# Patient Record
Sex: Male | Born: 1965
Health system: Southern US, Community
[De-identification: ages and names within clinical notes are randomized; demographics above are authoritative.]

## PROBLEM LIST (undated history)

## (undated) DIAGNOSIS — I161 Hypertensive emergency: Secondary | ICD-10-CM

## (undated) DIAGNOSIS — D352 Benign neoplasm of pituitary gland: Secondary | ICD-10-CM

## (undated) DIAGNOSIS — E669 Obesity, unspecified: Secondary | ICD-10-CM

## (undated) DIAGNOSIS — E118 Type 2 diabetes mellitus with unspecified complications: Secondary | ICD-10-CM

## (undated) DIAGNOSIS — I428 Other cardiomyopathies: Secondary | ICD-10-CM

## (undated) DIAGNOSIS — I639 Cerebral infarction, unspecified: Secondary | ICD-10-CM

## (undated) DIAGNOSIS — I509 Heart failure, unspecified: Secondary | ICD-10-CM

## (undated) DIAGNOSIS — E220 Acromegaly and pituitary gigantism: Secondary | ICD-10-CM

## (undated) DIAGNOSIS — I5020 Unspecified systolic (congestive) heart failure: Secondary | ICD-10-CM

## (undated) DIAGNOSIS — I10 Essential (primary) hypertension: Secondary | ICD-10-CM

## (undated) HISTORY — DX: Unspecified systolic (congestive) heart failure: I50.20

## (undated) HISTORY — DX: Obesity, unspecified: E66.9

## (undated) HISTORY — DX: Essential (primary) hypertension: I10

## (undated) HISTORY — DX: Cerebral infarction, unspecified: I63.9

## (undated) HISTORY — DX: Type 2 diabetes mellitus with unspecified complications: E11.8

---
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
## 2011-10-18 DIAGNOSIS — E119 Type 2 diabetes mellitus without complications: Secondary | ICD-10-CM | POA: Insufficient documentation

## 2011-10-18 DIAGNOSIS — E669 Obesity, unspecified: Secondary | ICD-10-CM | POA: Insufficient documentation

## 2012-06-10 DIAGNOSIS — I10 Essential (primary) hypertension: Secondary | ICD-10-CM | POA: Insufficient documentation

## 2017-08-21 DIAGNOSIS — R5383 Other fatigue: Secondary | ICD-10-CM | POA: Diagnosis not present

## 2017-08-21 DIAGNOSIS — R972 Elevated prostate specific antigen [PSA]: Secondary | ICD-10-CM | POA: Diagnosis not present

## 2017-08-21 DIAGNOSIS — E785 Hyperlipidemia, unspecified: Secondary | ICD-10-CM | POA: Diagnosis not present

## 2017-08-21 DIAGNOSIS — E119 Type 2 diabetes mellitus without complications: Secondary | ICD-10-CM | POA: Diagnosis not present

## 2017-08-21 DIAGNOSIS — I10 Essential (primary) hypertension: Secondary | ICD-10-CM | POA: Diagnosis not present

## 2017-12-27 DIAGNOSIS — L570 Actinic keratosis: Secondary | ICD-10-CM | POA: Diagnosis not present

## 2018-09-20 DIAGNOSIS — E119 Type 2 diabetes mellitus without complications: Secondary | ICD-10-CM | POA: Diagnosis not present

## 2018-12-15 ENCOUNTER — Encounter (HOSPITAL_COMMUNITY): Payer: Self-pay | Admitting: Emergency Medicine

## 2018-12-15 ENCOUNTER — Encounter (HOSPITAL_COMMUNITY)
Admission: EM | Disposition: A | Discharge status: Home / Self Care | Payer: Self-pay | Source: Home / Self Care | Attending: Internal Medicine

## 2018-12-15 ENCOUNTER — Other Ambulatory Visit: Payer: Self-pay

## 2018-12-15 ENCOUNTER — Emergency Department (HOSPITAL_COMMUNITY): Payer: BC Managed Care – PPO

## 2018-12-15 ENCOUNTER — Inpatient Hospital Stay (HOSPITAL_COMMUNITY): Payer: BC Managed Care – PPO

## 2018-12-15 ENCOUNTER — Inpatient Hospital Stay (HOSPITAL_COMMUNITY)
Admission: EM | Admit: 2018-12-15 | Discharge: 2018-12-17 | DRG: 286 | Disposition: A | Discharge status: Home / Self Care | Payer: BC Managed Care – PPO | Attending: Cardiovascular Disease | Admitting: Cardiovascular Disease

## 2018-12-15 DIAGNOSIS — I428 Other cardiomyopathies: Secondary | ICD-10-CM | POA: Diagnosis not present

## 2018-12-15 DIAGNOSIS — I110 Hypertensive heart disease with heart failure: Secondary | ICD-10-CM | POA: Diagnosis present

## 2018-12-15 DIAGNOSIS — R9431 Abnormal electrocardiogram [ECG] [EKG]: Secondary | ICD-10-CM

## 2018-12-15 DIAGNOSIS — I493 Ventricular premature depolarization: Secondary | ICD-10-CM | POA: Diagnosis not present

## 2018-12-15 DIAGNOSIS — I351 Nonrheumatic aortic (valve) insufficiency: Secondary | ICD-10-CM | POA: Diagnosis present

## 2018-12-15 DIAGNOSIS — I447 Left bundle-branch block, unspecified: Secondary | ICD-10-CM | POA: Diagnosis present

## 2018-12-15 DIAGNOSIS — R0902 Hypoxemia: Secondary | ICD-10-CM | POA: Diagnosis not present

## 2018-12-15 DIAGNOSIS — R05 Cough: Secondary | ICD-10-CM | POA: Diagnosis not present

## 2018-12-15 DIAGNOSIS — Z8249 Family history of ischemic heart disease and other diseases of the circulatory system: Secondary | ICD-10-CM | POA: Diagnosis not present

## 2018-12-15 DIAGNOSIS — R069 Unspecified abnormalities of breathing: Secondary | ICD-10-CM | POA: Diagnosis not present

## 2018-12-15 DIAGNOSIS — I5021 Acute systolic (congestive) heart failure: Secondary | ICD-10-CM | POA: Diagnosis present

## 2018-12-15 DIAGNOSIS — I161 Hypertensive emergency: Secondary | ICD-10-CM

## 2018-12-15 DIAGNOSIS — I213 ST elevation (STEMI) myocardial infarction of unspecified site: Secondary | ICD-10-CM | POA: Diagnosis not present

## 2018-12-15 DIAGNOSIS — J810 Acute pulmonary edema: Secondary | ICD-10-CM | POA: Diagnosis not present

## 2018-12-15 DIAGNOSIS — Z20828 Contact with and (suspected) exposure to other viral communicable diseases: Secondary | ICD-10-CM | POA: Diagnosis not present

## 2018-12-15 DIAGNOSIS — R0602 Shortness of breath: Secondary | ICD-10-CM | POA: Diagnosis not present

## 2018-12-15 DIAGNOSIS — T68XXXA Hypothermia, initial encounter: Secondary | ICD-10-CM | POA: Diagnosis not present

## 2018-12-15 DIAGNOSIS — I10 Essential (primary) hypertension: Secondary | ICD-10-CM | POA: Diagnosis not present

## 2018-12-15 DIAGNOSIS — I5022 Chronic systolic (congestive) heart failure: Secondary | ICD-10-CM

## 2018-12-15 DIAGNOSIS — Z03818 Encounter for observation for suspected exposure to other biological agents ruled out: Secondary | ICD-10-CM | POA: Diagnosis not present

## 2018-12-15 DIAGNOSIS — Z1159 Encounter for screening for other viral diseases: Secondary | ICD-10-CM

## 2018-12-15 DIAGNOSIS — E1165 Type 2 diabetes mellitus with hyperglycemia: Secondary | ICD-10-CM | POA: Diagnosis not present

## 2018-12-15 DIAGNOSIS — Z87891 Personal history of nicotine dependence: Secondary | ICD-10-CM

## 2018-12-15 DIAGNOSIS — I214 Non-ST elevation (NSTEMI) myocardial infarction: Secondary | ICD-10-CM | POA: Diagnosis not present

## 2018-12-15 DIAGNOSIS — Z7984 Long term (current) use of oral hypoglycemic drugs: Secondary | ICD-10-CM | POA: Diagnosis not present

## 2018-12-15 DIAGNOSIS — R000 Tachycardia, unspecified: Secondary | ICD-10-CM | POA: Diagnosis not present

## 2018-12-15 HISTORY — DX: Hypertensive emergency: I16.1

## 2018-12-15 LAB — CBC WITH DIFFERENTIAL/PLATELET
Abs Immature Granulocytes: 0.03 10*3/uL (ref 0.00–0.07)
Basophils Absolute: 0 10*3/uL (ref 0.0–0.1)
Basophils Relative: 0 %
Eosinophils Absolute: 0 10*3/uL (ref 0.0–0.5)
Eosinophils Relative: 0 %
HCT: 47.1 % (ref 39.0–52.0)
Hemoglobin: 15.6 g/dL (ref 13.0–17.0)
Immature Granulocytes: 0 %
Lymphocytes Relative: 12 %
Lymphs Abs: 1.4 10*3/uL (ref 0.7–4.0)
MCH: 30.4 pg (ref 26.0–34.0)
MCHC: 33.1 g/dL (ref 30.0–36.0)
MCV: 91.6 fL (ref 80.0–100.0)
Monocytes Absolute: 0.7 10*3/uL (ref 0.1–1.0)
Monocytes Relative: 6 %
Neutro Abs: 9.2 10*3/uL — ABNORMAL HIGH (ref 1.7–7.7)
Neutrophils Relative %: 82 %
Platelets: 203 10*3/uL (ref 150–400)
RBC: 5.14 MIL/uL (ref 4.22–5.81)
RDW: 12.7 % (ref 11.5–15.5)
WBC: 11.4 10*3/uL — ABNORMAL HIGH (ref 4.0–10.5)
nRBC: 0 % (ref 0.0–0.2)

## 2018-12-15 LAB — COMPREHENSIVE METABOLIC PANEL
ALT: 22 U/L (ref 0–44)
AST: 22 U/L (ref 15–41)
Albumin: 3.7 g/dL (ref 3.5–5.0)
Alkaline Phosphatase: 98 U/L (ref 38–126)
Anion gap: 13 (ref 5–15)
BUN: 9 mg/dL (ref 6–20)
CO2: 24 mmol/L (ref 22–32)
Calcium: 9.3 mg/dL (ref 8.9–10.3)
Chloride: 99 mmol/L (ref 98–111)
Creatinine, Ser: 1.07 mg/dL (ref 0.61–1.24)
GFR calc Af Amer: >60 mL/min (ref >60)
GFR calc non Af Amer: >60 mL/min (ref >60)
Glucose, Bld: 411 mg/dL — ABNORMAL HIGH (ref 70–99)
Potassium: 4.5 mmol/L (ref 3.5–5.1)
Sodium: 136 mmol/L (ref 135–145)
Total Bilirubin: 0.8 mg/dL (ref 0.3–1.2)
Total Protein: 6.8 g/dL (ref 6.5–8.1)

## 2018-12-15 LAB — APTT: aPTT: 31 seconds (ref 24–36)

## 2018-12-15 LAB — ECHOCARDIOGRAM COMPLETE
Height: 75 in
Weight: 3746.06 oz

## 2018-12-15 LAB — HEMOGLOBIN A1C
Hgb A1c MFr Bld: 12.7 % — ABNORMAL HIGH (ref 4.8–5.6)
Mean Plasma Glucose: 317.79 mg/dL

## 2018-12-15 LAB — TROPONIN I (HIGH SENSITIVITY)
Troponin I (High Sensitivity): 55 ng/L — ABNORMAL HIGH (ref <18)
Troponin I (High Sensitivity): 102 ng/L (ref <18)

## 2018-12-15 LAB — PROTIME-INR
INR: 0.9 (ref 0.8–1.2)
Prothrombin Time: 12.5 seconds (ref 11.4–15.2)

## 2018-12-15 LAB — LIPID PANEL
Cholesterol: 192 mg/dL (ref 0–200)
HDL: 52 mg/dL (ref >40)
LDL Cholesterol: 81 mg/dL (ref 0–99)
Total CHOL/HDL Ratio: 3.7 RATIO
Triglycerides: 293 mg/dL — ABNORMAL HIGH (ref <150)
VLDL: 59 mg/dL — ABNORMAL HIGH (ref 0–40)

## 2018-12-15 LAB — GLUCOSE, CAPILLARY
Glucose-Capillary: 386 mg/dL — ABNORMAL HIGH (ref 70–99)
Glucose-Capillary: 312 mg/dL — ABNORMAL HIGH (ref 70–99)
Glucose-Capillary: 350 mg/dL — ABNORMAL HIGH (ref 70–99)
Glucose-Capillary: 292 mg/dL — ABNORMAL HIGH (ref 70–99)

## 2018-12-15 LAB — MRSA PCR SCREENING: MRSA by PCR: NEGATIVE

## 2018-12-15 LAB — BRAIN NATRIURETIC PEPTIDE: B Natriuretic Peptide: 236 pg/mL — ABNORMAL HIGH (ref 0.0–100.0)

## 2018-12-15 LAB — HIV ANTIBODY (ROUTINE TESTING W REFLEX): HIV Screen 4th Generation wRfx: NONREACTIVE

## 2018-12-15 LAB — MAGNESIUM: Magnesium: 1.4 mg/dL — ABNORMAL LOW (ref 1.7–2.4)

## 2018-12-15 LAB — TSH: TSH: 0.789 u[IU]/mL (ref 0.350–4.500)

## 2018-12-15 LAB — SARS CORONAVIRUS 2 BY RT PCR (HOSPITAL ORDER, PERFORMED IN ~~LOC~~ HOSPITAL LAB): SARS Coronavirus 2: NEGATIVE

## 2018-12-15 SURGERY — INVASIVE LAB ABORTED CASE

## 2018-12-15 MED ORDER — ONDANSETRON HCL 4 MG/2ML IJ SOLN: 4.0000 mg | Freq: Four times a day (QID) | INTRAMUSCULAR | Status: DC | PRN

## 2018-12-15 MED ORDER — INSULIN ASPART 100 UNIT/ML ~~LOC~~ SOLN
0.0000 [IU] | Freq: Every day | SUBCUTANEOUS | Status: DC
  Administered 2018-12-15: 3 [IU] via SUBCUTANEOUS
  Administered 2018-12-16: 2 [IU] via SUBCUTANEOUS

## 2018-12-15 MED ORDER — NITROGLYCERIN IN D5W 200-5 MCG/ML-% IV SOLN
INTRAVENOUS | Status: AC
  Filled 2018-12-15: qty 250

## 2018-12-15 MED ORDER — GLIPIZIDE 10 MG PO TABS
10.0000 mg | ORAL_TABLET | Freq: Two times a day (BID) | ORAL | Status: DC
  Administered 2018-12-15 – 2018-12-17 (×4): 10 mg via ORAL
  Filled 2018-12-15 (×5): qty 1

## 2018-12-15 MED ORDER — SODIUM CHLORIDE 0.9% FLUSH
3.0000 mL | Freq: Two times a day (BID) | INTRAVENOUS | Status: DC
  Administered 2018-12-15 – 2018-12-16 (×2): 3 mL via INTRAVENOUS

## 2018-12-15 MED ORDER — SODIUM CHLORIDE 0.9% FLUSH
3.0000 mL | Freq: Two times a day (BID) | INTRAVENOUS | Status: DC
  Administered 2018-12-15: 3 mL via INTRAVENOUS

## 2018-12-15 MED ORDER — SODIUM CHLORIDE 0.9 % IV SOLN: 250.0000 mL | INTRAVENOUS | Status: DC | PRN

## 2018-12-15 MED ORDER — HEPARIN (PORCINE) IN NACL 1000-0.9 UT/500ML-% IV SOLN
INTRAVENOUS | Status: AC
  Filled 2018-12-15: qty 1000

## 2018-12-15 MED ORDER — SODIUM CHLORIDE 0.9 % IV SOLN
INTRAVENOUS | Status: DC
  Administered 2018-12-16: 05:00:00 via INTRAVENOUS

## 2018-12-15 MED ORDER — LIDOCAINE HCL (PF) 1 % IJ SOLN
INTRAMUSCULAR | Status: AC
  Filled 2018-12-15: qty 30

## 2018-12-15 MED ORDER — MAGNESIUM SULFATE 4 GM/100ML IV SOLN
4.0000 g | Freq: Once | INTRAVENOUS | Status: AC
  Administered 2018-12-15: 4 g via INTRAVENOUS
  Filled 2018-12-15: qty 100

## 2018-12-15 MED ORDER — NITROGLYCERIN IN D5W 200-5 MCG/ML-% IV SOLN
0.0000 ug/min | INTRAVENOUS | Status: DC
  Administered 2018-12-15: 20 ug/min via INTRAVENOUS

## 2018-12-15 MED ORDER — SACUBITRIL-VALSARTAN 49-51 MG PO TABS
1.0000 | ORAL_TABLET | Freq: Two times a day (BID) | ORAL | Status: DC
  Administered 2018-12-15 – 2018-12-17 (×5): 1 via ORAL
  Filled 2018-12-15 (×6): qty 1

## 2018-12-15 MED ORDER — FUROSEMIDE 10 MG/ML IJ SOLN
40.0000 mg | Freq: Two times a day (BID) | INTRAMUSCULAR | Status: DC
  Administered 2018-12-15: 40 mg via INTRAVENOUS
  Filled 2018-12-15: qty 4

## 2018-12-15 MED ORDER — SODIUM CHLORIDE 0.9% FLUSH: 3.0000 mL | INTRAVENOUS | Status: DC | PRN

## 2018-12-15 MED ORDER — ATORVASTATIN CALCIUM 80 MG PO TABS
80.0000 mg | ORAL_TABLET | Freq: Every day | ORAL | Status: DC
  Administered 2018-12-15 – 2018-12-16 (×2): 80 mg via ORAL
  Filled 2018-12-15 (×2): qty 1

## 2018-12-15 MED ORDER — ASPIRIN 81 MG PO CHEW
81.0000 mg | CHEWABLE_TABLET | ORAL | Status: AC
  Administered 2018-12-16: 81 mg via ORAL
  Filled 2018-12-15: qty 1

## 2018-12-15 MED ORDER — SPIRONOLACTONE 12.5 MG HALF TABLET
12.5000 mg | ORAL_TABLET | Freq: Every day | ORAL | Status: DC
  Administered 2018-12-15 – 2018-12-17 (×3): 12.5 mg via ORAL
  Filled 2018-12-15 (×3): qty 1

## 2018-12-15 MED ORDER — ACETAMINOPHEN 325 MG PO TABS: 650.0000 mg | ORAL_TABLET | ORAL | Status: DC | PRN

## 2018-12-15 MED ORDER — INSULIN ASPART 100 UNIT/ML ~~LOC~~ SOLN
0.0000 [IU] | Freq: Three times a day (TID) | SUBCUTANEOUS | Status: DC
  Administered 2018-12-15 – 2018-12-16 (×5): 11 [IU] via SUBCUTANEOUS
  Administered 2018-12-17 (×2): 8 [IU] via SUBCUTANEOUS

## 2018-12-15 MED ORDER — HEPARIN SODIUM (PORCINE) 5000 UNIT/ML IJ SOLN
5000.0000 [IU] | Freq: Three times a day (TID) | INTRAMUSCULAR | Status: DC
  Administered 2018-12-15 – 2018-12-17 (×6): 5000 [IU] via SUBCUTANEOUS
  Filled 2018-12-15 (×6): qty 1

## 2018-12-15 MED ORDER — VERAPAMIL HCL 2.5 MG/ML IV SOLN
INTRAVENOUS | Status: AC
  Filled 2018-12-15: qty 2

## 2018-12-15 SURGICAL SUPPLY — 9 items
CATH 5FR JL3.5 JR4 ANG PIG MP (CATHETERS) ×4 IMPLANT
GLIDESHEATH SLEND SS 6F .021 (SHEATH) ×4 IMPLANT
GUIDEWIRE INQWIRE 1.5J.035X260 (WIRE) ×2 IMPLANT
INQWIRE 1.5J .035X260CM (WIRE) ×4
KIT ENCORE 26 ADVANTAGE (KITS) ×4 IMPLANT
KIT HEART LEFT (KITS) ×4 IMPLANT
PACK CARDIAC CATHETERIZATION (CUSTOM PROCEDURE TRAY) ×4 IMPLANT
TRANSDUCER W/STOPCOCK (MISCELLANEOUS) ×4 IMPLANT
TUBING CIL FLEX 10 FLL-RA (TUBING) ×4 IMPLANT

## 2018-12-15 NOTE — Progress Notes (Addendum)
   Notes from earlier this am reviewed.   He feels good this am. On NTG gtt at 20. SBP 130s. No CP or SOB. HsTrop. Just minimally elevated.   Echo reviewed personally and EF ~30% with multiple RWMA including inferior AK. Suspect multi-vessel CAD  Plan as follows:  1) Start entresto 49/51 2) Wean NTG to off -> goal SBP 125-145 3) Start statin and spiro 4) Cover with SSI 5) Continue lasix one more day 6) supp mag 7) Plan R/L cath tomorrow with Dr. Burt Knack (orders written) 8) Start b-blocker as tolerated 9) Consider SGLT2i on d/c 10) Can go to SDU later in day.   The AHF team is happy to help manage as needed.   Glori Bickers, MD  10:45 AM

## 2018-12-15 NOTE — ED Provider Notes (Signed)
[facility] EMERGENCY DEPARTMENT Provider Note   CSN: [id number] Arrival date & time: 12/15/18  4627     History   Chief Complaint Chief Complaint  Patient presents with  . Code STEMI    HPI Orie Baxendale is a 53 y.o. male.     Patient is a 53 year old male with history of diabetes and hypertension.  He presents today for evaluation of difficulty breathing.  The patient states he was home this evening when he developed sudden onset of shortness of breath.  He became sweaty and presented to [facility] for evaluation.  Work-up there was initiated showing a negative troponin, but concerns on his EKG for possible cardiac issue.  He was also markedly hypertensive.  Patient was then transferred here for cardiology evaluation.  He arrived here as a code STEMI.  Patient denies to me he is experiencing any chest pain.  He continues to feel short of breath and is diaphoretic.  The history is provided by the patient.    Past Medical History:  Diagnosis Date  . Diabetes mellitus without complication ([place])     There are no active problems to display for this patient.   History reviewed. No pertinent surgical history.      Home Medications    Prior to Admission medications   Not on File    Family History No family history on file.  Social History Social History   Tobacco Use  . Smoking status: Not on file  Substance Use Topics  . Alcohol use: Not on file  . Drug use: Not on file     Allergies   Patient has no known allergies.   Review of Systems Review of Systems  All other systems reviewed and are negative.    Physical Exam Updated Vital Signs BP (!) 150/107 (BP Location: Right Arm)   Pulse (!) 116   Temp 99.1 F (37.3 C) (Oral)   Resp 16   Ht 6\' 3"  (1.905 m)   Wt 100.7 kg   BMI 27.75 kg/m   Physical Exam Vitals signs and nursing note reviewed.  Constitutional:      General: He is not in acute distress.    Appearance: He  is well-developed. He is diaphoretic. He is not ill-appearing.  HENT:     Head: Normocephalic and atraumatic.  Neck:     Musculoskeletal: Normal range of motion and neck supple.  Cardiovascular:     Rate and Rhythm: Normal rate and regular rhythm.     Heart sounds: No murmur. No friction rub.  Pulmonary:     Effort: Pulmonary effort is normal. No respiratory distress.     Breath sounds: Normal breath sounds. No wheezing or rales.  Abdominal:     General: Bowel sounds are normal. There is no distension.     Palpations: Abdomen is soft.     Tenderness: There is no abdominal tenderness.  Musculoskeletal: Normal range of motion.        General: No swelling or tenderness.     Right lower leg: No edema.     Left lower leg: No edema.  Skin:    General: Skin is warm.  Neurological:     Mental Status: He is alert and oriented to person, place, and time.     Coordination: Coordination normal.      ED Treatments / Results  Labs (all labs ordered are listed, but only abnormal results are displayed) Labs Reviewed  COMPREHENSIVE METABOLIC PANEL  CBC  WITH DIFFERENTIAL/PLATELET  TROPONIN I (HIGH SENSITIVITY)  TROPONIN I (HIGH SENSITIVITY)  BRAIN NATRIURETIC PEPTIDE    EKG EKG Interpretation  Date/Time:  Sunday December 15 2018 03:47:34 EDT Ventricular Rate:  118 PR Interval:    QRS Duration: 152 QT Interval:  358 QTC Calculation: 502 R Axis:   27 Text Interpretation:  Sinus tachycardia Ventricular trigeminy Consider right atrial enlargement Left bundle branch block Confirmed by Veryl Speak [phone]) on 12/15/2018 3:57:16 AM   Radiology No results found.  Procedures Procedures (including critical care time)  Medications Ordered in ED Medications  nitroGLYCERIN 50 mg in dextrose 5 % 250 mL (0.2 mg/mL) infusion (has no administration in time range)  nitroGLYCERIN 0.2 mg/mL in dextrose 5 % infusion (has no administration in time range)  lidocaine (PF) (XYLOCAINE) 1 % injection  (has no administration in time range)  Heparin (Porcine) in NaCl 1000-0.9 UT/500ML-% SOLN (has no administration in time range)  verapamil (ISOPTIN) 2.5 MG/ML injection (has no administration in time range)     Initial Impression / Assessment and Plan / ED Course  I have reviewed the triage vital signs and the nursing notes.  Pertinent labs & imaging results that were available during my care of the patient were reviewed by me and considered in my medical decision making (see chart for details).  Patient sent here for shortness of breath and elevated blood pressure.  This occurred abruptly this evening.  He was seen at [facility] and then sent here for possible STEMI.  Patient evaluated by cardiology here in the ER and will admit the patient.  They reviewed the EKG and clinical presentation and do not feel as though this warrants immediate Cath Lab.  Patient will be admitted to the cardiologist service for further treatment.   Final Clinical Impressions(s) / ED Diagnoses   Final diagnoses:  STEMI (ST elevation myocardial infarction) [facility])    ED Discharge Orders    None       Veryl Speak, MD 12/21/18 1506

## 2018-12-15 NOTE — H&P (Signed)
Cardiology History and Physical Note   PCP:  No primary care provider on file.  PCP-Cardiology: No primary care provider on file.     Reason for Admission: Hypertensive emergency, acute pulmonary edema and CHF   HPI:   [name] is a 53 year old male with a history of diabetes not requiring insulin, hypertension who was transferred from [facility] rocking him for evaluation of hypertensive emergency acute pulmonary edema with heart failure and possible STEMI.  However EKG shows left bundle branch block and not consistent with STEMI.  STEMI activation was canceled.  He was given IV Lasix and started on nitroglycerin drip in the ER with improvement in dyspnea.  He is overall feeling much better.  He denies having any prior cardiac history.  About 1 month ago he started to notice some mild shortness of breath at home which seem to be worse with exertion but overall did not bother him too much.  However about 1 week ago the shortness of breath increased in severity and he has had difficulty breathing with almost any activity and difficulty lying flat.  He denies significant leg swelling.  He denies chest pain. He denies having symptoms like this before.  He has a history of hypertension that was treated with lisinopril a few years ago however he said that he was able to stop the lisinopril because the blood pressure got too low and he did not need the medicine.  He has never required insulin for his diabetes.  He takes metformin and glipizide.  He says his mother had a heart attack in the past but otherwise denies any significant cardiac family history.  He smoked rarely as a teenager but is otherwise a non-smoker. He notes significant weight loss of about 80 pounds over the past 1 to 2 years associated with developing diabetes.  Review of Systems: [y] = yes, [ ]  = no   General: Weight gain [ ] ; Weight loss [yes]; Anorexia [ ] ; Fatigue [ ] ; Fever [ ] ; Chills [ ] ; Weakness [ ]   Cardiac: Chest  pain/pressure [ ] ; Resting SOB [yes]; Exertional SOB [yes]; Orthopnea [yes]; Pedal Edema [ ] ; Palpitations [ ] ; Syncope [ ] ; Presyncope [ ] ; Paroxysmal nocturnal dyspnea[ ]   Pulmonary: Cough [ ] ; Wheezing[ ] ; Hemoptysis[ ] ; Sputum [ ] ; Snoring [ ]   GI: Vomiting[ ] ; Dysphagia[ ] ; Melena[ ] ; Hematochezia [ ] ; Heartburn[ ] ; Abdominal pain [ ] ; Constipation [ ] ; Diarrhea [ ] ; BRBPR [ ]   GU: Hematuria[ ] ; Dysuria [ ] ; Nocturia[ ]   Vascular: Pain in legs with walking [ ] ; Pain in feet with lying flat [ ] ; Non-healing sores [ ] ; Stroke [ ] ; TIA [ ] ; Slurred speech [ ] ;  Neuro: Headaches[ ] ; Vertigo[ ] ; Seizures[ ] ; Paresthesias[ ] ;Blurred vision [ ] ; Diplopia [ ] ; Vision changes [ ]   Ortho/Skin: Arthritis [ ] ; Joint pain [ ] ; Muscle pain [ ] ; Joint swelling [ ] ; Back Pain [ ] ; Rash [ ]   Psych: Depression[ ] ; Anxiety[ ]   Heme: Bleeding problems [ ] ; Clotting disorders [ ] ; Anemia [ ]   Endocrine: Diabetes [ ] ; Thyroid dysfunction[ ]    Home Medications Prior to Admission medications   Not on File    Past Medical History: Past Medical History:  Diagnosis Date  . Diabetes mellitus without complication [facility])     Past Surgical History: History reviewed. No pertinent surgical history.  Family History: His mother had a heart attack in the past No family history on file.  Social History: Social  History   Socioeconomic History  . Marital status: Not on file    Spouse name: Not on file  . Number of children: Not on file  . Years of education: Not on file  . Highest education level: Not on file  Occupational History  . Not on file  Social Needs  . Financial resource strain: Not on file  . Food insecurity    Worry: Not on file    Inability: Not on file  . Transportation needs    Medical: Not on file    Non-medical: Not on file  Tobacco Use  . Smoking status: Not on file  Substance and Sexual Activity  . Alcohol use: Not on file  . Drug use: Not on file  . Sexual activity: Not on file   Lifestyle  . Physical activity    Days per week: Not on file    Minutes per session: Not on file  . Stress: Not on file  Relationships  . Social Herbalist on phone: Not on file    Gets together: Not on file    Attends religious service: Not on file    Active member of club or organization: Not on file    Attends meetings of clubs or organizations: Not on file    Relationship status: Not on file  Other Topics Concern  . Not on file  Social History Narrative  . Not on file    Allergies:  No Known Allergies  Objective:    Vital Signs:   Temp:  [99.1 F (37.3 C)] 99.1 F (37.3 C) (07/05 0343) Pulse Rate:  [115-120] 115 (07/05 0400) Resp:  [16-32] 22 (07/05 0400) BP: (149-178)/(107-137) 149/137 (07/05 0400) SpO2:  [96 %-97 %] 97 % (07/05 0400) Weight:  [100.7 kg] 100.7 kg (07/05 0344)   Filed Weights   12/15/18 0344  Weight: 100.7 kg     Physical Exam     General: Mild respiratory distress.  Requiring 2 L of nasal cannula oxygen.  Able to speak in full sentences. HEENT: Normal Neck: Supple.  Mild JVD about 2 cm above the clavicle when sitting upright.  Positive hepatojugular reflux. Carotids 2+ bilat; no bruits. No lymphadenopathy or thyromegaly appreciated. Cor: PMI displaced laterally.  Tachycardic with mild irregularity. No rubs, gallops or murmurs. Lungs: Mild diffuse crackles mostly toward the lower lobes Abdomen: Soft, nontender, nondistended. No hepatosplenomegaly. No bruits or masses. Good bowel sounds. Extremities: No cyanosis, clubbing, rash.  Mild bilateral pitting edema of the lower extremities from the ankles to the knees. Neuro: Alert & oriented x 3, cranial nerves grossly intact. moves all 4 extremities w/o difficulty. Affect pleasant.   Telemetry   Sinus tachycardia with left bundle branch block  EKG   Sinus tachycardia with left bundle branch block and narrow complex ventricular trigeminy  Labs     Basic Metabolic Panel: No  results for input(s): NA, K, CL, CO2, GLUCOSE, BUN, CREATININE, CALCIUM, MG, PHOS in the last 168 hours.  Liver Function Tests: No results for input(s): AST, ALT, ALKPHOS, BILITOT, PROT, ALBUMIN in the last 168 hours. No results for input(s): LIPASE, AMYLASE in the last 168 hours. No results for input(s): AMMONIA in the last 168 hours.  CBC: Recent Labs  Lab 12/15/18 0342  WBC 11.4*  NEUTROABS 9.2*  HGB 15.6  HCT 47.1  MCV 91.6  PLT 203    Cardiac Enzymes: No results for input(s): CKTOTAL, CKMB, CKMBINDEX, TROPONINI in the last 168 hours.  BNP:  BNP (last 3 results) No results for input(s): BNP in the last 8760 hours.  ProBNP (last 3 results) No results for input(s): PROBNP in the last 8760 hours.   CBG: No results for input(s): GLUCAP in the last 168 hours.  Coagulation Studies: No results for input(s): LABPROT, INR in the last 72 hours.  Imaging: Dg Chest Port 1 View  Result Date: 12/15/2018 CLINICAL DATA:  53 y/o M; shortness of breath for a few weeks, worse in the past couple of days. EXAM: PORTABLE CHEST 1 VIEW COMPARISON:  12/15/2018 chest radiograph FINDINGS: Stable cardiac silhouette given projection and technique. Aortic atherosclerosis. Increase interstitial opacities again noted. Right hilar opacity appears less masslike on the current study, likely due to differences in patient positioning. No pleural effusion or pneumothorax. No acute osseous abnormality is evident. IMPRESSION: Persistent interstitial opacities may represent interstitial edema or atypical pneumonia. Right hilar opacity appears less masslike on the current study, likely due to differences in patient positioning. Electronically Signed   By: Kristine Garbe M.D.   On: 12/15/2018 04:24      Assessment/Plan   53 year old man with diabetes not on insulin hypertension Admitted for hypertensive emergency, acute CHF pulmonary edema EKG shows sinus tachycardia with left bundle branch block  not diagnostic of STEMI therefore STEMI activation was canceled. Troponin was normal at [facility] Blood pressure was 170s to 180s over 120s to 130s but improving with nitroglycerin drip  We will need to check labs including CBC CMP BNP TSH and troponin Clinical history, exam, chest x-ray demonstrated pulmonary edema and heart failure Check 2D echocardiogram Check lipids, A1c  IV Lasix 40 twice daily and monitor electrolytes.  Continue nitroglycerin drip. Additional treatment pending work-up as above  Heparin for DVT prophylaxis  Tressie Stalker, MD  Cardiology 12/15/2018, 4:35 AM

## 2018-12-15 NOTE — ED Notes (Signed)
Nurse collected labs. 

## 2018-12-15 NOTE — ED Notes (Signed)
Attempted to call report

## 2018-12-15 NOTE — Progress Notes (Signed)
Echocardiogram 2D Echocardiogram has been performed.  Kevin Hampton 12/15/2018, 8:43 AM

## 2018-12-15 NOTE — Progress Notes (Signed)
CRITICAL VALUE ALERT  Critical Value:  Troponin 102   Date & Time Notied: 12/15/2018  Orders Received/Actions taken: Expected Value. Will notify on AM rounds.

## 2018-12-15 NOTE — ED Notes (Signed)
[place] pts wife

## 2018-12-15 NOTE — ED Notes (Signed)
W/ pt's permission, RN called and updated family - Wife Everlene Farrier [phone]

## 2018-12-15 NOTE — ED Triage Notes (Signed)
From [facility], reports SOB for a few weeks but worse the past couple of days.  No chest pain at all  Given: 324 ASA 5000 Hep 3 sublingual nitro Inch of nitro paste 40 mg lasix  Sent to ED as a code STEMI.

## 2018-12-15 NOTE — Progress Notes (Signed)
Interventional Note: Pt transferred from [facility] as Code STEMI. 53 yo diabetic male presenting with progressive dyspnea and orthopnea/cough. Noted to have LBBB with anteroseptal STE on EKG.   On arrival, the patient is CP-free and denies having any CP symptoms. EKG shows sinus tach with PVC's, discordant STE but not diagnostic of STEMI, HR 120 bpm. Trop <0.01 at [facility] despite several days of symptoms. Discussed with Dr Stark Jock (EDP) and Dr Ulysees Barns (cardiology fellow) who will further evaluate patient for admission and treatment. Code STEMI cancelled.  Sherren Mocha 12/15/2018 4:05 AM

## 2018-12-16 ENCOUNTER — Encounter (HOSPITAL_COMMUNITY)
Admission: EM | Disposition: A | Discharge status: Home / Self Care | Payer: Self-pay | Source: Home / Self Care | Attending: Internal Medicine

## 2018-12-16 ENCOUNTER — Encounter (HOSPITAL_COMMUNITY): Payer: Self-pay | Admitting: Cardiovascular Disease

## 2018-12-16 DIAGNOSIS — I5023 Acute on chronic systolic (congestive) heart failure: Secondary | ICD-10-CM

## 2018-12-16 HISTORY — PX: RIGHT/LEFT HEART CATH AND CORONARY ANGIOGRAPHY: CATH118266

## 2018-12-16 LAB — POCT I-STAT 7, (LYTES, BLD GAS, ICA,H+H)
Acid-Base Excess: 2 mmol/L (ref 0.0–2.0)
Bicarbonate: 26.8 mmol/L (ref 20.0–28.0)
Calcium, Ion: 1.2 mmol/L (ref 1.15–1.40)
HCT: 49 % (ref 39.0–52.0)
Hemoglobin: 16.7 g/dL (ref 13.0–17.0)
O2 Saturation: 92 %
Potassium: 3.8 mmol/L (ref 3.5–5.1)
Sodium: 138 mmol/L (ref 135–145)
TCO2: 28 mmol/L (ref 22–32)
pCO2 arterial: 40.9 mmHg (ref 32.0–48.0)
pH, Arterial: 7.425 (ref 7.350–7.450)
pO2, Arterial: 63 mmHg — ABNORMAL LOW (ref 83.0–108.0)

## 2018-12-16 LAB — GLUCOSE, CAPILLARY
Glucose-Capillary: 321 mg/dL — ABNORMAL HIGH (ref 70–99)
Glucose-Capillary: 321 mg/dL — ABNORMAL HIGH (ref 70–99)
Glucose-Capillary: 282 mg/dL — ABNORMAL HIGH (ref 70–99)
Glucose-Capillary: 321 mg/dL — ABNORMAL HIGH (ref 70–99)
Glucose-Capillary: 244 mg/dL — ABNORMAL HIGH (ref 70–99)

## 2018-12-16 LAB — POCT I-STAT EG7
Acid-Base Excess: 3 mmol/L — ABNORMAL HIGH (ref 0.0–2.0)
Acid-Base Excess: 4 mmol/L — ABNORMAL HIGH (ref 0.0–2.0)
Bicarbonate: 29 mmol/L — ABNORMAL HIGH (ref 20.0–28.0)
Bicarbonate: 30.3 mmol/L — ABNORMAL HIGH (ref 20.0–28.0)
Calcium, Ion: 1.2 mmol/L (ref 1.15–1.40)
Calcium, Ion: 1.22 mmol/L (ref 1.15–1.40)
HCT: 48 % (ref 39.0–52.0)
HCT: 50 % (ref 39.0–52.0)
Hemoglobin: 16.3 g/dL (ref 13.0–17.0)
Hemoglobin: 17 g/dL (ref 13.0–17.0)
O2 Saturation: 63 %
O2 Saturation: 65 %
Potassium: 3.8 mmol/L (ref 3.5–5.1)
Potassium: 3.8 mmol/L (ref 3.5–5.1)
Sodium: 138 mmol/L (ref 135–145)
Sodium: 138 mmol/L (ref 135–145)
TCO2: 30 mmol/L (ref 22–32)
TCO2: 32 mmol/L (ref 22–32)
pCO2, Ven: 47.8 mmHg (ref 44.0–60.0)
pCO2, Ven: 48.4 mmHg (ref 44.0–60.0)
pH, Ven: 7.391 (ref 7.250–7.430)
pH, Ven: 7.405 (ref 7.250–7.430)
pO2, Ven: 34 mmHg (ref 32.0–45.0)
pO2, Ven: 34 mmHg (ref 32.0–45.0)

## 2018-12-16 LAB — BASIC METABOLIC PANEL
Anion gap: 13 (ref 5–15)
BUN: 14 mg/dL (ref 6–20)
CO2: 28 mmol/L (ref 22–32)
Calcium: 9.2 mg/dL (ref 8.9–10.3)
Chloride: 97 mmol/L — ABNORMAL LOW (ref 98–111)
Creatinine, Ser: 1.08 mg/dL (ref 0.61–1.24)
GFR calc Af Amer: >60 mL/min (ref >60)
GFR calc non Af Amer: >60 mL/min (ref >60)
Glucose, Bld: 311 mg/dL — ABNORMAL HIGH (ref 70–99)
Potassium: 3.8 mmol/L (ref 3.5–5.1)
Sodium: 138 mmol/L (ref 135–145)

## 2018-12-16 LAB — MAGNESIUM: Magnesium: 1.7 mg/dL (ref 1.7–2.4)

## 2018-12-16 SURGERY — RIGHT/LEFT HEART CATH AND CORONARY ANGIOGRAPHY
Anesthesia: LOCAL

## 2018-12-16 MED ORDER — LIDOCAINE HCL (PF) 1 % IJ SOLN
INTRAMUSCULAR | Status: DC | PRN
  Administered 2018-12-16 (×2): 2 mL

## 2018-12-16 MED ORDER — ACETAMINOPHEN 325 MG PO TABS: 650.0000 mg | ORAL_TABLET | ORAL | Status: DC | PRN

## 2018-12-16 MED ORDER — HEPARIN (PORCINE) IN NACL 1000-0.9 UT/500ML-% IV SOLN
INTRAVENOUS | Status: AC
  Filled 2018-12-16: qty 500

## 2018-12-16 MED ORDER — ASPIRIN 81 MG PO CHEW: 81.0000 mg | CHEWABLE_TABLET | Freq: Every day | ORAL | Status: DC

## 2018-12-16 MED ORDER — CARVEDILOL 3.125 MG PO TABS
3.1250 mg | ORAL_TABLET | Freq: Two times a day (BID) | ORAL | Status: DC
  Administered 2018-12-16 – 2018-12-17 (×3): 3.125 mg via ORAL
  Filled 2018-12-16 (×3): qty 1

## 2018-12-16 MED ORDER — HEPARIN (PORCINE) IN NACL 1000-0.9 UT/500ML-% IV SOLN
INTRAVENOUS | Status: AC
  Filled 2018-12-16: qty 1000

## 2018-12-16 MED ORDER — MIDAZOLAM HCL 2 MG/2ML IJ SOLN
INTRAMUSCULAR | Status: DC | PRN
  Administered 2018-12-16 (×2): 1 mg via INTRAVENOUS

## 2018-12-16 MED ORDER — NITROGLYCERIN 1 MG/10 ML FOR IR/CATH LAB
INTRA_ARTERIAL | Status: AC
  Filled 2018-12-16: qty 10

## 2018-12-16 MED ORDER — MORPHINE SULFATE (PF) 2 MG/ML IV SOLN: 2.0000 mg | INTRAVENOUS | Status: DC | PRN

## 2018-12-16 MED ORDER — VERAPAMIL HCL 2.5 MG/ML IV SOLN
INTRAVENOUS | Status: AC
  Filled 2018-12-16: qty 2

## 2018-12-16 MED ORDER — IOHEXOL 350 MG/ML SOLN
INTRAVENOUS | Status: DC | PRN
  Administered 2018-12-16: 75 mL via INTRA_ARTERIAL

## 2018-12-16 MED ORDER — SODIUM CHLORIDE 0.9% FLUSH
3.0000 mL | Freq: Two times a day (BID) | INTRAVENOUS | Status: DC
  Administered 2018-12-17: 3 mL via INTRAVENOUS

## 2018-12-16 MED ORDER — CHLORHEXIDINE GLUCONATE CLOTH 2 % EX PADS
6.0000 | MEDICATED_PAD | Freq: Every day | CUTANEOUS | Status: DC
  Administered 2018-12-17: 6 via TOPICAL

## 2018-12-16 MED ORDER — FENTANYL CITRATE (PF) 100 MCG/2ML IJ SOLN
INTRAMUSCULAR | Status: DC | PRN
  Administered 2018-12-16 (×2): 25 ug via INTRAVENOUS

## 2018-12-16 MED ORDER — LABETALOL HCL 5 MG/ML IV SOLN
10.0000 mg | INTRAVENOUS | Status: AC | PRN
  Administered 2018-12-16: 10 mg via INTRAVENOUS
  Filled 2018-12-16: qty 4

## 2018-12-16 MED ORDER — HEPARIN SODIUM (PORCINE) 1000 UNIT/ML IJ SOLN
INTRAMUSCULAR | Status: DC | PRN
  Administered 2018-12-16: 5000 [IU] via INTRAVENOUS

## 2018-12-16 MED ORDER — HYDRALAZINE HCL 20 MG/ML IJ SOLN: 10.0000 mg | INTRAMUSCULAR | Status: AC | PRN

## 2018-12-16 MED ORDER — GLIPIZIDE 10 MG PO TABS: 10.0000 mg | ORAL_TABLET | Freq: Two times a day (BID) | ORAL | Status: DC

## 2018-12-16 MED ORDER — SODIUM CHLORIDE 0.9 % IV SOLN
INTRAVENOUS | Status: AC
  Administered 2018-12-16: 15:00:00 via INTRAVENOUS

## 2018-12-16 MED ORDER — VERAPAMIL HCL 2.5 MG/ML IV SOLN
INTRA_ARTERIAL | Status: DC | PRN
  Administered 2018-12-16: 5 mL via INTRA_ARTERIAL

## 2018-12-16 MED ORDER — ONDANSETRON HCL 4 MG/2ML IJ SOLN: 4.0000 mg | Freq: Four times a day (QID) | INTRAMUSCULAR | Status: DC | PRN

## 2018-12-16 MED ORDER — LIDOCAINE HCL (PF) 1 % IJ SOLN
INTRAMUSCULAR | Status: AC
  Filled 2018-12-16: qty 30

## 2018-12-16 MED ORDER — MAGNESIUM SULFATE 4 GM/100ML IV SOLN
4.0000 g | Freq: Once | INTRAVENOUS | Status: AC
  Administered 2018-12-16: 4 g via INTRAVENOUS
  Filled 2018-12-16: qty 100

## 2018-12-16 MED ORDER — SODIUM CHLORIDE 0.9% FLUSH: 3.0000 mL | INTRAVENOUS | Status: DC | PRN

## 2018-12-16 MED ORDER — MIDAZOLAM HCL 2 MG/2ML IJ SOLN
INTRAMUSCULAR | Status: AC
  Filled 2018-12-16: qty 2

## 2018-12-16 MED ORDER — FENTANYL CITRATE (PF) 100 MCG/2ML IJ SOLN
INTRAMUSCULAR | Status: AC
  Filled 2018-12-16: qty 2

## 2018-12-16 MED ORDER — HEPARIN SODIUM (PORCINE) 1000 UNIT/ML IJ SOLN
INTRAMUSCULAR | Status: AC
  Filled 2018-12-16: qty 1

## 2018-12-16 MED ORDER — SODIUM CHLORIDE 0.9 % IV SOLN: 250.0000 mL | INTRAVENOUS | Status: DC | PRN

## 2018-12-16 MED ORDER — HEPARIN (PORCINE) IN NACL 1000-0.9 UT/500ML-% IV SOLN
INTRAVENOUS | Status: DC | PRN
  Administered 2018-12-16 (×3): 500 mL

## 2018-12-16 SURGICAL SUPPLY — 15 items
CATH BALLN WEDGE 5F 110CM (CATHETERS) ×2 IMPLANT
CATH OPTITORQUE TIG 4.0 5F (CATHETERS) ×2 IMPLANT
COVER DOME SNAP 22 D (MISCELLANEOUS) ×4 IMPLANT
DEVICE RAD COMP TR BAND LRG (VASCULAR PRODUCTS) ×2 IMPLANT
GLIDESHEATH SLEND A-KIT 6F 22G (SHEATH) ×2 IMPLANT
GLIDESHEATH SLEND SS 6F .021 (SHEATH) IMPLANT
GUIDEWIRE .025 260CM (WIRE) ×2 IMPLANT
GUIDEWIRE INQWIRE 1.5J.035X260 (WIRE) ×1 IMPLANT
INQWIRE 1.5J .035X260CM (WIRE) ×2
KIT HEART LEFT (KITS) ×2 IMPLANT
PACK CARDIAC CATHETERIZATION (CUSTOM PROCEDURE TRAY) ×2 IMPLANT
SHEATH GLIDE SLENDER 4/5FR (SHEATH) ×2 IMPLANT
TRANSDUCER W/STOPCOCK (MISCELLANEOUS) ×2 IMPLANT
TUBING CIL FLEX 10 FLL-RA (TUBING) ×2 IMPLANT
WIRE HI TORQ VERSACORE-J 145CM (WIRE) ×2 IMPLANT

## 2018-12-16 NOTE — H&P (View-Only) (Signed)
Progress Note  Patient Name: Kevin Hampton Date of Encounter: 12/16/2018  Primary Cardiologist: Dr. Quay Burow  Subjective   Feels much better this morning after diuresing 2 L  Inpatient Medications    Scheduled Meds: . atorvastatin  80 mg Oral q1800  . carvedilol  3.125 mg Oral BID WC  . glipiZIDE  10 mg Oral BID AC  . heparin  5,000 Units Subcutaneous Q8H  . insulin aspart  0-15 Units Subcutaneous TID WC  . insulin aspart  0-5 Units Subcutaneous QHS  . sacubitril-valsartan  1 tablet Oral BID  . sodium chloride flush  3 mL Intravenous Q12H  . sodium chloride flush  3 mL Intravenous Q12H  . spironolactone  12.5 mg Oral Daily   Continuous Infusions: . sodium chloride    . sodium chloride    . sodium chloride 10 mL/hr at 12/16/18 0528  . magnesium sulfate bolus IVPB    . nitroGLYCERIN Stopped (12/15/18 1152)   PRN Meds: sodium chloride, sodium chloride, acetaminophen, ondansetron (ZOFRAN) IV, sodium chloride flush, sodium chloride flush   Vital Signs    Vitals:   12/16/18 0400 12/16/18 0500 12/16/18 0600 12/16/18 0700  BP: 115/79 131/75 (!) 148/90   Pulse: 81 82 88   Resp: 20 19 (!) 24   Temp: 98.4 F (36.9 C)   98.6 F (37 C)  TempSrc: Oral   Oral  SpO2: 93% 96% 95%   Weight:  99.4 kg    Height:        Intake/Output Summary (Last 24 hours) at 12/16/2018 0804 Last data filed at 12/15/2018 2100 Gross per 24 hour  Intake 114.73 ml  Output 1601 ml  Net -1486.27 ml   Last 3 Weights 12/16/2018 12/15/2018 12/15/2018  Weight (lbs) 219 lb 2.2 oz 234 lb 2.1 oz 222 lb  Weight (kg) 99.4 kg 106.2 kg 100.699 kg      Telemetry    Sinus rhythm with frequent PVCs- Personally Reviewed  ECG    Not performed today- Personally Reviewed  Physical Exam   GEN: No acute distress.   Neck: No JVD Cardiac: RRR, no murmurs, rubs, or gallops.  Respiratory: Clear to auscultation bilaterally. GI: Soft, nontender, non-distended  MS: No edema; No deformity. Neuro:  Nonfocal   Psych: Normal affect   Labs    High Sensitivity Troponin:   Recent Labs  Lab 12/15/18 0342 12/15/18 0804  TROPONINIHS 55* 102*      Cardiac EnzymesNo results for input(s): TROPONINI in the last 168 hours. No results for input(s): TROPIPOC in the last 168 hours.   Chemistry Recent Labs  Lab 12/15/18 0342 12/16/18 0246  NA 136 138  K 4.5 3.8  CL 99 97*  CO2 24 28  GLUCOSE 411* 311*  BUN 9 14  CREATININE 1.07 1.08  CALCIUM 9.3 9.2  PROT 6.8  --   ALBUMIN 3.7  --   AST 22  --   ALT 22  --   ALKPHOS 98  --   BILITOT 0.8  --   GFRNONAA >60 >60  GFRAA >60 >60  ANIONGAP 13 13     Hematology Recent Labs  Lab 12/15/18 0342  WBC 11.4*  RBC 5.14  HGB 15.6  HCT 47.1  MCV 91.6  MCH 30.4  MCHC 33.1  RDW 12.7  PLT 203    BNP Recent Labs  Lab 12/15/18 0342  BNP 236.0*     DDimer No results for input(s): DDIMER in the last 168 hours.  Radiology    Dg Chest Port 1 View  Result Date: 12/15/2018 CLINICAL DATA:  53 y/o M; shortness of breath for a few weeks, worse in the past couple of days. EXAM: PORTABLE CHEST 1 VIEW COMPARISON:  12/15/2018 chest radiograph FINDINGS: Stable cardiac silhouette given projection and technique. Aortic atherosclerosis. Increase interstitial opacities again noted. Right hilar opacity appears less masslike on the current study, likely due to differences in patient positioning. No pleural effusion or pneumothorax. No acute osseous abnormality is evident. IMPRESSION: Persistent interstitial opacities may represent interstitial edema or atypical pneumonia. Right hilar opacity appears less masslike on the current study, likely due to differences in patient positioning. Electronically Signed   By: Kristine Garbe M.D.   On: 12/15/2018 04:24    Cardiac Studies   2D echo (12/15/2018)  IMPRESSIONS    1. The left ventricle has severely reduced systolic function, with an ejection fraction of 25-30%. The cavity size was moderately  dilated. There is mildly increased left ventricular wall thickness. Left ventricular diastolic Doppler parameters are  consistent with impaired relaxation.  2. The right ventricle has normal systolic function. The cavity was normal.  3. Left atrial size was mildly dilated.  4. The mitral valve is grossly normal. There is mild mitral annular calcification present.  5. The tricuspid valve is grossly normal.  6. The aortic valve was not well visualized. Aortic valve regurgitation is trivial by color flow Doppler.  7. Severe global reduction in LV systolic function; moderate LVE; mild LVH; mild diastolic dysfunction; trace AI; mild LAE.  FINDINGS  Left Ventricle: The left ventricle has severely reduced systolic function, with an ejection fraction of 25-30%. The cavity size was moderately dilated. There is mildly increased left ventricular wall thickness. Left ventricular diastolic Doppler  parameters are consistent with impaired relaxation.   Patient Profile     53 y.o. 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  He has a history of nonsupine diabetes.  His enzymes were mildly elevated.  His chest x-ray showed pulmonary edema.  He is diuresed 2 L.  His echo shows an EF of 20 to 25% with a dilated left ventricle and multiple wall motion abnormalities.  Assessment & Plan    1: Hypertensive urgency- blood pressure better controlled today after weaning off of IV nitroglycerin on Entresto and spironolactone.  2: Left ventricular dysfunction- EF 20 to 25% by 2D echo with a dilated left ventricle and multiple wall motion normalities.  We will start low-dose current carvedilol.  The etiology of his LV dysfunction is still unclear.  We will schedule for right left heart cath today.  3: Non-insulin-requiring diabetes- on sliding scale insulin and glipizide.  For  questions or updates, please contact [place] Please consult [email or web address] for contact info under        Signed, Quay Burow, MD  12/16/2018, 8:04 AM

## 2018-12-16 NOTE — Progress Notes (Signed)
Results for Kevin Hampton, Kevin Hampton (MRN [id number]) as of 12/16/2018 07:40  Ref. Range 12/15/2018 04:54  Hemoglobin A1C Latest Ref Range: 4.8 - 5.6 % 12.7 (H)  (317 mg/dl)    Home DM Meds: Glipizide 10 mg BID                             Metformin 1000 mg BID  Current Orders: Novolog Moderate Correction Scale/ SSI (0-15 units) TID AC + HS                           Glipizide 10 mg BID      MD- Please consider the following in-hospital insulin adjustments:  Start basal insulin for this patient--CBG elevated to 321 mg/dl this AM.  Recommend Lantus 20 units Daily (can start after procedure today) (0.2 units/kg dosing based on weight of 99.4kg)     Met w/ pt today to discuss elevated A1c.  Pt told me he has not been following a diabetes diet and eats whatever he wants.  Drinks regular sodas 2-3 times per week (cans or bottles) and stated to me he eats "badly" 60% of the time and eats "good" 40% of the time.  Stated to me he lost 80 pounds several years back and managed his diabetes well with diet an exercise.  Had to add Glipizide and Metformin several years back.  Pt stated to me that this hospitalization has been a "wake up call" for him and that he plans to make major changes when he goes home.  Does NOT want to add another medication to his Glipizide and Metformin and told me he does NOT want to go home on insulin.  Stated to me he is actively looking for a PCP and wants to try to make lifestyle major lifestyle changes at home before his meds are adjusted.  Seemed very motivated and sincere.  Currently has [employer] and feels confident he can find a PCP for regular medical care on his own.  I asked patient if he would like for the RD to meet with him to review DM diet info but pt politely declined and stated he know what and how much to eat, he just needs to do it and make an effort.     --Will follow patient during hospitalization--  Wyn Quaker  RN, MSN, CDE Diabetes Coordinator Inpatient Glycemic Control Team Team Pager: [phone] (8a-5p)

## 2018-12-16 NOTE — Plan of Care (Signed)

## 2018-12-16 NOTE — TOC Initial Note (Signed)
Transition of Care [facility]) - Initial/Assessment Note    Patient Details  Name: Kevin Hampton MRN: [id number] Date of Birth: Dec 20, 1965  Transition of Care [facility]) CM/SW Contact:    Midge Minium RN, BSN, NCM-BC, ACM-RN [phone] Phone Number: 12/16/2018, 4:16 PM  Clinical Narrative:                 CM consult acknowledged for PCP/medication assistance. Patient is listed as uninsured with no PCP. CM attempted to contact the patient to discuss the POC, with no answer. CM team will continue to follow.   Expected Discharge Plan: Home/Self Care Barriers to Discharge: Continued Medical Work up   Expected Discharge Plan and Services Expected Discharge Plan: Home/Self Care   Discharge Planning Services: CM Consult, [facility], Follow-up appt scheduled, Medication Assistance    Activities of Daily Living Home Assistive Devices/Equipment: None ADL Screening (condition at time of admission) Patient's cognitive ability adequate to safely complete daily activities?: Yes Is the patient deaf or have difficulty hearing?: No Does the patient have difficulty seeing, even when wearing glasses/contacts?: No Does the patient have difficulty concentrating, remembering, or making decisions?: No Patient able to express need for assistance with ADLs?: No Does the patient have difficulty dressing or bathing?: No Independently performs ADLs?: Yes (appropriate for developmental age) Does the patient have difficulty walking or climbing stairs?: No Weakness of Legs: None Weakness of Arms/Hands: None   Admission diagnosis:  STEMI (ST elevation myocardial infarction) [facility]) [I21.3] Patient Active Problem List   Diagnosis Date Noted  . Acute pulmonary edema ([place]) 12/15/2018  . Acute CHF (congestive heart failure) ([place]) 12/15/2018  . Hypertensive emergency 12/15/2018   PCP:  No primary care provider on file. Pharmacy:   [facility] [address], [place] [address] Phone: [phone] Fax: [place], [place] - [address] [place] [address] Phone: [phone] Fax: [phone]     Social Determinants of Health (SDOH) Interventions    Readmission Risk Interventions No flowsheet data found.

## 2018-12-16 NOTE — Progress Notes (Signed)
Inpatient Diabetes Program Recommendations  AACE/ADA: New Consensus Statement on Inpatient Glycemic Control (2015)  Target Ranges:  Prepandial:   less than 140 mg/dL      Peak postprandial:   less than 180 mg/dL (1-2 hours)      Critically ill patients:  140 - 180 mg/dL   Results for Kevin Hampton, Kevin Hampton (MRN [id number]) as of 12/16/2018 07:40  Ref. Range 12/15/2018 08:09 12/15/2018 11:17 12/15/2018 15:41 12/15/2018 21:27  Glucose-Capillary Latest Ref Range: 70 - 99 mg/dL 386 (H) 312 (H)  11 units NOVOLOG  350 (H)  11 units NOVOLOG  292 (H)  3 units NOVOLOG    Results for Kevin Hampton, Kevin Hampton (MRN [id number]) as of 12/16/2018 07:40  Ref. Range 12/16/2018 06:57  Glucose-Capillary Latest Ref Range: 70 - 99 mg/dL 321 (H)   Results for Kevin Hampton, Kevin Hampton (MRN [id number]) as of 12/16/2018 07:40  Ref. Range 12/15/2018 04:54  Hemoglobin A1C Latest Ref Range: 4.8 - 5.6 % 12.7 (H)  (317 mg/dl)     Admit with: Hypertensive emergency, acute pulmonary edema and CHF  History: DM  Home DM Meds: Glipizide 10 mg BID       Metformin 1000 mg BID  Current Orders: Novolog Moderate Correction Scale/ SSI (0-15 units) TID AC + HS     Glipizide 10 mg BID    NO Insurance noted  NO PCP noted either      MD- Please consider the following in-hospital insulin adjustments:  Start basal insulin for this patient--CBG elevated to 321 mg/dl this AM.  Recommend Lantus 20 units Daily (can start after procedure today) (0.2 units/kg dosing based on weight of 99.4kg)  Likely needs insulin at home given his Current A1c is 12.7%--Plan to speak with pt about his A1c and home DM care plan     --Will follow patient during hospitalization--  Wyn Quaker RN, MSN, CDE Diabetes Coordinator Inpatient Glycemic Control Team Team Pager: [phone] (8a-5p)

## 2018-12-16 NOTE — Interval H&P Note (Signed)
Cath Lab Visit (complete for each Cath Lab visit)  Clinical Evaluation Leading to the Procedure:   ACS: Yes.    Non-ACS:    Anginal Classification: CCS II  Anti-ischemic medical therapy: No Therapy  Non-Invasive Test Results: No non-invasive testing performed  Prior CABG: No previous CABG      History and Physical Interval Note:  12/16/2018 12:11 PM  Kevin Hampton  has presented today for surgery, with the diagnosis of chest pain.  The various methods of treatment have been discussed with the patient and family. After consideration of risks, benefits and other options for treatment, the patient has consented to  Procedure(s): RIGHT/LEFT HEART CATH AND CORONARY ANGIOGRAPHY (N/A) as a surgical intervention.  The patient's history has been reviewed, patient examined, no change in status, stable for surgery.  I have reviewed the patient's chart and labs.  Questions were answered to the patient's satisfaction.     Quay Burow

## 2018-12-16 NOTE — Progress Notes (Signed)
Progress Note  Patient Name: Kevin Hampton Date of Encounter: 12/16/2018  Primary Cardiologist: Dr. Quay Burow  Subjective   Feels much better this morning after diuresing 2 L  Inpatient Medications    Scheduled Meds: . atorvastatin  80 mg Oral q1800  . carvedilol  3.125 mg Oral BID WC  . glipiZIDE  10 mg Oral BID AC  . heparin  5,000 Units Subcutaneous Q8H  . insulin aspart  0-15 Units Subcutaneous TID WC  . insulin aspart  0-5 Units Subcutaneous QHS  . sacubitril-valsartan  1 tablet Oral BID  . sodium chloride flush  3 mL Intravenous Q12H  . sodium chloride flush  3 mL Intravenous Q12H  . spironolactone  12.5 mg Oral Daily   Continuous Infusions: . sodium chloride    . sodium chloride    . sodium chloride 10 mL/hr at 12/16/18 0528  . magnesium sulfate bolus IVPB    . nitroGLYCERIN Stopped (12/15/18 1152)   PRN Meds: sodium chloride, sodium chloride, acetaminophen, ondansetron (ZOFRAN) IV, sodium chloride flush, sodium chloride flush   Vital Signs    Vitals:   12/16/18 0400 12/16/18 0500 12/16/18 0600 12/16/18 0700  BP: 115/79 131/75 (!) 148/90   Pulse: 81 82 88   Resp: 20 19 (!) 24   Temp: 98.4 F (36.9 C)   98.6 F (37 C)  TempSrc: Oral   Oral  SpO2: 93% 96% 95%   Weight:  99.4 kg    Height:        Intake/Output Summary (Last 24 hours) at 12/16/2018 0804 Last data filed at 12/15/2018 2100 Gross per 24 hour  Intake 114.73 ml  Output 1601 ml  Net -1486.27 ml   Last 3 Weights 12/16/2018 12/15/2018 12/15/2018  Weight (lbs) 219 lb 2.2 oz 234 lb 2.1 oz 222 lb  Weight (kg) 99.4 kg 106.2 kg 100.699 kg      Telemetry    Sinus rhythm with frequent PVCs- Personally Reviewed  ECG    Not performed today- Personally Reviewed  Physical Exam   GEN: No acute distress.   Neck: No JVD Cardiac: RRR, no murmurs, rubs, or gallops.  Respiratory: Clear to auscultation bilaterally. GI: Soft, nontender, non-distended  MS: No edema; No deformity. Neuro:  Nonfocal   Psych: Normal affect   Labs    High Sensitivity Troponin:   Recent Labs  Lab 12/15/18 0342 12/15/18 0804  TROPONINIHS 55* 102*      Cardiac EnzymesNo results for input(s): TROPONINI in the last 168 hours. No results for input(s): TROPIPOC in the last 168 hours.   Chemistry Recent Labs  Lab 12/15/18 0342 12/16/18 0246  NA 136 138  K 4.5 3.8  CL 99 97*  CO2 24 28  GLUCOSE 411* 311*  BUN 9 14  CREATININE 1.07 1.08  CALCIUM 9.3 9.2  PROT 6.8  --   ALBUMIN 3.7  --   AST 22  --   ALT 22  --   ALKPHOS 98  --   BILITOT 0.8  --   GFRNONAA >60 >60  GFRAA >60 >60  ANIONGAP 13 13     Hematology Recent Labs  Lab 12/15/18 0342  WBC 11.4*  RBC 5.14  HGB 15.6  HCT 47.1  MCV 91.6  MCH 30.4  MCHC 33.1  RDW 12.7  PLT 203    BNP Recent Labs  Lab 12/15/18 0342  BNP 236.0*     DDimer No results for input(s): DDIMER in the last 168 hours.  Radiology    Dg Chest Port 1 View  Result Date: 12/15/2018 CLINICAL DATA:  53 y/o M; shortness of breath for a few weeks, worse in the past couple of days. EXAM: PORTABLE CHEST 1 VIEW COMPARISON:  12/15/2018 chest radiograph FINDINGS: Stable cardiac silhouette given projection and technique. Aortic atherosclerosis. Increase interstitial opacities again noted. Right hilar opacity appears less masslike on the current study, likely due to differences in patient positioning. No pleural effusion or pneumothorax. No acute osseous abnormality is evident. IMPRESSION: Persistent interstitial opacities may represent interstitial edema or atypical pneumonia. Right hilar opacity appears less masslike on the current study, likely due to differences in patient positioning. Electronically Signed   By: Kristine Garbe M.D.   On: 12/15/2018 04:24    Cardiac Studies   2D echo (12/15/2018)  IMPRESSIONS    1. The left ventricle has severely reduced systolic function, with an ejection fraction of 25-30%. The cavity size was moderately  dilated. There is mildly increased left ventricular wall thickness. Left ventricular diastolic Doppler parameters are  consistent with impaired relaxation.  2. The right ventricle has normal systolic function. The cavity was normal.  3. Left atrial size was mildly dilated.  4. The mitral valve is grossly normal. There is mild mitral annular calcification present.  5. The tricuspid valve is grossly normal.  6. The aortic valve was not well visualized. Aortic valve regurgitation is trivial by color flow Doppler.  7. Severe global reduction in LV systolic function; moderate LVE; mild LVH; mild diastolic dysfunction; trace AI; mild LAE.  FINDINGS  Left Ventricle: The left ventricle has severely reduced systolic function, with an ejection fraction of 25-30%. The cavity size was moderately dilated. There is mildly increased left ventricular wall thickness. Left ventricular diastolic Doppler  parameters are consistent with impaired relaxation.   Patient Profile     53 y.o. 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  He has a history of nonsupine diabetes.  His enzymes were mildly elevated.  His chest x-ray showed pulmonary edema.  He is diuresed 2 L.  His echo shows an EF of 20 to 25% with a dilated left ventricle and multiple wall motion abnormalities.  Assessment & Plan    1: Hypertensive urgency- blood pressure better controlled today after weaning off of IV nitroglycerin on Entresto and spironolactone.  2: Left ventricular dysfunction- EF 20 to 25% by 2D echo with a dilated left ventricle and multiple wall motion normalities.  We will start low-dose current carvedilol.  The etiology of his LV dysfunction is still unclear.  We will schedule for right left heart cath today.  3: Non-insulin-requiring diabetes- on sliding scale insulin and glipizide.  For  questions or updates, please contact [place] Please consult [email or web address] for contact info under        Signed, Quay Burow, MD  12/16/2018, 8:04 AM

## 2018-12-17 DIAGNOSIS — I161 Hypertensive emergency: Principal | ICD-10-CM

## 2018-12-17 DIAGNOSIS — I428 Other cardiomyopathies: Secondary | ICD-10-CM

## 2018-12-17 LAB — BASIC METABOLIC PANEL
Anion gap: 14 (ref 5–15)
BUN: 18 mg/dL (ref 6–20)
CO2: 20 mmol/L — ABNORMAL LOW (ref 22–32)
Calcium: 9 mg/dL (ref 8.9–10.3)
Chloride: 102 mmol/L (ref 98–111)
Creatinine, Ser: 0.87 mg/dL (ref 0.61–1.24)
GFR calc Af Amer: >60 mL/min (ref >60)
GFR calc non Af Amer: >60 mL/min (ref >60)
Glucose, Bld: 245 mg/dL — ABNORMAL HIGH (ref 70–99)
Potassium: 3.8 mmol/L (ref 3.5–5.1)
Sodium: 136 mmol/L (ref 135–145)

## 2018-12-17 LAB — GLUCOSE, CAPILLARY
Glucose-Capillary: 258 mg/dL — ABNORMAL HIGH (ref 70–99)
Glucose-Capillary: 283 mg/dL — ABNORMAL HIGH (ref 70–99)

## 2018-12-17 MED ORDER — CARVEDILOL 6.25 MG PO TABS: 6.2500 mg | ORAL_TABLET | Freq: Two times a day (BID) | ORAL | 6 refills | Status: DC

## 2018-12-17 MED ORDER — MAGNESIUM OXIDE 400 (241.3 MG) MG PO TABS
400.0000 mg | ORAL_TABLET | Freq: Every day | ORAL | Status: DC
  Administered 2018-12-17: 11:00:00 400 mg via ORAL
  Filled 2018-12-17: qty 1

## 2018-12-17 MED ORDER — SPIRONOLACTONE 25 MG PO TABS: 12.5000 mg | ORAL_TABLET | Freq: Every day | ORAL | 6 refills | Status: DC

## 2018-12-17 MED ORDER — SACUBITRIL-VALSARTAN 49-51 MG PO TABS: 1.0000 | ORAL_TABLET | Freq: Two times a day (BID) | ORAL | 6 refills | Status: DC

## 2018-12-17 MED ORDER — GLIPIZIDE 10 MG PO TABS: 10.0000 mg | ORAL_TABLET | Freq: Two times a day (BID) | ORAL | 1 refills | Status: DC

## 2018-12-17 MED ORDER — CHLORHEXIDINE GLUCONATE CLOTH 2 % EX PADS: 6.0000 | MEDICATED_PAD | Freq: Every day | CUTANEOUS | Status: DC

## 2018-12-17 MED ORDER — METFORMIN HCL 500 MG PO TABS: 1000.0000 mg | ORAL_TABLET | Freq: Two times a day (BID) | ORAL | 1 refills | Status: DC

## 2018-12-17 MED ORDER — CARVEDILOL 6.25 MG PO TABS: 6.2500 mg | ORAL_TABLET | Freq: Two times a day (BID) | ORAL | Status: DC

## 2018-12-17 MED FILL — SPIRONOLACTONE 25 MG TABLET: 25 | 60 days supply | Qty: 30 | Fill #0

## 2018-12-17 MED FILL — glipiZIDE 10 MG TABS: 10 | 30 days supply | Qty: 60 | Fill #0

## 2018-12-17 MED FILL — ENTRESTO 49 MG-51 MG TABLET: 49-51 | 30 days supply | Qty: 60 | Fill #0

## 2018-12-17 MED FILL — metFORMIN HCL 1000 MG TABS: 1000 | 30 days supply | Qty: 60 | Fill #0

## 2018-12-17 MED FILL — CARVEDILOL 6.25 MG TABLET: 6.25 | 30 days supply | Qty: 60 | Fill #0

## 2018-12-17 NOTE — Progress Notes (Signed)
Inpatient Diabetes Program Recommendations  AACE/ADA: New Consensus Statement on Inpatient Glycemic Control (2015)  Target Ranges:  Prepandial:   less than 140 mg/dL      Peak postprandial:   less than 180 mg/dL (1-2 hours)      Critically ill patients:  140 - 180 mg/dL   Lab Results  Component Value Date   GLUCAP 258 (H) 12/17/2018   HGBA1C 12.7 (H) 12/15/2018    Review of Glycemic Control Results for JUNE, Kevin Hampton (MRN [id number]) as of 12/17/2018 10:33  Ref. Range 12/16/2018 12:56 12/16/2018 15:57 12/16/2018 22:02 12/17/2018 06:43  Glucose-Capillary Latest Ref Range: 70 - 99 mg/dL 282 (H) 321 (H) 244 (H) 258 (H)    Home DM Meds:Glipizide 10 mg BID Metformin 1000 mg BID  Current Orders:Novolog Moderate Correction Scale/ SSI (0-15 units) TID AC + HS Glipizide 10 mg BID    MD-If to remain inpatient consider:  Start basal insulin for this patient--CBG elevated to 258 mg/dl this AM.  Recommend Lantus 20 units Daily (0.2 units/kg dosing based on weight of 99.4kg)   Thanks, Bronson Curb, MSN, RNC-OB Diabetes Coordinator [phone] (8a-5p)

## 2018-12-17 NOTE — Discharge Summary (Addendum)
Discharge Summary    Patient ID: Kevin Hampton MRN: [id number]; DOB: 1965/10/03  Admit date: 12/15/2018 Discharge date: 12/17/2018  Primary Care Provider: No primary care provider on file.  Primary Cardiologist: [name] (likely long term follow up in [facility])  Discharge Diagnoses    Active Problems:   Acute pulmonary edema ([place])   Acute CHF (congestive heart failure) ([place])   Hypertensive emergency   NICM   DM  Allergies No Known Allergies  Diagnostic Studies/Procedures    RIGHT/LEFT HEART CATH AND CORONARY ANGIOGRAPHY 12/16/2018 IMPRESSION: Kevin Hampton has clean coronary arteries with [place] slightly depressed cardiac output and fairly normal filling pressures probably as result of diuresis.  He will need treatment for nonischemic cardiomyopathy with Entresto, spironolactone, and carvedilol.  The sheaths were removed and a TR band was placed on the right wrist to achieve patent hemostasis.  The patient left lab in stable condition. Fick Cardiac Output 5.1 L/min  Fick Cardiac Output Index 2.24 (L/min)/BSA  RA A Wave 4 mmHg  RA V Wave 2 mmHg  RA Mean 1 mmHg  RV Systolic Pressure 34 mmHg  RV Diastolic Pressure 1 mmHg  RV EDP 4 mmHg  PA Systolic Pressure 31 mmHg  PA Diastolic Pressure 8 mmHg  PA Mean 19 mmHg  PW A Wave 14 mmHg  PW V Wave 12 mmHg  PW Mean 11 mmHg  LV Systolic Pressure 967 mmHg  LV Diastolic Pressure 13 mmHg  LV EDP 17 mmHg  AOp Systolic Pressure 893 mmHg  AOp Diastolic Pressure 82 mmHg  AOp Mean Pressure 810 mmHg  LVp Systolic Pressure 175 mmHg  LVp Diastolic Pressure 12 mmHg  LVp EDP Pressure 16 mmHg  QP/QS 1  TPVR Index 8.49 HRUI     Echocardiogram 12/15/2018 IMPRESSIONS  1. The left ventricle has severely reduced systolic function, with an ejection fraction of 25-30%. The cavity size was moderately dilated. There is mildly increased left ventricular wall thickness. Left ventricular diastolic Doppler parameters are  consistent with impaired  relaxation.  2. The right ventricle has normal systolic function. The cavity was normal.  3. Left atrial size was mildly dilated.  4. The mitral valve is grossly normal. There is mild mitral annular calcification present.  5. The tricuspid valve is grossly normal.  6. The aortic valve was not well visualized. Aortic valve regurgitation is trivial by color flow Doppler.  7. Severe global reduction in LV systolic function; moderate LVE; mild LVH; mild diastolic dysfunction; trace AI; mild LAE   History of Present Illness     Kevin Hampton is a 53 year old male with a history of diabetes not requiring insulin, hypertension who [facility] for evaluation of hypertensive emergency acute pulmonary edema with heart failure and possible STEMI.  However EKG shows left bundle branch block and not consistent with STEMI.  STEMI activation was canceled. He was given IV Lasix and started on nitroglycerin drip in the ER with improvement in dyspnea.    No prior cardiac history.  About 1 month ago, he started to notice some mild shortness of breath at home which seem to be worse with exertion but overall did not bother him too much.  However about 1 week ago the shortness of breath increased in severity and he has had difficulty breathing with almost any activity and difficulty lying flat.  He denied significant leg swelling or chest pain. He has a history of hypertension that was treated with lisinopril a few years ago however he said  that he was able to stop the lisinopril because the blood pressure got too low and he did not need the medicine.  He has never required insulin for his diabetes.  He takes metformin and glipizide.  Hiis mother had a heart attack in the past but otherwise denies any significant cardiac family history.  He smoked rarely as a teenager but is otherwise a non-smoker. He notes significant weight loss of about 80 pounds over the past 1 to 2 years associated with  developing diabetes.  Hospital Course     Consultants: None  1: Hypertensive urgency- blood pressure improved on IV nitroglycerin, wean off. Started on Entresto and spironolactone.  2: NICM/Acute systolic CHF- His chest x-ray showed pulmonary edema. LVEF EF 20 to 25% by 2D echo with a dilated left ventricle and multiple wall motion normalities. He had right and left heart cath performed by Dr. Gwenlyn Found revealed clean coronary arteries with fairly normal filling pressures as a result of diuresis. Diuresed -1.2L. weight stable at 222-223lb. He denied recurrent shortness of breath and felt clinically improved and back to baseline. Ambulated well without any issue. LDL 81. Tolerating Coreg, Entresto and spironolactone.   3: Uncontrolled diabetes- treated with sliding scale insulin and glipizide while in hospital. HgbA1c 12.7. He has not been following a diabetes diet, eats whatever he wants and drinks regular sodas 2-3 times/week. The patient has been followed by Diabetic coordinator.  Declined insuline at discharge. He will continue Metformin and Glipizide. He will establish care with PCP. Arranged by Case Manager.   The patient been seen by Dr. Gwenlyn Found today and deemed ready for discharge home. All follow-up appointments have been scheduled. Discharge medications are listed below.   Discharge Vitals Blood pressure 118/82, pulse 79, temperature 98.9 F (37.2 C), temperature source Oral, resp. rate 20, height 6\' 3"  (1.905 m), weight 101.6 kg, SpO2 97 %.  Filed Weights   12/15/18 0622 12/16/18 0500 12/17/18 0500  Weight: 106.2 kg 99.4 kg 101.6 kg    Labs & Radiologic Studies    CBC Recent Labs    12/15/18 0342 12/16/18 1252 12/16/18 1257  WBC 11.4*  --   --   NEUTROABS 9.2*  --   --   HGB 15.6 17.0  16.3 16.7  HCT 47.1 50.0  48.0 49.0  MCV 91.6  --   --   PLT 203  --   --    Basic Metabolic Panel Recent Labs    12/15/18 0454 12/16/18 0246  12/16/18 1257 12/17/18 0256  NA  --   138   < > 138 136  K  --  3.8   < > 3.8 3.8  CL  --  97*  --   --  102  CO2  --  28  --   --  20*  GLUCOSE  --  311*  --   --  245*  BUN  --  14  --   --  18  CREATININE  --  1.08  --   --  0.87  CALCIUM  --  9.2  --   --  9.0  MG 1.4* 1.7  --   --   --    < > = values in this interval not displayed.   Liver Function Tests Recent Labs    12/15/18 0342  AST 22  ALT 22  ALKPHOS 98  BILITOT 0.8  PROT 6.8  ALBUMIN 3.7   Hemoglobin A1C Recent Labs    12/15/18 0454  HGBA1C 12.7*   Fasting Lipid Panel Recent Labs    12/15/18 0454  CHOL 192  HDL 52  LDLCALC 81  TRIG 293*  CHOLHDL 3.7   Thyroid Function Tests Recent Labs    12/15/18 0454  TSH 0.789   _____________  Dg Chest Port 1 View  Result Date: 12/15/2018 CLINICAL DATA:  53 y/o M; shortness of breath for a few weeks, worse in the past couple of days. EXAM: PORTABLE CHEST 1 VIEW COMPARISON:  12/15/2018 chest radiograph FINDINGS: Stable cardiac silhouette given projection and technique. Aortic atherosclerosis. Increase interstitial opacities again noted. Right hilar opacity appears less masslike on the current study, likely due to differences in patient positioning. No pleural effusion or pneumothorax. No acute osseous abnormality is evident. IMPRESSION: Persistent interstitial opacities may represent interstitial edema or atypical pneumonia. Right hilar opacity appears less masslike on the current study, likely due to differences in patient positioning. Electronically Signed   By: Kristine Garbe M.D.   On: 12/15/2018 04:24   Disposition   Pt is being discharged home today in good condition.  Follow-up Plans & Appointments    Follow-up Information    Primary Care at [facility]. Go on 12/31/2018.   Specialty: Family Medicine Why: at 9:30am for your hospital follow-up appointment Contact information: [address] [place]. Go to.   Why: to utilize the [place] for discounted prescriptions for $4-$10; discuss applying for the "[employer]" for discounted co-pays and prescriptions Contact information: [place] [address] [phone]       Clegg, Amy D, NP Follow up on 01/08/2019.   Specialty: Cardiology Why: @10am  for hosptial follow  up with [name] team  Please call for direction and parking code Contact information: [address] [place] [address] [phone]          Discharge Instructions    Diet - low sodium heart healthy   Complete by: As directed    Discharge instructions   Complete by: As directed    No driving for 48. No lifting over 5 lbs for 1 week. No sexual activity for 1 week. You may return to work on 12/23/2018. Keep procedure site clean & dry. If you notice increased pain, swelling, bleeding or pus, call/return!  You may shower, but no soaking baths/hot tubs/pools for 1 week.   Restart Metformin 12/18/2018   Increase activity slowly   Complete by: As directed       Discharge Medications   Allergies as of 12/17/2018   No Known Allergies     Medication List    TAKE these medications   carvedilol 6.25 MG tablet Commonly known as: COREG Take 1 tablet (6.25 mg total) by mouth 2 (two) times daily with a meal.   glipiZIDE 10 MG tablet Commonly known as: GLUCOTROL Take 1 tablet (10 mg total) by mouth 2 (two) times a day. Further refills by PCP What changed: additional instructions   metFORMIN 500 MG tablet Commonly known as: GLUCOPHAGE Take 2 tablets (1,000 mg total) by mouth 2 (two) times daily with a meal. Start taking on: December 18, 2018   OVER THE COUNTER MEDICATION Take 1 tablet by mouth 2 (two) times a day. Vitamin for diabatic   sacubitril-valsartan 49-51 MG Commonly known as: ENTRESTO Take 1 tablet by mouth 2 (two) times daily.   spironolactone 25  MG  tablet Commonly known as: ALDACTONE Take 0.5 tablets (12.5 mg total) by mouth daily. Start taking on: December 18, 2018        Acute coronary syndrome (MI, NSTEMI, STEMI, etc) this admission?: No.    Outstanding Labs/Studies   None  Duration of Discharge Encounter   Greater than 30 minutes including physician time.  Signed, Leanor Kail, PA 12/17/2018, 11:23 AM   Agree with note by Robbie Lis PA-C  Patient with nonischemic cardiomyopathy, EF of 25 to 30% with a dilated LV and normal coronary arteries by cath yesterday.  He is on appropriate medications including Entresto, carvedilol and spironolactone and stable for discharge home today.  His lungs are clear.  He has no peripheral edema or JVD.  He feels clinically improved after diuresis.  We will arrange for him to see an APP back in 7 days and [name] in the heart failure clinic in 3 to 4 weeks.  Lorretta Harp, M.D., [place], [facility], [place] [address] [place], [place]  [address]  [phone] 12/17/2018 2:21 PM

## 2018-12-17 NOTE — Discharge Instructions (Signed)

## 2018-12-17 NOTE — TOC Transition Note (Signed)
Transition of Care [facility]) - CM/SW Discharge Note   Patient Details  Name: Damarian Priola MRN: [id number] Date of Birth: Mar 29, 1966  Transition of Care [facility]) CM/SW Contact:  Midge Minium RN, BSN, NCM-BC, ACM-RN [phone] Phone Number: 12/17/2018, 9:29 AM   Clinical Narrative:    CM following for dispositional needs. CM spoke to the patient to discuss his DC needs. Patient lives at home with his spouse and was independent with his ADLs PTA. Patient confirmed not having active health insurance, nor an established PCP, but agreeable to CM assistance and to f/u in [place] for his outpatient needs. Patient has an [place] of 12.7% and will be new to [place]. CM arranged a hospital f/u appointment for establishing a new PCP at: [facility] on 12/31/18 @ 0930 and was informed he can f/u for his Rx needs at [facility] for discounted Rx for $4-$10 and to apply for the "[facility]", with the patient verbalizing understanding; AVS updated. TOC pharmacy can provide a 30-day free supply of Entresto and provide his Rx before discharge. No further needs from CM.    Final next level of care: Home/Self Care Barriers to Discharge: No Barriers Identified   Patient Goals and CMS Choice     Choice offered to / list presented to : NA   Discharge Plan and Services   Discharge Planning Services: CM Consult, [facility], Follow-up appt scheduled, Medication Assistance  Social Determinants of Health (SDOH) Interventions     Readmission Risk Interventions No flowsheet data found.

## 2018-12-17 NOTE — Progress Notes (Signed)
Discharge instructions reviewed with patient, patient's questions were answered and patient voiced understanding in his own words.  Patient's IVs removed.  All patient belongings returned to patient.

## 2018-12-17 NOTE — Progress Notes (Signed)
Progress Note  Patient Name: Kevin Hampton Date of Encounter: 12/17/2018  Primary Cardiologist: Dr. Quay Burow  Subjective   Feels much better this morning after diuresing 1.2 L.  Postop day #1 right and left heart cath performed radially and brachial he revealing nonischemic cardiomyopathy with adequate filling pressures  Inpatient Medications    Scheduled Meds: . atorvastatin  80 mg Oral q1800  . carvedilol  6.25 mg Oral BID WC  . Chlorhexidine Gluconate Cloth  6 each Topical Daily  . glipiZIDE  10 mg Oral BID AC  . heparin  5,000 Units Subcutaneous Q8H  . insulin aspart  0-15 Units Subcutaneous TID WC  . insulin aspart  0-5 Units Subcutaneous QHS  . magnesium oxide  400 mg Oral Daily  . sacubitril-valsartan  1 tablet Oral BID  . sodium chloride flush  3 mL Intravenous Q12H  . sodium chloride flush  3 mL Intravenous Q12H  . spironolactone  12.5 mg Oral Daily   Continuous Infusions: . sodium chloride    . sodium chloride     PRN Meds: sodium chloride, sodium chloride, acetaminophen, morphine injection, ondansetron (ZOFRAN) IV, sodium chloride flush, sodium chloride flush   Vital Signs    Vitals:   12/17/18 0400 12/17/18 0500 12/17/18 0700 12/17/18 0747  BP: 99/75 104/79 (!) 136/93   Pulse: 71 72 83   Resp: 17 14 (!) 21   Temp: 98.2 F (36.8 C)   98.9 F (37.2 C)  TempSrc: Oral   Oral  SpO2: 95% 98% 100%   Weight:  101.6 kg    Height:        Intake/Output Summary (Last 24 hours) at 12/17/2018 0826 Last data filed at 12/16/2018 2300 Gross per 24 hour  Intake 766.69 ml  Output 0 ml  Net 766.69 ml   Last 3 Weights 12/17/2018 12/16/2018 12/15/2018  Weight (lbs) 223 lb 15.8 oz 219 lb 2.2 oz 234 lb 2.1 oz  Weight (kg) 101.6 kg 99.4 kg 106.2 kg      Telemetry    Sinus rhythm with frequent PVCs- Personally Reviewed  ECG    Not performed today- Personally Reviewed  Physical Exam   GEN: No acute distress.   Neck: No JVD Cardiac: RRR, no murmurs, rubs, or  gallops.  Respiratory: Clear to auscultation bilaterally. GI: Soft, nontender, non-distended  MS: No edema; No deformity. Neuro:  Nonfocal  Psych: Normal affect   Labs    High Sensitivity Troponin:   Recent Labs  Lab 12/15/18 0342 12/15/18 0804  TROPONINIHS 55* 102*      Cardiac EnzymesNo results for input(s): TROPONINI in the last 168 hours. No results for input(s): TROPIPOC in the last 168 hours.   Chemistry Recent Labs  Lab 12/15/18 0342 12/16/18 0246 12/16/18 1252 12/16/18 1257 12/17/18 0256  NA 136 138 138  138 138 136  K 4.5 3.8 3.8  3.8 3.8 3.8  CL 99 97*  --   --  102  CO2 24 28  --   --  20*  GLUCOSE 411* 311*  --   --  245*  BUN 9 14  --   --  18  CREATININE 1.07 1.08  --   --  0.87  CALCIUM 9.3 9.2  --   --  9.0  PROT 6.8  --   --   --   --   ALBUMIN 3.7  --   --   --   --   AST 22  --   --   --   --  ALT 22  --   --   --   --   ALKPHOS 98  --   --   --   --   BILITOT 0.8  --   --   --   --   GFRNONAA >60 >60  --   --  >60  GFRAA >60 >60  --   --  >60  ANIONGAP 13 13  --   --  14     Hematology Recent Labs  Lab 12/15/18 0342 12/16/18 1252 12/16/18 1257  WBC 11.4*  --   --   RBC 5.14  --   --   HGB 15.6 17.0  16.3 16.7  HCT 47.1 50.0  48.0 49.0  MCV 91.6  --   --   MCH 30.4  --   --   MCHC 33.1  --   --   RDW 12.7  --   --   PLT 203  --   --     BNP Recent Labs  Lab 12/15/18 0342  BNP 236.0*     DDimer No results for input(s): DDIMER in the last 168 hours.   Radiology    No results found.  Cardiac Studies   2D echo (12/15/2018)  IMPRESSIONS    1. The left ventricle has severely reduced systolic function, with an ejection fraction of 25-30%. The cavity size was moderately dilated. There is mildly increased left ventricular wall thickness. Left ventricular diastolic Doppler parameters are  consistent with impaired relaxation.  2. The right ventricle has normal systolic function. The cavity was normal.  3. Left atrial  size was mildly dilated.  4. The mitral valve is grossly normal. There is mild mitral annular calcification present.  5. The tricuspid valve is grossly normal.  6. The aortic valve was not well visualized. Aortic valve regurgitation is trivial by color flow Doppler.  7. Severe global reduction in LV systolic function; moderate LVE; mild LVH; mild diastolic dysfunction; trace AI; mild LAE.  FINDINGS  Left Ventricle: The left ventricle has severely reduced systolic function, with an ejection fraction of 25-30%. The cavity size was moderately dilated. There is mildly increased left ventricular wall thickness. Left ventricular diastolic Doppler  parameters are consistent with impaired relaxation.   Patient Profile     53 y.o. 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  He has a history of nonsupine diabetes.  His enzymes were mildly elevated.  His chest x-ray showed pulmonary edema.  He is diuresed 1.2 L.  His echo shows an EF of 20 to 25% with a dilated left ventricle and multiple wall motion abnormalities.  He underwent right left heart cath revealing clean coronary arteries suggesting nonischemic cardiomyopathy.  Assessment & Plan    1: Hypertensive urgency- blood pressure better controlled today after weaning off of IV nitroglycerin on Entresto and spironolactone.  Blood pressure better controlled today.  He is on Entresto, carvedilol and spironolactone  2: Left ventricular dysfunction- EF 20 to 25% by 2D echo with a dilated left ventricle and multiple wall motion normalities.  He had right and left heart cath performed by myself yesterday radially/brachial he revealing clean coronary arteries with fairly normal filling pressures as a result of diuresis.  He is on Entresto 49/51, carvedilol which we will titrate and spironolactone.  He denies shortness of  breath and feels clinically improved and back to  baseline.  3: Non-insulin-requiring diabetes- on sliding scale insulin and glipizide.  Would benefit from being on an SGLT2 inhibitor.  We will arrange through the outpatient pharmacy.  Mr. Niess is stable for discharge today after ambulation.  We will arrange for him to follow-up with a midlevel provider in 7 days and [name] in 2 to 3 weeks.  He will need 2D echocardiography in 3 months to establish whether he has improvement LV function and to determine whether or not he requires ICD implantation for primary prevention.  For questions or updates, please contact [facility] Please consult [email or web address] for contact info under        Signed, Quay Burow, MD  12/17/2018, 8:26 AM

## 2018-12-17 NOTE — Progress Notes (Signed)
Patient ambulated 740 feet with no complaints.  Heart rate stable during ambulation and patient denied feeling any symptoms such as shortness of breath, dizziness or weakness.    RN and patient discussed new medications and patient advised to monitor vital signs such as blood pressure and heart rate at home and to report any symptoms to MD at follow up appointment and/or to call MD if patient experiencing symptoms such as low blood pressure/heart rate resulting in dizziness, syncope, and/or increased weakness.  RN and patient discussed radial artery site care and restrictions.  RN and patient discussed increasing activity slowly and discussing ongoing activity restrictions with MD at follow up appointment.  All patient's questions answered and patient voiced understanding in his own words.   Paged PA Bhagat and advised patient ambulated.  PA Bhagat advised will write discharge orders.

## 2018-12-26 ENCOUNTER — Telehealth: Payer: Self-pay | Admitting: Cardiology

## 2018-12-26 NOTE — Telephone Encounter (Signed)
Patient's wife called in requesting advice regarding patient's medication. Recent dx of NICM, placed on Entresto, coreg and spiro at discharge. Wife reports his blood pressures have been in the 032Z systolic but HR in the upper 40s today. Feels somewhat lightheaded at times. Advised to continue with Entresto and spiro and hold coreg this evening. Reduce coreg from 6.25mg  BID to 3.125mg  BID in the morning if HR >50 bpm. She voiced understanding of changes and thanked me for follow up call. Appt with [facility] in the next 2 weeks, to call back if further concerns prior to appt.   SignedReino Bellis, NP-C 12/26/2018, 6:46 PM

## 2018-12-27 ENCOUNTER — Telehealth (HOSPITAL_COMMUNITY): Payer: Self-pay

## 2018-12-27 ENCOUNTER — Other Ambulatory Visit: Payer: Self-pay

## 2018-12-27 ENCOUNTER — Ambulatory Visit (HOSPITAL_COMMUNITY)
Admission: RE | Admit: 2018-12-27 | Discharge: 2018-12-27 | Disposition: A | Discharge status: Home / Self Care | Payer: BC Managed Care – PPO | Source: Ambulatory Visit | Attending: Internal Medicine | Admitting: Internal Medicine

## 2018-12-27 VITALS — BP 146/88 | HR 41 | Wt 231.0 lb

## 2018-12-27 DIAGNOSIS — I160 Hypertensive urgency: Secondary | ICD-10-CM | POA: Diagnosis not present

## 2018-12-27 DIAGNOSIS — I5022 Chronic systolic (congestive) heart failure: Secondary | ICD-10-CM | POA: Insufficient documentation

## 2018-12-27 DIAGNOSIS — I110 Hypertensive heart disease with heart failure: Secondary | ICD-10-CM | POA: Diagnosis not present

## 2018-12-27 DIAGNOSIS — Z7984 Long term (current) use of oral hypoglycemic drugs: Secondary | ICD-10-CM | POA: Diagnosis not present

## 2018-12-27 DIAGNOSIS — R008 Other abnormalities of heart beat: Secondary | ICD-10-CM | POA: Diagnosis not present

## 2018-12-27 DIAGNOSIS — I447 Left bundle-branch block, unspecified: Secondary | ICD-10-CM | POA: Diagnosis not present

## 2018-12-27 DIAGNOSIS — Z79899 Other long term (current) drug therapy: Secondary | ICD-10-CM | POA: Insufficient documentation

## 2018-12-27 DIAGNOSIS — R001 Bradycardia, unspecified: Secondary | ICD-10-CM | POA: Diagnosis not present

## 2018-12-27 DIAGNOSIS — E1165 Type 2 diabetes mellitus with hyperglycemia: Secondary | ICD-10-CM | POA: Diagnosis not present

## 2018-12-27 DIAGNOSIS — I493 Ventricular premature depolarization: Secondary | ICD-10-CM

## 2018-12-27 DIAGNOSIS — R0683 Snoring: Secondary | ICD-10-CM | POA: Insufficient documentation

## 2018-12-27 DIAGNOSIS — I509 Heart failure, unspecified: Secondary | ICD-10-CM | POA: Diagnosis not present

## 2018-12-27 DIAGNOSIS — R0681 Apnea, not elsewhere classified: Secondary | ICD-10-CM | POA: Insufficient documentation

## 2018-12-27 DIAGNOSIS — I428 Other cardiomyopathies: Secondary | ICD-10-CM | POA: Diagnosis not present

## 2018-12-27 DIAGNOSIS — R5383 Other fatigue: Secondary | ICD-10-CM | POA: Diagnosis not present

## 2018-12-27 LAB — CBC
HCT: 47.5 % (ref 39.0–52.0)
Hemoglobin: 15.8 g/dL (ref 13.0–17.0)
MCH: 30.3 pg (ref 26.0–34.0)
MCHC: 33.3 g/dL (ref 30.0–36.0)
MCV: 91.2 fL (ref 80.0–100.0)
Platelets: 204 10*3/uL (ref 150–400)
RBC: 5.21 MIL/uL (ref 4.22–5.81)
RDW: 12.3 % (ref 11.5–15.5)
WBC: 4.7 10*3/uL (ref 4.0–10.5)
nRBC: 0 % (ref 0.0–0.2)

## 2018-12-27 LAB — COMPREHENSIVE METABOLIC PANEL
ALT: 21 U/L (ref 0–44)
AST: 19 U/L (ref 15–41)
Albumin: 3.5 g/dL (ref 3.5–5.0)
Alkaline Phosphatase: 78 U/L (ref 38–126)
Anion gap: 10 (ref 5–15)
BUN: 18 mg/dL (ref 6–20)
CO2: 26 mmol/L (ref 22–32)
Calcium: 9.7 mg/dL (ref 8.9–10.3)
Chloride: 99 mmol/L (ref 98–111)
Creatinine, Ser: 1.06 mg/dL (ref 0.61–1.24)
GFR calc Af Amer: >60 mL/min (ref >60)
GFR calc non Af Amer: >60 mL/min (ref >60)
Glucose, Bld: 333 mg/dL — ABNORMAL HIGH (ref 70–99)
Potassium: 5 mmol/L (ref 3.5–5.1)
Sodium: 135 mmol/L (ref 135–145)
Total Bilirubin: 0.8 mg/dL (ref 0.3–1.2)
Total Protein: 6.4 g/dL — ABNORMAL LOW (ref 6.5–8.1)

## 2018-12-27 LAB — BRAIN NATRIURETIC PEPTIDE: B Natriuretic Peptide: 163.3 pg/mL — ABNORMAL HIGH (ref 0.0–100.0)

## 2018-12-27 LAB — MAGNESIUM: Magnesium: 1.7 mg/dL (ref 1.7–2.4)

## 2018-12-27 MED ORDER — AMIODARONE HCL 200 MG PO TABS: 200.0000 mg | ORAL_TABLET | Freq: Two times a day (BID) | ORAL | 3 refills | Status: DC

## 2018-12-27 NOTE — Progress Notes (Signed)
Order form for [facility] completed and signed by Dr Haroldine Laws and faxed along with demographic info, insurance card and echo.  Spoke w/rep Dorian Pod and she is aware of need and will expedite this so pt get it placed today.  Advised will fax OV note once Dr Haroldine Laws completes.

## 2018-12-27 NOTE — Patient Instructions (Signed)
Labs were done today. We will call you with any ABNORMAL results. No news is good news!  EKG was completed.   HOLD CARVEDILOL FOR NOW.  BEGIN Amiodarone 200 mg (1 tab) twice a day. PLEASE BEGIN THIS MEDICATION TODAY.  Your physician has requested that you have a cardiac MRI. Cardiac MRI uses a computer to create images of your heart as its beating, producing both still and moving pictures of your heart and major blood vessels. For further information please visit [email or web address] Please follow the instruction sheet given to you today for more information.  You have been referred to complete an at home sleep study, this office will call to schedule this with you.  We have faxed the request for a LifeVest ASAP, they will reach out to you to have this placed.   Your physician recommends that you schedule a follow-up appointment in: 6 weeks.   At the [facility], you and your health needs are our priority. As part of our continuing mission to provide you with exceptional heart care, we have created designated Provider Care Teams. These Care Teams include your primary Cardiologist (physician) and Advanced Practice Providers (APPs- Physician Assistants and Nurse Practitioners) who all work together to provide you with the care you need, when you need it.   You may see any of the following providers on your designated Care Team at your next follow up: Marland Kitchen Dr Glori Bickers . Dr Loralie Champagne . Darrick Grinder, NP

## 2018-12-27 NOTE — Care Management Note (Signed)
Patient Name:         DOB:       Height:     Weight:  Office Name:         Referring Provider:  Today's Date:  Date:   STOP BANG RISK ASSESSMENT S (snore) Have you been told that you snore?     YES   T (tired) Are you often tired, fatigued, or sleepy during the day?   YES  O (obstruction) Do you stop breathing, choke, or gasp during sleep? YES   P (pressure) Do you have or are you being treated for high blood pressure? YES   B (BMI) Is your body index greater than 35 kg/m? NO   A (age) Are you 27 years old or older? YES   N (neck) Do you have a neck circumference greater than 16 inches?   YES   G (gender) Are you a male? YES   TOTAL STOP/BANG "YES" ANSWERS 7                                                                       For Office Use Only              Procedure Order Form    YES to 3+ Stop Bang questions OR two clinical symptoms - patient qualifies for WatchPAT (CPT 95800)     Submit: This Form + Patient Face Sheet + Clinical Note via CloudPAT or Fax: [phone]         Clinical Notes: Will consult Sleep Specialist and refer for management of therapy due to patient increased risk of Sleep Apnea. Ordering a sleep study due to the following two clinical symptoms: Excessive daytime sleepiness G47.10 / Gastroesophageal reflux K21.9 / Nocturia R35.1 / Morning Headaches G44.221 / Difficulty concentrating R41.840 / Memory problems or poor judgment G31.84 / Personality changes or irritability R45.4 / Loud snoring R06.83 / Depression F32.9 / Unrefreshed by sleep G47.8 / Impotence N52.9 / History of high blood pressure R03.0 / Insomnia G47.00    I understand that I am proceeding with a home sleep apnea test as ordered by my treating physician. I understand that untreated sleep apnea is a serious cardiovascular risk factor and it is my responsibility to perform the test and seek management for sleep apnea. I will be contacted with the results and be managed for sleep apnea by  a local sleep physician. I will be receiving equipment and further instructions from [facility]. I shall promptly ship back the equipment via the included mailing label. I understand my insurance will be billed for the test and as the patient I am responsible for any insurance related out-of-pocket costs incurred. I have been provided with written instructions and can call for additional video or telephonic instruction, with 24-hour availability of qualified personnel to answer any questions: Patient Help Desk [phone].  Patient Signature ______________________________________________________   Date______________________ Patient Telemedicine Verbal Consent

## 2018-12-27 NOTE — Progress Notes (Signed)
ADVANCED HF CLINIC NOTE  HPI:  [name] is a 53 year old male with a history of HTN and poorly controlled diabetes who was recently admitted to [facility] from 7/5-77/20 with hypertensive urgency and acute systolic HF. ECG showed LBBB of uncertain duration.  He was admitted to the ICU and placed on IV NTG and underwent diuresis. Echocardiogram showed severe systolic dysfunction with EF 25-30%.   Cardiac catheterization on 12/16/18 showed normal coronary arteries with well compensated filling pressures after diuresis.   RA = 4 RV = 34/3 PA = 31/8 (19) PCWP =  11 Fick = 5.1/2.24  He was started on GDMT including carvedilol 6.25 bid, Entresto 49/51 bid, spironolactone 12.5 daily. He did well for several days but over the last few days has been much more fatigued. He has been taking BP regularly and his SBP has been in the 120 range but notes his HR is often in the 40s. He spoke with our HF pharmacist yesterday who suggested he hold his carvedilol and come in for evaluation.  He denies edema, orthopnea, PND, CP or palpitations. He does snore on occasion and also has had witnessed apnea.   SH:  Social History   Socioeconomic History  . Marital status: Unknown    Spouse name: Not on file  . Number of children: Not on file  . Years of education: Not on file  . Highest education level: Not on file  Occupational History  . Not on file  Social Needs  . Financial resource strain: Not on file  . Food insecurity    Worry: Not on file    Inability: Not on file  . Transportation needs    Medical: Not on file    Non-medical: Not on file  Tobacco Use  . Smoking status: Not on file  Substance and Sexual Activity  . Alcohol use: Not on file  . Drug use: Not on file  . Sexual activity: Not on file  Lifestyle  . Physical activity    Days per week: Not on file    Minutes per session: Not on file  . Stress: Not on file  Relationships  . Social Herbalist on  phone: Not on file    Gets together: Not on file    Attends religious service: Not on file    Active member of club or organization: Not on file    Attends meetings of clubs or organizations: Not on file    Relationship status: Not on file  . Intimate partner violence    Fear of current or ex partner: Not on file    Emotionally abused: Not on file    Physically abused: Not on file    Forced sexual activity: Not on file  Other Topics Concern  . Not on file  Social History Narrative  . Not on file    FH: No family history on file.  Past Medical History:  Diagnosis Date  . Diabetes mellitus without complication ([place])     Current Outpatient Medications  Medication Sig Dispense Refill  . carvedilol (COREG) 6.25 MG tablet Take 1 tablet (6.25 mg total) by mouth 2 (two) times daily with a meal. 60 tablet 6  . glipiZIDE (GLUCOTROL) 10 MG tablet Take 1 tablet (10 mg total) by mouth 2 (two) times a day. Further refills by PCP 60 tablet 1  . metFORMIN (GLUCOPHAGE) 500 MG tablet Take 2 tablets (1,000 mg total) by  mouth 2 (two) times daily with a meal. 60 tablet 1  . sacubitril-valsartan (ENTRESTO) 49-51 MG Take 1 tablet by mouth 2 (two) times daily. 60 tablet 6  . spironolactone (ALDACTONE) 25 MG tablet Take 0.5 tablets (12.5 mg total) by mouth daily. 60 tablet 6  . amiodarone (PACERONE) 200 MG tablet Take 1 tablet (200 mg total) by mouth 2 (two) times daily. 90 tablet 3  . OVER THE COUNTER MEDICATION Take 1 tablet by mouth 2 (two) times a day. Vitamin for diabatic     No current facility-administered medications for this encounter.     Vitals:   12/27/18 1125  BP: (!) 146/88  Pulse: (!) 41  SpO2: 98%  Weight: 104.8 kg (231 lb)    PHYSICAL EXAM:  General:  Well appearing. No resp difficulty HEENT: normal Neck: supple. JVP flat. Carotids 2+ bilaterally; no bruits. No lymphadenopathy or thryomegaly appreciated. Cor: PMI normal. Irregular rate and rhythm No rubs, gallops or murmurs.  Lungs: clear Abdomen: obese soft, nontender, nondistended. No hepatosplenomegaly. No bruits or masses. Good bowel sounds. Extremities: no cyanosis, clubbing, rash, edema Neuro: alert & orientedx3, cranial nerves grossly intact. Moves all 4 extremities w/o difficulty. Affect pleasant.   ECG: NSR 87 with LBBB (136ms) and frequent PVCs with bigeminy. Personally reviewed   ASSESSMENT & PLAN:  1. Acute systolic HF - diagnosed earlier this month with severe NICM with EF ~25-30% - cath 7/20 with normal coronaries and compensated hemodynamics - suspect he likely has PVC-induced CM but may also be related to LBBB or poorly-controlled HTN - now with NYHA III symptoms in setting of functional bradycardia due to frequent PVCs/bigeminy - will stop carvedilol for now - start amio 200 bid - Volume status looks ok - continue spiro, Entresto - plan cMRI to evaluate for scar or infiltrative process - given EF ~25% with frequent multifocal PVCs I worry he is at very high-risk for SCD over the next few months. Will placed LifeVest - hopefully EF will improved with suppression of PVCs, if not may be candidate for CRT with LBBB  2. Frequent PVCs - suspect may be related to undiagnosed OSA - will order home sleep study - check electrolytes  - cMRI to exclude infiltrative disease - will suppress with amio 200 bid  3. HTN - BP high here but well controlled on home cuff  4. LBBB - discussion as above  5. Poorly controlled DM2 - Per his PCP.  - Eventually consider SGLT2i once HgBA1c < 10   Total time spent 45 minutes. Over half that time spent discussing above.   Glori Bickers, MD  4:59 PM

## 2018-12-27 NOTE — Telephone Encounter (Signed)
Received call from pt wife stating that the patient's heart rate is between 38-60 via pulse ox. Pt is complaining of dizziness and low energy. Wife spoke with the nurse practitioner at another cardiology office and they suggested that he not take evening dose of carvedilol and to cut current dose in half starting today. The wife spoke to her sister "who is an Therapist, sports" who suggested that he not take any Entresto nor Spiro nor Carvedilol at all. The wife called and said he isnt going to take anything until they hear back from [place]. Wife said pt bp is unreadable on the home cuff. Will bring in pt for nurse visit for a bp check/ekg. Spoke with pt to relay visit info and he states his bp is 124/61 this morning but that he did have trouble obtaining it.

## 2018-12-28 DIAGNOSIS — I420 Dilated cardiomyopathy: Secondary | ICD-10-CM | POA: Diagnosis not present

## 2018-12-30 ENCOUNTER — Encounter: Payer: Self-pay | Admitting: Critical Care Medicine

## 2018-12-30 NOTE — Progress Notes (Deleted)
Patient ID: Kevin Hampton, male   DOB: 04/18/1966, 53 y.o.   MRN: [id number] Virtual Visit via Telephone Note  I connected with Kevin Hampton on 12/30/18 at  2:30 PM EDT by telephone and verified that I am speaking with the correct person using two identifiers.   Consent:  I discussed the limitations, risks, security and privacy concerns of performing an evaluation and management service by telephone and the availability of in person appointments. I also discussed with the patient that there may be a patient responsible charge related to this service. The patient expressed understanding and agreed to proceed.  Location of patient:  Location of provider:  Persons participating in the televisit with the patient.       History of Present Illness: Seen post d/c for STEMI Admit date: 12/15/2018 Discharge date: 12/17/2018  Primary Care Provider: No primary care provider on file.  Primary Cardiologist: Dr. Haroldine Hampton (likely long term follow up in [facility])  Discharge Diagnoses    Active Problems:   Acute pulmonary edema (Kevin Hampton)   Acute CHF (congestive heart failure) (Kevin Hampton)   Hypertensive emergency   NICM   DM  Allergies No Known Allergies  Diagnostic Studies/Procedures    RIGHT/LEFT HEART CATH AND CORONARY ANGIOGRAPHY 12/16/2018 IMPRESSION:[name] has clean coronary arteries with [place] slightly depressed cardiac output and fairly normal filling pressures probably as result of diuresis. He will need treatment for nonischemic cardiomyopathy with Entresto, spironolactone, and carvedilol. The sheaths were removed and a TR band was placed on the right wrist to achieve patent hemostasis. The patient left lab in stable condition. Fick Cardiac Output 5.1 L/min  Fick Cardiac Output Index 2.24 (L/min)/BSA  RA A Wave 4 mmHg  RA V Wave 2 mmHg  RA Mean 1 mmHg  RV Systolic Pressure 34 mmHg  RV Diastolic Pressure 1 mmHg  RV EDP 4 mmHg  PA Systolic Pressure 31 mmHg  PA Diastolic Pressure 8  mmHg  PA Mean 19 mmHg  PW A Wave 14 mmHg  PW V Wave 12 mmHg  PW Mean 11 mmHg  LV Systolic Pressure 585 mmHg  LV Diastolic Pressure 13 mmHg  LV EDP 17 mmHg  AOp Systolic Pressure 277 mmHg  AOp Diastolic Pressure 82 mmHg  AOp Mean Pressure 824 mmHg  LVp Systolic Pressure 235 mmHg  LVp Diastolic Pressure 12 mmHg  LVp EDP Pressure 16 mmHg  QP/QS 1  TPVR Index 8.49 HRUI     Echocardiogram 12/15/2018 IMPRESSIONS 1. The left ventricle has severely reduced systolic function, with an ejection fraction of 25-30%. The cavity size was moderately dilated. There is mildly increased left ventricular wall thickness. Left ventricular diastolic Doppler parameters are  consistent with impaired relaxation. 2. The right ventricle has normal systolic function. The cavity was normal. 3. Left atrial size was mildly dilated. 4. The mitral valve is grossly normal. There is mild mitral annular calcification present. 5. The tricuspid valve is grossly normal. 6. The aortic valve was not well visualized. Aortic valve regurgitation is trivial by color flow Doppler. 7. Severe global reduction in LV systolic function; moderate LVE; mild LVH; mild diastolic dysfunction; trace AI; mild LAE   History of Present Illness     [name] is a 53 year old male with a history of diabetes not requiring insulin, hypertension who was transferred from [facility] for evaluation of hypertensive emergency acute pulmonary edema with heart failure and possible STEMI. However EKG shows left bundle branch block and not consistent with STEMI. STEMI activation was canceled.  He was given IV Lasix and started on nitroglycerin drip in the ER with improvement in dyspnea.   No prior cardiac history. About 1 month ago, he started to notice some mild shortness of breath at home which seem to be worse with exertion but overall did not bother him too much. However about 1 week ago the shortness of breath increased in severity  and he has had difficulty breathing with almost any activity and difficulty lying flat. He denied significant leg swelling or chest pain. He has a history of hypertension that was treated with lisinopril a few years ago however he said that he was able to stop the lisinopril because the blood pressure got too low and he did not need the medicine. He has never required insulin for his diabetes. He takes metformin and glipizide.  Hiis mother had a heart attack in the past but otherwise denies any significant cardiac family history.He smoked rarely as a teenager but is otherwise a non-smoker. He notes significant weight loss of about 80 pounds over the past 1 to 2 years associated with developing diabetes.  Hospital Course     Consultants: None  1: Hypertensive urgency-blood pressure improved on IV nitroglycerin, wean off. Started on Entresto and spironolactone.  2: NICM/Acute systolic CHF-His chest x-ray showed pulmonary edema. LVEF EF 20 to 25% by 2D echo with a dilated left ventricle and multiple wall motion normalities. He had right and left heart cath performed by Dr. Gwenlyn Hampton revealed clean coronary arteries with fairly normal filling pressures as a result of diuresis. Diuresed -1.2L. weight stable at 222-223lb. He denied recurrent shortness of breath and felt clinically improved and back to baseline. Ambulated well without any issue. LDL 81. Tolerating Coreg, Entresto and spironolactone.   3: Uncontrolled diabetes-treated with sliding scale insulin and glipizide while in hospital. HgbA1c 12.7. He has not been following a diabetes diet, eats whatever he wants and drinks regular sodas 2-3 times/week. The patient has been followed by Kevin Hampton.  Declined insuline at discharge. He will continue Metformin and Glipizide. He will establish care with PCP. Arranged by Case Manager.   The patient been seen by Dr. Gwenlyn Hampton today and deemed ready for discharge home. All follow-up  appointments have been scheduled. Discharge medications are listed below.   Discharge Vitals Blood pressure 118/82, pulse 79, temperature 98.9 F (37.2 C), temperature source Oral, resp. rate 20, height 6\' 3"  (1.905 m), weight 101.6 kg, SpO2 97 %.       Filed Weights   12/15/18 0622 12/16/18 0500 12/17/18 0500  Weight: 106.2 kg 99.4 kg 101.6 kg      Observations/Objective:   Assessment and Plan:   Follow Up Instructions:    I discussed the assessment and treatment plan with the patient. The patient was provided an opportunity to ask questions and all were answered. The patient agreed with the plan and demonstrated an understanding of the instructions.   The patient was advised to call back or seek an in-person evaluation if the symptoms worsen or if the condition fails to improve as anticipated.  I provided *** minutes of non-face-to-face time during this encounter  including  median intraservice time , review of notes, labs, imaging, medications  and explaining diagnosis and management to the patient .    Asencion Noble, MD

## 2018-12-31 ENCOUNTER — Encounter: Payer: Self-pay | Admitting: Critical Care Medicine

## 2018-12-31 ENCOUNTER — Ambulatory Visit (INDEPENDENT_AMBULATORY_CARE_PROVIDER_SITE_OTHER): Payer: BC Managed Care – PPO | Admitting: Critical Care Medicine

## 2018-12-31 ENCOUNTER — Ambulatory Visit: Payer: Self-pay | Admitting: Family Medicine

## 2018-12-31 ENCOUNTER — Other Ambulatory Visit: Payer: Self-pay

## 2018-12-31 VITALS — BP 129/83 | HR 74 | Temp 97.5°F | Resp 17 | Ht 75.0 in | Wt 233.4 lb

## 2018-12-31 DIAGNOSIS — J810 Acute pulmonary edema: Secondary | ICD-10-CM

## 2018-12-31 DIAGNOSIS — E11630 Type 2 diabetes mellitus with periodontal disease: Secondary | ICD-10-CM

## 2018-12-31 DIAGNOSIS — I5021 Acute systolic (congestive) heart failure: Secondary | ICD-10-CM

## 2018-12-31 DIAGNOSIS — R635 Abnormal weight gain: Secondary | ICD-10-CM

## 2018-12-31 DIAGNOSIS — I10 Essential (primary) hypertension: Secondary | ICD-10-CM

## 2018-12-31 DIAGNOSIS — H6123 Impacted cerumen, bilateral: Secondary | ICD-10-CM | POA: Insufficient documentation

## 2018-12-31 DIAGNOSIS — R0601 Orthopnea: Secondary | ICD-10-CM

## 2018-12-31 DIAGNOSIS — E669 Obesity, unspecified: Secondary | ICD-10-CM

## 2018-12-31 MED ORDER — SPIRONOLACTONE 25 MG PO TABS: 25.0000 mg | ORAL_TABLET | Freq: Every day | ORAL | 6 refills | Status: DC

## 2018-12-31 MED ORDER — JARDIANCE 10 MG PO TABS: 10.0000 mg | ORAL_TABLET | Freq: Every day | ORAL | 3 refills | Status: DC

## 2018-12-31 MED ORDER — SACUBITRIL-VALSARTAN 49-51 MG PO TABS: 1.0000 | ORAL_TABLET | Freq: Two times a day (BID) | ORAL | 6 refills | Status: DC

## 2018-12-31 MED ORDER — METFORMIN HCL 500 MG PO TABS: 1000.0000 mg | ORAL_TABLET | Freq: Two times a day (BID) | ORAL | 1 refills | Status: DC

## 2018-12-31 MED ORDER — GLIPIZIDE 10 MG PO TABS: 10.0000 mg | ORAL_TABLET | Freq: Two times a day (BID) | ORAL | 2 refills | Status: DC

## 2018-12-31 NOTE — Assessment & Plan Note (Addendum)
Diabetes type 2 diagnosed in 2013 last A1c was at 11  We will recheck hemoglobin A1c and complete metabolic panel  May yet require long-acting insulin  Continue metformin and glipizide

## 2018-12-31 NOTE — Patient Instructions (Signed)
Begin Jardiance 1 tablet daily  Increase Aldactone to 1 tablet daily  No other medication changes  [facility] in 1 month [facility]

## 2018-12-31 NOTE — Progress Notes (Signed)
Subjective:    Patient ID: Kevin Hampton, male    DOB: [birth year], 53 y.o.   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  The patient had a heart catheterization which showed clean coronary arteries therefore no evidence of ischemic cardiomyopathy.  The patient was discharged on Entresto, spironolactone, and Coreg at all.  Patient had his old LifeVest placed as well.  He is had extra PVCs that are incessant and is now on amiodarone to control this.  The patient is already been back to see cardiology post hospital on 17 July.  At that visit his heart rate was 41 and his Coreg was discontinued.  The patient maintains a very low dose of Aldactone and cardiology felt he was volume stable.  The patient notes however that he is having increased orthopnea and increased weight gain since his discharge from the hospital.  He is in fact up 10 full pounds since his discharge.  The patient currently does not complain of any chest pain.  There is no shortness of breath with exertion noted. Below is the discharge summary from his hospitalization  Admit date: 12/15/2018 Discharge date: 12/17/2018  Primary Care Provider: No primary care provider on file.  Primary Cardiologist: [name] (likely long term follow up in [facility])  Discharge Diagnoses   Active Problems:   Acute pulmonary edema ([place])   Acute CHF (congestive heart failure) ([place])   Hypertensive emergency   NICM   DM  Allergies No Known Allergies    History of Present Illness    Kevin Hampton is a 53 year old male with a history of diabetes not requiring insulin, hypertension who was [facility] evaluation of hypertensive emergency acute pulmonary edema with heart failure and possible STEMI. However EKG shows left bundle  branch block and not consistent with STEMI. STEMI activation was canceled. He was given IV Lasix and started on nitroglycerin drip in the ER with improvement in dyspnea.   No prior cardiac history. About 1 month ago, he started to notice some mild shortness of breath at home which seem to be worse with exertion but overall did not bother him too much. However about 1 week ago the shortness of breath increased in severity and he has had difficulty breathing with almost any activity and difficulty lying flat. He denied significant leg swelling or chest pain. He has a history of hypertension that was treated with lisinopril a few years ago however he said that he was able to stop the lisinopril because the blood pressure got too low and he did not need the medicine. He has never required insulin for his diabetes. He takes metformin and glipizide.  Hiis mother had a heart attack in the past but otherwise denies any significant cardiac family history.He smoked rarely as a teenager but is otherwise a non-smoker. He notes significant weight loss of about 80 pounds over the past 1 to 2 years associated with developing diabetes.  Hospital Course    Consultants: None  1: Hypertensive urgency-blood pressure improved on IV nitroglycerin, wean off. Started on Entresto and spironolactone.  2: NICM/Acute systolic CHF-His chest x-ray showed pulmonary edema. LVEF EF 20 to 25% by 2D echo with a dilated left ventricle and multiple wall motion normalities. He had right and left heart cath performed by  Dr. Gwenlyn Found revealed clean coronary arteries with fairly normal filling pressures as a result of diuresis. Diuresed -1.2L. weight stable at 222-223lb. He denied recurrent shortness of breath and felt clinically improved and back to baseline. Ambulated well without any issue. LDL 81. Tolerating Coreg, Entresto and spironolactone.   3: Uncontrolled diabetes-treated with sliding scale insulin and glipizide while  in hospital. HgbA1c 12.7. He has not been following a diabetes diet, eats whatever he wants and drinks regular sodas 2-3 times/week. The patient has been followed by Diabetic coordinator.  Declined insuline at discharge. He will continue Metformin and Glipizide. He will establish care with PCP. Arranged by Case Manager.   The patient been seen by Dr. Gwenlyn Found today and deemed ready for discharge home. All follow-up appointments have been scheduled. Discharge medications are listed below.   Discharge Vitals Blood pressure 118/82, pulse 79, temperature 98.9 F (37.2 C), temperature source Oral, resp. rate 20, height 6\' 3"  (1.905 m), weight 101.6 kg, SpO2 97 %.      Filed Weights  12/15/18 0622 12/16/18 0500 12/17/18 0500 Weight: 106.2 kg 99.4 kg 101.6 kg  The patient now currently lives in [place].  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  He has been a diabetic for 9 years and does have a testing meter his A1c recently is up to 11.  CBG is now around 240 in the fasting range.  He does not check postprandially.  He is now changed his diet significantly and is hoping to destress his life and improve his diet and exertional capacity.  Note initially his ejection fraction was in the 25 to 30% range.  The patient denies any edema at this time.  Wt Readings from Last 3 Encounters: 12/31/18 : 233 lb 6.4 oz (105.9 kg) 12/27/18 : 231 lb (104.8 kg) 12/17/18 : 223 lb 15.8 oz (101.6 kg)  Past Medical History:  Diagnosis Date  . Essential hypertension   . Hypertensive emergency 12/15/2018  . Obesity   . Type 2 diabetes mellitus with complication (HCC)      Family History  Problem Relation Age of Onset  . Hypertension Mother   . Heart attack Mother      Social History   Socioeconomic History  . Marital  status: Unknown    Spouse name: Not on file  . Number of children: Not on file  . Years of education: Not on file  . Highest education level: Not on file  Occupational History  . Not on file  Social Needs  . Financial resource strain: Not on file  . Food insecurity    Worry: Not on file    Inability: Not on file  . Transportation needs    Medical: Not on file    Non-medical: Not on file  Tobacco Use  . Smoking status: Former Research scientist (life sciences)  . Smokeless tobacco: Never Used  Substance and Sexual Activity  . Alcohol use: Not on file  . Drug use: Not on file  . Sexual activity: Not on file  Lifestyle  . Physical activity    Days per week: Not on file    Minutes per session: Not on file  . Stress: Not on file  Relationships  . Social connections    Talks on phone: Not on file  Gets together: Not on file    Attends religious service: Not on file    Active member of club or organization: Not on file    Attends meetings of clubs or organizations: Not on file    Relationship status: Not on file  . Intimate partner violence    Fear of current or ex partner: Not on file    Emotionally abused: Not on file    Physically abused: Not on file    Forced sexual activity: Not on file  Other Topics Concern  . Not on file  Social History Narrative  . Not on file     No Known Allergies   Outpatient Medications Prior to Visit  Medication Sig Dispense Refill  . amiodarone (PACERONE) 200 MG tablet Take 1 tablet (200 mg total) by mouth 2 (two) times daily. 90 tablet 3  . OVER THE COUNTER MEDICATION Take 1 tablet by mouth 2 (two) times a day. Vitamin for diabatic    . glipiZIDE (GLUCOTROL) 10 MG tablet Take 1 tablet (10 mg total) by mouth 2 (two) times a day. Further refills by PCP 60 tablet 1  . metFORMIN (GLUCOPHAGE) 500 MG tablet Take 2 tablets (1,000 mg total) by mouth 2 (two) times daily with a meal. 60 tablet 1  . sacubitril-valsartan (ENTRESTO) 49-51 MG Take 1 tablet by mouth 2 (two)  times daily. 60 tablet 6  . spironolactone (ALDACTONE) 25 MG tablet Take 0.5 tablets (12.5 mg total) by mouth daily. 60 tablet 6  . carvedilol (COREG) 6.25 MG tablet Take 1 tablet (6.25 mg total) by mouth 2 (two) times daily with a meal. 60 tablet 6   No facility-administered medications prior to visit.       Review of Systems  Constitutional: Positive for activity change.  HENT: Negative.   Eyes: Negative.   Respiratory: Negative for cough, choking, chest tightness, shortness of breath and wheezing.        Orthopnea   Cardiovascular: Positive for palpitations and leg swelling. Negative for chest pain.  Gastrointestinal: Negative.   Endocrine: Negative.   Genitourinary: Negative.   Musculoskeletal: Negative.   Neurological: Positive for weakness.  Psychiatric/Behavioral: Negative.        Objective:   Physical Exam Vitals:   12/31/18 1445  BP: 129/83  Pulse: 74  Resp: 17  Temp: (!) 97.5 F (36.4 C)  TempSrc: Temporal  SpO2: 97%  Weight: 233 lb 6.4 oz (105.9 kg)  Height: 6\' 3"  (1.905 m)    Gen: Pleasant, well-nourished, in no distress,  normal affect  ENT: Bilateral cerumen impaction left greater than right,  mouth clear,  oropharynx clear, no postnasal drip  Neck:1+  JVD, no TMG, no carotid bruits  Lungs: No use of accessory muscles, no dullness to percussion, clear without rales or rhonchi  Cardiovascular: RRR, heart sounds normal, no murmur or gallops, no peripheral edema  Abdomen: soft and NT, no HSM,  BS normal  Musculoskeletal: No deformities, no cyanosis or clubbing  Neuro: alert, non focal  Skin: Warm, no lesions or rashes  BMP Latest Ref Rng & Units 12/27/2018 12/17/2018 12/16/2018  Glucose 70 - 99 mg/dL 333(H) 245(H) -  BUN 6 - 20 mg/dL 18 18 -  Creatinine 0.61 - 1.24 mg/dL 1.06 0.87 -  Sodium 135 - 145 mmol/L 135 136 138  Potassium 3.5 - 5.1 mmol/L 5.0 3.8 3.8  Chloride 98 - 111 mmol/L 99 102 -  CO2 22 - 32 mmol/L 26 20(L) -  Calcium 8.9 -  10.3  mg/dL 9.7 9.0 -   CBC Latest Ref Rng & Units 12/27/2018 12/16/2018 12/16/2018  WBC 4.0 - 10.5 K/uL 4.7 - -  Hemoglobin 13.0 - 17.0 g/dL 15.8 16.7 17.0  Hematocrit 39.0 - 52.0 % 47.5 49.0 50.0  Platelets 150 - 400 K/uL 204 - -  Diagnostic Studies/Procedures   RIGHT/LEFT HEART CATH AND CORONARY ANGIOGRAPHY 12/16/2018 IMPRESSION:Kevin Hampton has clean coronary arteries with [place] slightly depressed cardiac output and fairly normal filling pressures probably as result of diuresis. He will need treatment for nonischemic cardiomyopathy with Entresto, spironolactone, and carvedilol. The sheaths were removed and a TR band was placed on the right wrist to achieve patent hemostasis. The patient left lab in stable condition. Fick Cardiac Output 5.1 L/min Fick Cardiac Output Index 2.24 (L/min)/BSA RA A Wave 4 mmHg RA V Wave 2 mmHg RA Mean 1 mmHg RV Systolic Pressure 34 mmHg RV Diastolic Pressure 1 mmHg RV EDP 4 mmHg PA Systolic Pressure 31 mmHg PA Diastolic Pressure 8 mmHg PA Mean 19 mmHg PW A Wave 14 mmHg PW V Wave 12 mmHg PW Mean 11 mmHg LV Systolic Pressure 294 mmHg LV Diastolic Pressure 13 mmHg LV EDP 17 mmHg AOp Systolic Pressure 765 mmHg AOp Diastolic Pressure 82 mmHg AOp Mean Pressure 465 mmHg LVp Systolic Pressure 035 mmHg LVp Diastolic Pressure 12 mmHg LVp EDP Pressure 16 mmHg QP/QS 1 TPVR Index 8.49 HRUI    Echocardiogram 12/15/2018 IMPRESSIONS 1. The left ventricle has severely reduced systolic function, with an ejection fraction of 25-30%. The cavity size was moderately dilated. There is mildly increased left ventricular wall thickness. Left ventricular diastolic Doppler parameters are  consistent with impaired relaxation. 2. The right ventricle has normal systolic function. The cavity was normal. 3. Left atrial size was mildly dilated. 4. The mitral valve is grossly normal. There is mild mitral annular calcification present. 5. The tricuspid valve is grossly normal. 6.  The aortic valve was not well visualized. Aortic valve regurgitation is trivial by color flow Doppler. 7. Severe global reduction in LV systolic function; moderate LVE; mild LVH; mild diastolic dysfunction; trace AI; mild LAE       Assessment & Plan:  I personally reviewed all images and lab data in the [facility] system as well as any outside material available during this office visit and agree with the  radiology impressions.   Acute CHF (congestive heart failure) (HCC) Systolic heart failure on the basis of chronic hypertension with normal coronaries and nonischemic cardiomyopathy Increased volume status Associated frequent PVCs  Plan Will increase Aldactone to 25 mg daily as volume status has increased Continue Entresto at current dose level Coreg has been discontinued due to bradycardia  HTN (hypertension) Hypertensive urgency now improved  Blood pressure control at goal at 129/80  Continue current cardiac medication program  Acute pulmonary edema (HCC) Acute pulmonary edema has now resolved  DM2 (diabetes mellitus, type 2) ([place]) Diabetes type 2 diagnosed in 2013 last A1c was at 11  We will recheck hemoglobin A1c and complete metabolic panel  May yet require long-acting insulin  Continue metformin and glipizide   Bilateral impacted cerumen Bilateral cerumen impaction left greater than right  Nurse cleared the patient's ears out today   Kevin Hampton was seen today for hospitalization follow-up.  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 -     metFORMIN (GLUCOPHAGE) 500 MG tablet; Take 2 tablets  (1,000 mg total) by mouth 2 (two) times daily with a meal. -     sacubitril-valsartan (ENTRESTO) 49-51 MG; Take 1 tablet by mouth 2 (two) times daily. -     spironolactone (ALDACTONE) 25 MG tablet; Take 1 tablet (25 mg total) by mouth daily. -     empagliflozin (JARDIANCE) 10 MG TABS tablet; Take 10 mg by mouth daily.

## 2018-12-31 NOTE — Assessment & Plan Note (Signed)
Bilateral cerumen impaction left greater than right  Nurse cleared the patient's ears out today

## 2018-12-31 NOTE — Assessment & Plan Note (Addendum)
Systolic heart failure on the basis of chronic hypertension with normal coronaries and nonischemic cardiomyopathy Increased volume status Associated frequent PVCs  Plan Will increase Aldactone to 25 mg daily as volume status has increased Continue Entresto at current dose level Coreg has been discontinued due to bradycardia

## 2018-12-31 NOTE — Assessment & Plan Note (Signed)
Hypertensive urgency now improved  Blood pressure control at goal at 129/80  Continue current cardiac medication program

## 2018-12-31 NOTE — Assessment & Plan Note (Signed)
Acute pulmonary edema has now resolved

## 2019-01-01 ENCOUNTER — Telehealth (HOSPITAL_COMMUNITY): Payer: Self-pay

## 2019-01-01 ENCOUNTER — Telehealth: Payer: Self-pay | Admitting: Critical Care Medicine

## 2019-01-01 MED ORDER — SPIRONOLACTONE 25 MG PO TABS: 12.5000 mg | ORAL_TABLET | Freq: Every day | ORAL | 6 refills | Status: DC

## 2019-01-01 NOTE — Telephone Encounter (Signed)
Pts wife called stating that patient saw PCP yesterday and was started on Jardiance and increased spiro to 25mg . Pts wife wanted to know if that was ok.  Spoke with Dr Haroldine Laws, as patient K was 5.0 at visit, would like to reduce Arlyce Harman back to 12.5mg .  In addition, Dr Joya Gaskins will continue to manage patient Jardiance per Dr Haroldine Laws.  Wife made aware of same and verbalized understanding.

## 2019-01-01 NOTE — Telephone Encounter (Signed)
I spoke to [name] who feels the Aldactone needs to be reduced back to 12 and half milligrams a day as the patient's potassium is 5  I spoke to the patient's spouse who will inform that of the patient and he had already been in contact with cardiology on this matter

## 2019-01-08 ENCOUNTER — Inpatient Hospital Stay (HOSPITAL_COMMUNITY): Admit: 2019-01-08 | Payer: Self-pay

## 2019-01-09 ENCOUNTER — Encounter (INDEPENDENT_AMBULATORY_CARE_PROVIDER_SITE_OTHER): Payer: BC Managed Care – PPO | Admitting: Cardiology

## 2019-01-09 DIAGNOSIS — I5021 Acute systolic (congestive) heart failure: Secondary | ICD-10-CM

## 2019-01-09 DIAGNOSIS — G4710 Hypersomnia, unspecified: Secondary | ICD-10-CM | POA: Diagnosis not present

## 2019-01-09 DIAGNOSIS — I10 Essential (primary) hypertension: Secondary | ICD-10-CM | POA: Diagnosis not present

## 2019-01-14 ENCOUNTER — Ambulatory Visit: Payer: BC Managed Care – PPO

## 2019-01-14 NOTE — Procedures (Signed)
    Patient Information Name: Ardit Danh ID: [id number] Birth Date: Feb 10, 1966  Age: 53  Gender: Male Study Date: 01/09/2019 Referring Physician:  Pierre Bali, MD  TEST DESCRIPTION: Home sleep apnea testing was completed using the WatchPat, a Type 1 device, utilizing peripheral arterial tonometry (PAT), chest movement, actigraphy, pulse oximetry, pulse rate, body position and snore. AHI was calculated with apnea and hypopnea using valid sleep time as the denominator. RDI includes apneas,hypopneas, and RERAs. The data acquired and the scoring of sleep and all associated events were performed in accordance with the recommended standards and specifications as outlined in the AASM Manual for the Scoring of Sleep and Associated Events 2.2.0 (2015).  DIAGNOSIS: 1. Normal study with no significant sleep disordered breathing (AHI 3/hr). 2. Minimum O2 sat 90%. 3. Total sleep time 6hr 43 min. 4. Mild to moderate snoring. 5. Normal sleep onset latency. 6. Markedly prolonged REM onset latency at 180 min.  RECOMMENDATIONS: 1. Normal study with no significant sleep disordered breathing.  2. An ENT consultation which may be useful for specific causes of and possible treatment of bothersome snoring .  3. Weight loss may be of benefit in reducing the severity of snoring .   Electronically Signed by Fransico Him, MD, ABSM 01/14/2019

## 2019-01-17 ENCOUNTER — Telehealth: Payer: Self-pay | Admitting: *Deleted

## 2019-01-17 NOTE — Telephone Encounter (Signed)
-----   Message from Sueanne Margarita, MD sent at 01/14/2019  8:53 PM EDT ----- Please let patient know that sleep study showed no significant sleep apnea.

## 2019-01-17 NOTE — Telephone Encounter (Signed)
Informed patient of sleep study results and patient understanding was verbalized. Patient understands HIS sleep study showed no significant sleep apnea.  Pt is aware and agreeable to normal results.     

## 2019-01-28 DIAGNOSIS — I420 Dilated cardiomyopathy: Secondary | ICD-10-CM | POA: Diagnosis not present

## 2019-02-02 NOTE — Progress Notes (Signed)
Subjective:    Patient ID: Kevin Hampton, male    DOB: [birth year], 53 y.o.   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  The patient had a heart catheterization which showed clean coronary arteries therefore no evidence of ischemic cardiomyopathy.  The patient was discharged on Entresto, spironolactone, and Coreg at all.  Patient had his old LifeVest placed as well.  He is had extra PVCs that are incessant and is now on amiodarone to control this.  The patient is already been back to see cardiology post hospital on 17 July.  At that visit his heart rate was 41 and his Coreg was discontinued.  The patient maintains a very low dose of Aldactone and cardiology felt he was volume stable.  Since the last visit with which was July 21 the patient's breathing is improved and there is less shortness of breath and orthopnea.  The patient still has an increase in weight compared to prior values.  There is no chest pain.  There is no shortness of breath at this time.  At the last visit we maintain the Aldactone at 12-1/2 mg daily because of elevated potassium levels.  The patient was started on Jardiance and has had no difficulty with this and his blood sugars are come down to the low 100 range.  Note previous A1c early July was 12.7.   Past Medical History:  Diagnosis Date  . Essential hypertension   . Hypertensive emergency 12/15/2018  . Obesity   . Type 2 diabetes mellitus with complication (HCC)      Family History  Problem Relation Age of Onset  . Hypertension Mother   . Heart attack Mother      Social History   Socioeconomic History  . Marital status: Married    Spouse name: Not on file  . Number of children: Not on file  . Years of education: Not on file  . Highest education level: Not on  file  Occupational History  . Not on file  Social Needs  . Financial resource strain: Not on file  . Food insecurity    Worry: Not on file    Inability: Not on file  . Transportation needs    Medical: Not on file    Non-medical: Not on file  Tobacco Use  . Smoking status: Former Research scientist (life sciences)  . Smokeless tobacco: Never Used  Substance and Sexual Activity  . Alcohol use: Never    Frequency: Never  . Drug use: Never  . Sexual activity: Not on file  Lifestyle  . Physical activity    Days per week: Not on file    Minutes per session: Not on file  . Stress: Not on file  Relationships  . Social Herbalist on phone: Not on file    Gets together: Not on file    Attends religious service: Not on file    Active member of club or organization: Not on file    Attends meetings of clubs or organizations: Not on file    Relationship status: Not on file  . Intimate partner violence    Fear of current or ex partner: Not on file    Emotionally abused: Not on file    Physically abused: Not on file    Forced sexual activity:  Not on file  Other Topics Concern  . Not on file  Social History Narrative  . Not on file     No Known Allergies   Outpatient Medications Prior to Visit  Medication Sig Dispense Refill  . amiodarone (PACERONE) 200 MG tablet Take 1 tablet (200 mg total) by mouth 2 (two) times daily. 90 tablet 3  . empagliflozin (JARDIANCE) 10 MG TABS tablet Take 10 mg by mouth daily. 30 tablet 3  . glipiZIDE (GLUCOTROL) 10 MG tablet Take 1 tablet (10 mg total) by mouth 2 (two) times a day. 60 tablet 2  . metFORMIN (GLUCOPHAGE) 500 MG tablet Take 2 tablets (1,000 mg total) by mouth 2 (two) times daily with a meal. 120 tablet 1  . sacubitril-valsartan (ENTRESTO) 49-51 MG Take 1 tablet by mouth 2 (two) times daily. 60 tablet 6  . spironolactone (ALDACTONE) 25 MG tablet Take 0.5 tablets (12.5 mg total) by mouth daily. 15 tablet 6  . OVER THE COUNTER MEDICATION Take 1 tablet by  mouth 2 (two) times a day. Vitamin for diabatic     No facility-administered medications prior to visit.       Review of Systems  Constitutional: Positive for activity change.  HENT: Negative.   Eyes: Negative.   Respiratory: Negative for cough, choking, chest tightness, shortness of breath and wheezing.        Orthopnea   Cardiovascular: Positive for palpitations and leg swelling. Negative for chest pain.  Gastrointestinal: Negative.   Endocrine: Negative.   Genitourinary: Negative.   Musculoskeletal: Negative.   Neurological: Positive for weakness.  Psychiatric/Behavioral: Negative.        Objective:   Physical Exam Vitals:   02/03/19 1104 02/03/19 1105  BP: (!) 157/74 140/80  Pulse: 71   Temp: 98.9 F (37.2 C)   TempSrc: Oral   SpO2: 98%   Weight: 244 lb 12.8 oz (111 kg)   Height: 6\' 3"  (1.905 m)     Gen: Pleasant, well-nourished, in no distress,  normal affect  ENT: mild periodontal dz,  mouth clear,  oropharynx clear, no postnasal drip  Neck:1+  JVD, no TMG, no carotid bruits  Lungs: No use of accessory muscles, no dullness to percussion, clear without rales or rhonchi  Cardiovascular: RRR, heart sounds normal, no murmur or gallops, no peripheral edema  Abdomen: soft and NT, no HSM,  BS normal  Musculoskeletal: No deformities, no cyanosis or clubbing  Neuro: alert, non focal  Skin: Warm, no lesions or rashes FOOT EXAM NORMAL   BMP Latest Ref Rng & Units 12/27/2018 12/17/2018 12/16/2018  Glucose 70 - 99 mg/dL 333(H) 245(H) -  BUN 6 - 20 mg/dL 18 18 -  Creatinine 0.61 - 1.24 mg/dL 1.06 0.87 -  Sodium 135 - 145 mmol/L 135 136 138  Potassium 3.5 - 5.1 mmol/L 5.0 3.8 3.8  Chloride 98 - 111 mmol/L 99 102 -  CO2 22 - 32 mmol/L 26 20(L) -  Calcium 8.9 - 10.3 mg/dL 9.7 9.0 -   CBC Latest Ref Rng & Units 12/27/2018 12/16/2018 12/16/2018  WBC 4.0 - 10.5 K/uL 4.7 - -  Hemoglobin 13.0 - 17.0 g/dL 15.8 16.7 17.0  Hematocrit 39.0 - 52.0 % 47.5 49.0 50.0  Platelets  150 - 400 K/uL 204 - -  Diagnostic Studies/Procedures   RIGHT/LEFT HEART CATH AND CORONARY ANGIOGRAPHY 12/16/2018 IMPRESSION:Kevin Hampton has clean coronary arteries with [place] slightly depressed cardiac output and fairly normal filling pressures probably as result of diuresis. He will need treatment  for nonischemic cardiomyopathy with Entresto, spironolactone, and carvedilol. The sheaths were removed and a TR band was placed on the right wrist to achieve patent hemostasis. The patient left lab in stable condition. Fick Cardiac Output 5.1 L/min Fick Cardiac Output Index 2.24 (L/min)/BSA RA A Wave 4 mmHg RA V Wave 2 mmHg RA Mean 1 mmHg RV Systolic Pressure 34 mmHg RV Diastolic Pressure 1 mmHg RV EDP 4 mmHg PA Systolic Pressure 31 mmHg PA Diastolic Pressure 8 mmHg PA Mean 19 mmHg PW A Wave 14 mmHg PW V Wave 12 mmHg PW Mean 11 mmHg LV Systolic Pressure 99991111 mmHg LV Diastolic Pressure 13 mmHg LV EDP 17 mmHg AOp Systolic Pressure 123456 mmHg AOp Diastolic Pressure 82 mmHg AOp Mean Pressure 123XX123 mmHg LVp Systolic Pressure 123456 mmHg LVp Diastolic Pressure 12 mmHg LVp EDP Pressure 16 mmHg QP/QS 1 TPVR Index 8.49 HRUI    Echocardiogram 12/15/2018 IMPRESSIONS 1. The left ventricle has severely reduced systolic function, with an ejection fraction of 25-30%. The cavity size was moderately dilated. There is mildly increased left ventricular wall thickness. Left ventricular diastolic Doppler parameters are  consistent with impaired relaxation. 2. The right ventricle has normal systolic function. The cavity was normal. 3. Left atrial size was mildly dilated. 4. The mitral valve is grossly normal. There is mild mitral annular calcification present. 5. The tricuspid valve is grossly normal. 6. The aortic valve was not well visualized. Aortic valve regurgitation is trivial by color flow Doppler. 7. Severe global reduction in LV systolic function; moderate LVE; mild LVH; mild diastolic  dysfunction; trace AI; mild LAE  Sleep Study 12/2018: DIAGNOSIS: 1. Normal study with no significant sleep disordered breathing (AHI 3/hr). 2. Minimum O2 sat 90%. 3. Total sleep time 6hr 43 min. 4. Mild to moderate snoring. 5. Normal sleep onset latency. 6. Markedly prolonged REM onset latency at 180 min.      Assessment & Plan:  I personally reviewed all images and lab data in the [facility] system as well as any outside material available during this office visit and agree with the  radiology impressions.   Chronic systolic heart failure (HCC) Chronic systolic heart failure stable at this time  We will continue current dosage of Aldactone and Entresto  We will follow-up with a basic metabolic panel today  HTN (hypertension) Hypertension with reasonable control   No change in medication at this time  DM2 (diabetes mellitus, type 2) ([place]) Diabetes type 2 now on Jardiance metformin and glipizide with fasting sugars in the low 100 range  No evidence of DKA and will follow-up basic metabolic panel  Bilateral impacted cerumen Bilateral impacted cerumen now resolved   Kevin Hampton was seen today for follow-up.  Diagnoses and all orders for this visit:  Type 2 diabetes mellitus with periodontal disease, without long-term current use of insulin ([place]) -     Basic metabolic panel; Future -     Microalbumin, urine -     Basic metabolic panel  Colon cancer screening -     Fecal occult blood, imunochemical  Need for hepatitis C screening test -     Hepatitis C antibody  Essential hypertension  Chronic systolic heart failure (HCC)  Bilateral impacted cerumen   We will also obtain a hepatitis C screening study urine for microalbumin and as well colon cancer screening with a fecal blood occult Hemoccult

## 2019-02-03 ENCOUNTER — Ambulatory Visit: Payer: BC Managed Care – PPO | Attending: Critical Care Medicine | Admitting: Critical Care Medicine

## 2019-02-03 ENCOUNTER — Other Ambulatory Visit: Payer: Self-pay

## 2019-02-03 ENCOUNTER — Encounter: Payer: Self-pay | Admitting: Critical Care Medicine

## 2019-02-03 VITALS — BP 140/80 | HR 71 | Temp 98.9°F | Ht 75.0 in | Wt 244.8 lb

## 2019-02-03 DIAGNOSIS — I10 Essential (primary) hypertension: Secondary | ICD-10-CM

## 2019-02-03 DIAGNOSIS — E11630 Type 2 diabetes mellitus with periodontal disease: Secondary | ICD-10-CM | POA: Diagnosis not present

## 2019-02-03 DIAGNOSIS — I5022 Chronic systolic (congestive) heart failure: Secondary | ICD-10-CM

## 2019-02-03 DIAGNOSIS — Z1159 Encounter for screening for other viral diseases: Secondary | ICD-10-CM | POA: Diagnosis not present

## 2019-02-03 DIAGNOSIS — Z1211 Encounter for screening for malignant neoplasm of colon: Secondary | ICD-10-CM | POA: Diagnosis not present

## 2019-02-03 DIAGNOSIS — H6123 Impacted cerumen, bilateral: Secondary | ICD-10-CM

## 2019-02-03 NOTE — Assessment & Plan Note (Signed)
Bilateral impacted cerumen now resolved

## 2019-02-03 NOTE — Assessment & Plan Note (Signed)
Chronic systolic heart failure stable at this time  We will continue current dosage of Aldactone and Entresto  We will follow-up with a basic metabolic panel today

## 2019-02-03 NOTE — Assessment & Plan Note (Signed)
Hypertension with reasonable control   No change in medication at this time

## 2019-02-03 NOTE — Patient Instructions (Signed)
No change in medications at this time  Labs today include a urine for microalbumin, hepatitis screen, and a metabolic panel to check your kidney function and potassium  Work on getting an eye exam this year for your diabetes to have your retina checked and also have a dentist see you for dental hygiene  Return in 6 weeks to establish care for primary care

## 2019-02-03 NOTE — Assessment & Plan Note (Signed)
Diabetes type 2 now on Jardiance metformin and glipizide with fasting sugars in the low 100 range  No evidence of DKA and will follow-up basic metabolic panel

## 2019-02-04 LAB — BASIC METABOLIC PANEL
BUN/Creatinine Ratio: 11 (ref 9–20)
BUN: 14 mg/dL (ref 6–24)
CO2: 24 mmol/L (ref 20–29)
Calcium: 10.3 mg/dL — ABNORMAL HIGH (ref 8.7–10.2)
Chloride: 95 mmol/L — ABNORMAL LOW (ref 96–106)
Creatinine, Ser: 1.27 mg/dL (ref 0.76–1.27)
GFR calc Af Amer: 74 mL/min/{1.73_m2} (ref >59)
GFR calc non Af Amer: 64 mL/min/{1.73_m2} (ref >59)
Glucose: 407 mg/dL — ABNORMAL HIGH (ref 65–99)
Potassium: 5.2 mmol/L (ref 3.5–5.2)
Sodium: 138 mmol/L (ref 134–144)

## 2019-02-04 LAB — MICROALBUMIN, URINE: Microalbumin, Urine: 48.8 ug/mL

## 2019-02-04 LAB — HEPATITIS C ANTIBODY: Hep C Virus Ab: <0.1 s/co ratio (ref 0.0–0.9)

## 2019-02-06 ENCOUNTER — Other Ambulatory Visit: Payer: Self-pay

## 2019-02-09 NOTE — Progress Notes (Signed)
ADVANCED HF CLINIC NOTE  HPI:  [name] is a 53 year old male with a history of HTN and poorly controlled diabetes who was admitted to [facility] from 7/5-7/20 with hypertensive urgency and acute systolic HF. ECG showed LBBB of uncertain duration.  He was admitted to the ICU and placed on IV NTG and underwent diuresis. Echocardiogram showed severe systolic dysfunction with EF 25-30%. Cardiac catheterization on 12/16/18 showed normal coronary arteries with well compensated filling pressures after diuresis.   RA = 4 RV = 34/3 PA = 31/8 (19) PCWP =  11 Fick = 5.1/2.24  He was started on GDMT including carvedilol 6.25 bid, Entresto 49/51 bid, spironolactone 12.5 daily. He did well for several days but then developed severe fatigue. I saw him in Clinic on 12/27/18. He was in bigeminy. I stopped his b-blocker and started amio 200 bid.K 5.0 Mg 1.7.  LifeVest placed. cMRI ordered (not done yet).  He returns for HF f/u. Says he feels great. "I haven't felt this good in years." Remains active around the house. No CP or SOB. No edema, orthopnea or PND. No events on LifeVest.   SH:  Social History   Socioeconomic History  . Marital status: Married    Spouse name: Not on file  . Number of children: Not on file  . Years of education: Not on file  . Highest education level: Not on file  Occupational History  . Not on file  Social Needs  . Financial resource strain: Not on file  . Food insecurity    Worry: Not on file    Inability: Not on file  . Transportation needs    Medical: Not on file    Non-medical: Not on file  Tobacco Use  . Smoking status: Former Research scientist (life sciences)  . Smokeless tobacco: Never Used  Substance and Sexual Activity  . Alcohol use: Never    Frequency: Never  . Drug use: Never  . Sexual activity: Not on file  Lifestyle  . Physical activity    Days per week: Not on file    Minutes per session: Not on file  . Stress: Not on file  Relationships  . Social  Herbalist on phone: Not on file    Gets together: Not on file    Attends religious service: Not on file    Active member of club or organization: Not on file    Attends meetings of clubs or organizations: Not on file    Relationship status: Not on file  . Intimate partner violence    Fear of current or ex partner: Not on file    Emotionally abused: Not on file    Physically abused: Not on file    Forced sexual activity: Not on file  Other Topics Concern  . Not on file  Social History Narrative  . Not on file    FH:  Family History  Problem Relation Age of Onset  . Hypertension Mother   . Heart attack Mother     Past Medical History:  Diagnosis Date  . Essential hypertension   . Hypertensive emergency 12/15/2018  . Obesity   . Type 2 diabetes mellitus with complication [facility])     Current Outpatient Medications  Medication Sig Dispense Refill  . amiodarone (PACERONE) 200 MG tablet Take 1 tablet (200 mg total) by mouth 2 (two) times daily. 90 tablet 3  . empagliflozin (JARDIANCE) 10 MG TABS tablet Take 10  mg by mouth daily. 30 tablet 3  . glipiZIDE (GLUCOTROL) 10 MG tablet Take 1 tablet (10 mg total) by mouth 2 (two) times a day. 60 tablet 2  . metFORMIN (GLUCOPHAGE) 500 MG tablet Take 2 tablets (1,000 mg total) by mouth 2 (two) times daily with a meal. 120 tablet 1  . sacubitril-valsartan (ENTRESTO) 49-51 MG Take 1 tablet by mouth 2 (two) times daily. 60 tablet 6  . spironolactone (ALDACTONE) 25 MG tablet Take 0.5 tablets (12.5 mg total) by mouth daily. 15 tablet 6   No current facility-administered medications for this encounter.     Vitals:   02/10/19 1043  BP: 130/80  Pulse: 77  SpO2: 98%  Weight: 108.9 kg (240 lb)   PHYSICAL EXAM: General:  Well appearing. No resp difficulty HEENT: normal Neck: supple. no JVD. Carotids 2+ bilat; no bruits. No lymphadenopathy or thryomegaly appreciated. Cor: PMI nondisplaced. Mildly irregular. No rubs, gallops or  murmurs. Lungs: clear Abdomen: soft, nontender, nondistended. No hepatosplenomegaly. No bruits or masses. Good bowel sounds. Extremities: no cyanosis, clubbing, rash, edema Neuro: alert & orientedx3, cranial nerves grossly intact. moves all 4 extremities w/o difficulty. Affect pleasant  ECG: NSR with LBBB and frequent PVCs and couplets in pattern of quadrigemny. Personally reviewed   ASSESSMENT & PLAN:  1. Acute systolic HF     - diagnosed 7/20 EF ~25-30%. NICM - cath 7/20 with normal coronaries and compensated hemodynamics - suspect he likely has PVC-induced CM but may also be related to LBBB or poorly-controlled HTN - NYHA I-II - Volume status ok  - Off carvedilol for now can try and restart at next visit  - continue spiro 12.5 for now (watch with recent K 5.2) - continue Entresto 49/51 bid - will not increase with elevated potassium  - Continue Jardiance - plan cMRI to evaluate for scar or infiltrative process - Continue LifeVest. - Echo today done at bedside EF 25-30% with frequent PVCs and dyssynchrony. Hopefully EF will improved with suppression of PVCs, if not may be candidate for CRT-D with LBBB  2. Frequent PVCs - suspect may be related to undiagnosed OSA - PVCs still present but reduced from previous.      - continue amio 200 bid will decrease to 200 daily at next visit  - home sleep study pending - K ok. Will add mag oxide 200 daily - cMRI to exclude infiltrative disease  3. HTN - BP high here but well controlled on home cuff  4. LBBB - discussion as above  5. Poorly controlled DM2 - Per his PCP. HgBA1c was 12.7 in 7/20 - Continue Farxiga - CBGs still very high on labs but 119-170 at home - Will recheck HgBA1c today - May referral to Endo  Total time spent 40 minutes. Over half that time spent discussing above.    Glori Bickers, MD  5:37 PM

## 2019-02-10 ENCOUNTER — Encounter (HOSPITAL_COMMUNITY): Payer: Self-pay | Admitting: Internal Medicine

## 2019-02-10 ENCOUNTER — Other Ambulatory Visit: Payer: Self-pay

## 2019-02-10 ENCOUNTER — Ambulatory Visit (HOSPITAL_COMMUNITY)
Admission: RE | Admit: 2019-02-10 | Discharge: 2019-02-10 | Disposition: A | Discharge status: Home / Self Care | Payer: Self-pay | Source: Ambulatory Visit | Attending: Internal Medicine | Admitting: Internal Medicine

## 2019-02-10 VITALS — BP 130/80 | HR 77 | Wt 240.0 lb

## 2019-02-10 DIAGNOSIS — Z7984 Long term (current) use of oral hypoglycemic drugs: Secondary | ICD-10-CM | POA: Insufficient documentation

## 2019-02-10 DIAGNOSIS — Z87891 Personal history of nicotine dependence: Secondary | ICD-10-CM | POA: Insufficient documentation

## 2019-02-10 DIAGNOSIS — I5022 Chronic systolic (congestive) heart failure: Secondary | ICD-10-CM

## 2019-02-10 DIAGNOSIS — I447 Left bundle-branch block, unspecified: Secondary | ICD-10-CM | POA: Insufficient documentation

## 2019-02-10 DIAGNOSIS — I493 Ventricular premature depolarization: Secondary | ICD-10-CM

## 2019-02-10 DIAGNOSIS — Z79899 Other long term (current) drug therapy: Secondary | ICD-10-CM | POA: Insufficient documentation

## 2019-02-10 DIAGNOSIS — Z8249 Family history of ischemic heart disease and other diseases of the circulatory system: Secondary | ICD-10-CM | POA: Insufficient documentation

## 2019-02-10 DIAGNOSIS — I428 Other cardiomyopathies: Secondary | ICD-10-CM | POA: Insufficient documentation

## 2019-02-10 DIAGNOSIS — E1165 Type 2 diabetes mellitus with hyperglycemia: Secondary | ICD-10-CM | POA: Insufficient documentation

## 2019-02-10 DIAGNOSIS — I5023 Acute on chronic systolic (congestive) heart failure: Secondary | ICD-10-CM | POA: Insufficient documentation

## 2019-02-10 DIAGNOSIS — I110 Hypertensive heart disease with heart failure: Secondary | ICD-10-CM | POA: Insufficient documentation

## 2019-02-10 LAB — BRAIN NATRIURETIC PEPTIDE: B Natriuretic Peptide: 58.7 pg/mL (ref 0.0–100.0)

## 2019-02-10 LAB — BASIC METABOLIC PANEL
Anion gap: 12 (ref 5–15)
BUN: 11 mg/dL (ref 6–20)
CO2: 24 mmol/L (ref 22–32)
Calcium: 9.7 mg/dL (ref 8.9–10.3)
Chloride: 100 mmol/L (ref 98–111)
Creatinine, Ser: 1.13 mg/dL (ref 0.61–1.24)
GFR calc Af Amer: >60 mL/min (ref >60)
GFR calc non Af Amer: >60 mL/min (ref >60)
Glucose, Bld: 236 mg/dL — ABNORMAL HIGH (ref 70–99)
Potassium: 4.4 mmol/L (ref 3.5–5.1)
Sodium: 136 mmol/L (ref 135–145)

## 2019-02-10 MED ORDER — MAGNESIUM OXIDE -MG SUPPLEMENT 200 MG PO TABS: 200.0000 mg | ORAL_TABLET | Freq: Every day | ORAL | 3 refills | Status: DC

## 2019-02-10 NOTE — Patient Instructions (Signed)
Lab work done today. We will notify you of any abnormal lab work. No news is good news.  EKG done today.  START Magnesium Oxide 200mg  daily.  Please follow up with the [facility] in 6 weeks.  At the [facility], you and your health needs are our priority. As part of our continuing mission to provide you with exceptional heart care, we have created designated Provider Care Teams. These Care Teams include your primary Cardiologist (physician) and Advanced Practice Providers (APPs- Physician Assistants and Nurse Practitioners) who all work together to provide you with the care you need, when you need it.   You may see any of the following providers on your designated Care Team at your next follow up: Marland Kitchen Dr Glori Bickers . Dr Loralie Champagne . Darrick Grinder, NP   Please be sure to bring in all your medications bottles to every appointment.

## 2019-02-28 DIAGNOSIS — I420 Dilated cardiomyopathy: Secondary | ICD-10-CM | POA: Diagnosis not present

## 2019-03-17 ENCOUNTER — Encounter: Payer: Self-pay | Admitting: Nurse Practitioner

## 2019-03-17 ENCOUNTER — Other Ambulatory Visit: Payer: Self-pay

## 2019-03-17 ENCOUNTER — Ambulatory Visit: Payer: Self-pay | Attending: Nurse Practitioner | Admitting: Nurse Practitioner

## 2019-03-17 VITALS — BP 153/91 | HR 53 | Temp 99.0°F | Ht 75.5 in | Wt 247.0 lb

## 2019-03-17 DIAGNOSIS — I10 Essential (primary) hypertension: Secondary | ICD-10-CM

## 2019-03-17 DIAGNOSIS — E785 Hyperlipidemia, unspecified: Secondary | ICD-10-CM

## 2019-03-17 DIAGNOSIS — E11630 Type 2 diabetes mellitus with periodontal disease: Secondary | ICD-10-CM

## 2019-03-17 LAB — GLUCOSE, POCT (MANUAL RESULT ENTRY): POC Glucose: 336 mg/dl — AB (ref 70–99)

## 2019-03-17 LAB — POCT GLYCOSYLATED HEMOGLOBIN (HGB A1C): Hemoglobin A1C: 10 % — AB (ref 4.0–5.6)

## 2019-03-17 MED ORDER — METFORMIN HCL 500 MG PO TABS: 1000.0000 mg | ORAL_TABLET | Freq: Two times a day (BID) | ORAL | 2 refills | Status: DC

## 2019-03-17 MED ORDER — GLIPIZIDE 10 MG PO TABS: 10.0000 mg | ORAL_TABLET | Freq: Two times a day (BID) | ORAL | 2 refills | Status: DC

## 2019-03-17 NOTE — Progress Notes (Signed)
Assessment & Plan:  Kevin Hampton was seen today for establish care.  Diagnoses and all orders for this visit:  Type 2 diabetes mellitus with periodontal disease, without long-term current use of insulin (HCC) -     Glucose (CBG) -     HgB A1c -     metFORMIN (GLUCOPHAGE) 500 MG tablet; Take 2 tablets (1,000 mg total) by mouth 2 (two) times daily with a meal. -     glipiZIDE (GLUCOTROL) 10 MG tablet; Take 1 tablet (10 mg total) by mouth 2 (two) times daily before a meal. -     Ambulatory referral to Ophthalmology Continue blood sugar control as discussed in office today, low carbohydrate diet, and regular physical exercise as tolerated, 150 minutes per week (30 min each day, 5 days per week, or 50 min 3 days per week). Keep blood sugar logs with fasting goal of 90-130 mg/dl, post prandial (after you eat) less than 180.  For Hypoglycemia: BS <60 and Hyperglycemia BS >400; contact the clinic ASAP. Annual eye exams and foot exams are recommended.  Essential hypertension Continue all antihypertensives as prescribed.  Remember to bring in your blood pressure log with you for your follow up appointment.  DASH/Mediterranean Diets are healthier choices for HTN.    Dyslipidemia, goal LDL below 70 -     atorvastatin (LIPITOR) 40 MG tablet; Take 1 tablet (40 mg total) by mouth daily. INSTRUCTIONS: Work on a low fat, heart healthy diet and participate in regular aerobic exercise program by working out at least 150 minutes per week; 5 days a week-30 minutes per day. Avoid red meat, fried foods. junk foods, sodas, sugary drinks, unhealthy snacking, alcohol and smoking.  Drink at least 48oz of water per day and monitor your carbohydrate intake daily.     Patient has been counseled on age-appropriate routine health concerns for screening and prevention. These are reviewed and up-to-date. Referrals have been placed accordingly. Immunizations are up-to-date or declined.    Subjective:   Chief Complaint   Patient presents with  . Establish Care    Pt. is here to establish care for diabetes.   HPI Kevin Hampton 53 y.o. male presents to office today to establish care.   has a past medical history of Essential hypertension, Hypertensive emergency (12/15/2018), Obesity, Systolic heart failure ([place]), and Type 2 diabetes mellitus with complication ([place]). Severe systolic dysfunction with EF 25-30%. Seeing Cardiology. Last visit 02-10-2019. LifeVest placed 12-27-2018. Taken off carvedilol due to fatigue and started on amiodarone.    DM TYPE 2 Not well controlled however decreased from 12.7 to 10.0. Very reluctant to start an injectables including insulin. He has not been taking his jardiance 10 mg daily, glipizide 10 mg BID and metformin 1000 mg BID. He is requesting to hold off at this time on insulin and allow him a few weeks "to get back on track" to take all his medications, make better dietary choices and then return for meter check. He is aware if blood glucose levels are still too high he will need to start an injectable or insulin. He is overdue for eye exam. He has not been monitoring his blood glucose levels at home. Taking ACE/ARB and statin.  Lab Results  Component Value Date   HGBA1C 10.0 (A) 03/17/2019   Essential Hypertension Taking amiodarone 200 mg BID, Entresto 49-51 mg BID and spironolactone 12.5mg . Does not monitor his blood pressure at home. Denies chest pain, shortness of breath, palpitations, lightheadedness, dizziness, headaches or BLE  edema. States blood pressures are lower at home.  BP Readings from Last 3 Encounters:  03/17/19 (!) 153/91  02/10/19 130/80  02/03/19 140/80    Mixed Hyperlipidemia LDL not at goal <70. Will start atorvastatin 40 mg daily. Denies any statin intolerance.  Lab Results  Component Value Date   LDLCALC 81 12/15/2018    Review of Systems  Constitutional: Negative for fever, malaise/fatigue and weight loss.  HENT: Negative.  Negative for  nosebleeds.   Eyes: Negative.  Negative for blurred vision, double vision and photophobia.  Respiratory: Negative.  Negative for cough and shortness of breath.   Cardiovascular: Negative.  Negative for chest pain, palpitations and leg swelling.  Gastrointestinal: Negative.  Negative for heartburn, nausea and vomiting.  Musculoskeletal: Negative.  Negative for myalgias.  Neurological: Negative.  Negative for dizziness, focal weakness, seizures and headaches.  Psychiatric/Behavioral: Negative.  Negative for suicidal ideas.    Past Medical History:  Diagnosis Date  . Essential hypertension   . Hypertensive emergency 12/15/2018  . Obesity   . Systolic heart failure ([place])   . Type 2 diabetes mellitus with complication [facility])     Past Surgical History:  Procedure Laterality Date  . RIGHT/LEFT HEART CATH AND CORONARY ANGIOGRAPHY N/A 12/16/2018   Procedure: RIGHT/LEFT HEART CATH AND CORONARY ANGIOGRAPHY;  Surgeon: Lorretta Harp, MD;  Location: [facility];  Service: Cardiovascular;  Laterality: N/A;    Family History  Problem Relation Age of Onset  . Hypertension Mother   . Heart attack Mother     Social History Reviewed with no changes to be made today.   Outpatient Medications Prior to Visit  Medication Sig Dispense Refill  . amiodarone (PACERONE) 200 MG tablet Take 1 tablet (200 mg total) by mouth 2 (two) times daily. 90 tablet 3  . empagliflozin (JARDIANCE) 10 MG TABS tablet Take 10 mg by mouth daily. 30 tablet 3  . Magnesium Oxide 200 MG TABS Take 1 tablet (200 mg total) by mouth daily. 90 tablet 3  . sacubitril-valsartan (ENTRESTO) 49-51 MG Take 1 tablet by mouth 2 (two) times daily. 60 tablet 6  . spironolactone (ALDACTONE) 25 MG tablet Take 0.5 tablets (12.5 mg total) by mouth daily. 15 tablet 6  . glipiZIDE (GLUCOTROL) 10 MG tablet Take 1 tablet (10 mg total) by mouth 2 (two) times a day. 60 tablet 2  . metFORMIN (GLUCOPHAGE) 500 MG tablet Take 2 tablets (1,000 mg  total) by mouth 2 (two) times daily with a meal. 120 tablet 1   No facility-administered medications prior to visit.     Allergies  Allergen Reactions  . Coreg [Carvedilol] Other (See Comments)    Brings heart rate and blood pressure down severly       Objective:    BP (!) 153/91 (BP Location: Right Arm, Patient Position: Sitting, Cuff Size: Normal)   Pulse (!) 53   Temp 99 F (37.2 C) (Oral)   Ht 6' 3.5" (1.918 m)   Wt 247 lb (112 kg)   SpO2 99%   BMI 30.47 kg/m  Wt Readings from Last 3 Encounters:  03/17/19 247 lb (112 kg)  02/10/19 240 lb (108.9 kg)  02/03/19 244 lb 12.8 oz (111 kg)    Physical Exam Vitals signs and nursing note reviewed.  Constitutional:      Appearance: He is well-developed.  HENT:     Head: Normocephalic and atraumatic.  Neck:     Musculoskeletal: Normal range of motion.  Cardiovascular:  Rate and Rhythm: Normal rate. Rhythm irregular.     Heart sounds: Normal heart sounds. No murmur. No friction rub. No gallop.   Pulmonary:     Effort: Pulmonary effort is normal. No tachypnea or respiratory distress.     Breath sounds: Normal breath sounds. No decreased breath sounds, wheezing, rhonchi or rales.  Chest:     Chest wall: No tenderness.  Abdominal:     General: Bowel sounds are normal.     Palpations: Abdomen is soft.  Musculoskeletal: Normal range of motion.  Skin:    General: Skin is warm and dry.  Neurological:     Mental Status: He is alert and oriented to person, place, and time.     Coordination: Coordination normal.  Psychiatric:        Behavior: Behavior normal. Behavior is cooperative.        Thought Content: Thought content normal.        Judgment: Judgment normal.          Patient has been counseled extensively about nutrition and exercise as well as the importance of adherence with medications and regular follow-up. The patient was given clear instructions to go to ER or return to medical center if symptoms don't  improve, worsen or new problems develop. The patient verbalized understanding.   Follow-up: Return in about 6 weeks (around 04/28/2019) for [place]. ALSO FASTING LIPID PANEL THAT DAY FOR LABS.   Gildardo Pounds, FNP-BC [facility] and  [place], [place]   03/19/2019, 10:20 PM

## 2019-03-19 ENCOUNTER — Encounter: Payer: Self-pay | Admitting: Nurse Practitioner

## 2019-03-19 MED ORDER — ATORVASTATIN CALCIUM 40 MG PO TABS: 40.0000 mg | ORAL_TABLET | Freq: Every day | ORAL | 3 refills | Status: DC

## 2019-03-25 ENCOUNTER — Telehealth (HOSPITAL_COMMUNITY): Payer: Self-pay

## 2019-03-25 NOTE — Telephone Encounter (Signed)
Received a request for medical records from [facility] to [phone] on 03/24/2019.

## 2019-03-30 DIAGNOSIS — I420 Dilated cardiomyopathy: Secondary | ICD-10-CM | POA: Diagnosis not present

## 2019-04-03 ENCOUNTER — Other Ambulatory Visit: Payer: Self-pay

## 2019-04-03 ENCOUNTER — Encounter (HOSPITAL_COMMUNITY): Payer: Self-pay | Admitting: Internal Medicine

## 2019-04-03 ENCOUNTER — Ambulatory Visit (HOSPITAL_COMMUNITY)
Admission: RE | Admit: 2019-04-03 | Discharge: 2019-04-03 | Disposition: A | Discharge status: Home / Self Care | Payer: Self-pay | Source: Ambulatory Visit | Attending: Internal Medicine | Admitting: Internal Medicine

## 2019-04-03 VITALS — BP 140/84 | HR 74 | Wt 243.2 lb

## 2019-04-03 DIAGNOSIS — I5022 Chronic systolic (congestive) heart failure: Secondary | ICD-10-CM

## 2019-04-03 DIAGNOSIS — Z79899 Other long term (current) drug therapy: Secondary | ICD-10-CM | POA: Insufficient documentation

## 2019-04-03 DIAGNOSIS — Z7984 Long term (current) use of oral hypoglycemic drugs: Secondary | ICD-10-CM | POA: Insufficient documentation

## 2019-04-03 DIAGNOSIS — E669 Obesity, unspecified: Secondary | ICD-10-CM | POA: Insufficient documentation

## 2019-04-03 DIAGNOSIS — Z6830 Body mass index (BMI) 30.0-30.9, adult: Secondary | ICD-10-CM | POA: Insufficient documentation

## 2019-04-03 DIAGNOSIS — I428 Other cardiomyopathies: Secondary | ICD-10-CM | POA: Insufficient documentation

## 2019-04-03 DIAGNOSIS — Z87891 Personal history of nicotine dependence: Secondary | ICD-10-CM | POA: Insufficient documentation

## 2019-04-03 DIAGNOSIS — I447 Left bundle-branch block, unspecified: Secondary | ICD-10-CM | POA: Insufficient documentation

## 2019-04-03 DIAGNOSIS — I110 Hypertensive heart disease with heart failure: Secondary | ICD-10-CM | POA: Insufficient documentation

## 2019-04-03 DIAGNOSIS — I493 Ventricular premature depolarization: Secondary | ICD-10-CM | POA: Insufficient documentation

## 2019-04-03 DIAGNOSIS — Z8249 Family history of ischemic heart disease and other diseases of the circulatory system: Secondary | ICD-10-CM | POA: Insufficient documentation

## 2019-04-03 DIAGNOSIS — I5021 Acute systolic (congestive) heart failure: Secondary | ICD-10-CM | POA: Insufficient documentation

## 2019-04-03 DIAGNOSIS — E1165 Type 2 diabetes mellitus with hyperglycemia: Secondary | ICD-10-CM | POA: Insufficient documentation

## 2019-04-03 LAB — BASIC METABOLIC PANEL
Anion gap: 11 (ref 5–15)
BUN: 13 mg/dL (ref 6–20)
CO2: 26 mmol/L (ref 22–32)
Calcium: 9.4 mg/dL (ref 8.9–10.3)
Chloride: 103 mmol/L (ref 98–111)
Creatinine, Ser: 1.26 mg/dL — ABNORMAL HIGH (ref 0.61–1.24)
GFR calc Af Amer: >60 mL/min (ref >60)
GFR calc non Af Amer: >60 mL/min (ref >60)
Glucose, Bld: 193 mg/dL — ABNORMAL HIGH (ref 70–99)
Potassium: 4 mmol/L (ref 3.5–5.1)
Sodium: 140 mmol/L (ref 135–145)

## 2019-04-03 LAB — BRAIN NATRIURETIC PEPTIDE: B Natriuretic Peptide: 31.4 pg/mL (ref 0.0–100.0)

## 2019-04-03 MED ORDER — ENTRESTO 97-103 MG PO TABS: 1.0000 | ORAL_TABLET | Freq: Two times a day (BID) | ORAL | 11 refills | Status: DC

## 2019-04-03 NOTE — Progress Notes (Signed)
ADVANCED HF CLINIC NOTE  HPI:  [name] is a 53 year old male with a history of HTN and poorly controlled diabetes who was admitted to [facility] from 7/5-7/20 with hypertensive urgency and acute systolic HF. ECG showed LBBB of uncertain duration.  He was admitted to the ICU and placed on IV NTG and underwent diuresis. Echocardiogram showed severe systolic dysfunction with EF 25-30%. Cardiac catheterization on 12/16/18 showed normal coronary arteries with well compensated filling pressures after diuresis.   RA = 4 RV = 34/3 PA = 31/8 (19) PCWP =  11 Fick = 5.1/2.24  He was started on GDMT including carvedilol 6.25 bid, Entresto 49/51 bid, spironolactone 12.5 daily. He did well for several days but then developed severe fatigue. I saw him in Clinic on 12/27/18. He was in bigeminy. I stopped his b-blocker and started amio 200 bid.K 5.0 Mg 1.7.  LifeVest placed. cMRI ordered (not done yet).  He returns for f/u with his wife. Says he feels great. 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 LifeVest bugging him so not wearing at night. Taking all meds as prescribed. No edema, CP or SOB. BP 120/70 at home. Rare snoring after losing 100 pounds     SH:  Social History   Socioeconomic History  . Marital status: Married    Spouse name: Not on file  . Number of children: Not on file  . Years of education: Not on file  . Highest education level: Not on file  Occupational History  . Not on file  Social Needs  . Financial resource strain: Not on file  . Food insecurity    Worry: Not on file    Inability: Not on file  . Transportation needs    Medical: Not on file    Non-medical: Not on file  Tobacco Use  . Smoking status: Former Research scientist (life sciences)  . Smokeless tobacco: Never Used  Substance and Sexual Activity  . Alcohol use: Never    Frequency: Never  . Drug use: Never  . Sexual activity: Yes  Lifestyle  . Physical activity    Days per week: Not on file    Minutes per  session: Not on file  . Stress: Not on file  Relationships  . Social Herbalist on phone: Not on file    Gets together: Not on file    Attends religious service: Not on file    Active member of club or organization: Not on file    Attends meetings of clubs or organizations: Not on file    Relationship status: Not on file  . Intimate partner violence    Fear of current or ex partner: Not on file    Emotionally abused: Not on file    Physically abused: Not on file    Forced sexual activity: Not on file  Other Topics Concern  . Not on file  Social History Narrative  . Not on file    FH:  Family History  Problem Relation Age of Onset  . Hypertension Mother   . Heart attack Mother     Past Medical History:  Diagnosis Date  . Essential hypertension   . Hypertensive emergency 12/15/2018  . Obesity   . Systolic heart failure ([place])   . Type 2 diabetes mellitus with complication [facility])     Current Outpatient Medications  Medication Sig Dispense Refill  . amiodarone (PACERONE) 200 MG tablet Take 1 tablet (  200 mg total) by mouth 2 (two) times daily. 90 tablet 3  . empagliflozin (JARDIANCE) 10 MG TABS tablet Take 10 mg by mouth daily. 30 tablet 3  . glipiZIDE (GLUCOTROL) 10 MG tablet Take 1 tablet (10 mg total) by mouth 2 (two) times daily before a meal. 60 tablet 2  . magnesium oxide (MAG-OX) 400 (241.3 Mg) MG tablet Take 200 mg by mouth daily.     . metFORMIN (GLUCOPHAGE) 500 MG tablet Take 2 tablets (1,000 mg total) by mouth 2 (two) times daily with a meal. 120 tablet 2  . sacubitril-valsartan (ENTRESTO) 49-51 MG Take 1 tablet by mouth 2 (two) times daily. 60 tablet 6  . spironolactone (ALDACTONE) 25 MG tablet Take 0.5 tablets (12.5 mg total) by mouth daily. 15 tablet 6   No current facility-administered medications for this encounter.     Vitals:   04/03/19 0959  BP: 140/84  Pulse: 74  SpO2: 98%  Weight: 110.3 kg (243 lb 3.2 oz)   PHYSICAL EXAM: General:  Well  appearing. No resp difficulty HEENT: normal Neck: supple. no JVD. Carotids 2+ bilat; no bruits. No lymphadenopathy or thryomegaly appreciated. Cor: PMI nondisplaced. Regular rate & rhythm. No rubs, gallops or murmurs. Lungs: clear Abdomen: soft, nontender, nondistended. No hepatosplenomegaly. No bruits or masses. Good bowel sounds. Extremities: no cyanosis, clubbing, rash, edema Neuro: alert & orientedx3, cranial nerves grossly intact. moves all 4 extremities w/o difficulty. Affect pleasan  ECG: NSR 71 LBBB 116ms frequent PVCs. Personally reviewed  ASSESSMENT & PLAN:  1. Acute systolic HF     - diagnosed 7/20 EF ~25-30%. NICM - cath 7/20 with normal coronaries and compensated hemodynamics - suspect he likely has PVC-induced CM but may also be related to LBBB or poorly-controlled HTN - Feels much better. NYHA I  - Volume status ok  - Off carvedilol for now and refuses to restart as he is worried it will drop his BP again  - continue spiro 12.5 - increase Entresto to 97/103  - Continue Jardiance - consider cMRI to evaluate for scar or infiltrative process - Echo today done at bedside on 8/20 EF 25-30% with frequent PVCs and dyssynchrony. - ECG with persistent PVCs. Place zio to assess burden - He wants to remove LifeVest. We discussed small risk of SCD and he and his wife are aware - Repeat echo next month, if  EF does not improve with suppression of PVCs likely need CRT-D with LBBB  2. Frequent PVCs - suspect may be related to undiagnosed OSA - PVCs still present but reduced from previous.      - continue amio 200 bid - home sleep study with AHI 3. (No significant OSA) - cMRI to exclude infiltrative disease - place zio to quantify  3. HTN - BP high here but well controlled on home cuff  4. LBBB - discussion as above  5. Poorly controlled DM2 - Per his PCP. HgBA1c was 12.7 in 7/20 - Continue Farxiga - Hgba1c improving now down to 10   Glori Bickers, MD  10:38 AM

## 2019-04-03 NOTE — Patient Instructions (Signed)
INCREASE Entresto to 97/103 mg, one tab twice daily  Labs today We will only contact you if something comes back abnormal or we need to make some changes. Otherwise no news is good news!  Your physician has requested that you have an echocardiogram. Echocardiography is a painless test that uses sound waves to create images of your heart. It provides your doctor with information about the size and shape of your heart and how well your heart's chambers and valves are working. This procedure takes approximately one hour. There are no restrictions for this procedure.  Your physician recommends that you schedule a follow-up appointment in: 4-6 weeks with Dr Haroldine Laws and echo  Do the following things EVERYDAY: 1) Weigh yourself in the morning before breakfast. Write it down and keep it in a log. 2) Take your medicines as prescribed 3) Eat low salt foods-Limit salt (sodium) to 2000 mg per day.  4) Stay as active as you can everyday 5) Limit all fluids for the day to less than 2 liters  At the [facility], you and your health needs are our priority. As part of our continuing mission to provide you with exceptional heart care, we have created designated Provider Care Teams. These Care Teams include your primary Cardiologist (physician) and Advanced Practice Providers (APPs- Physician Assistants and Nurse Practitioners) who all work together to provide you with the care you need, when you need it.   You may see any of the following providers on your designated Care Team at your next follow up: Marland Kitchen Dr Glori Bickers . Dr Loralie Champagne . Darrick Grinder, NP . Lyda Jester, PA   Please be sure to bring in all your medications bottles to every appointment.

## 2019-04-10 ENCOUNTER — Telehealth (HOSPITAL_COMMUNITY): Payer: Self-pay | Admitting: *Deleted

## 2019-04-10 NOTE — Telephone Encounter (Signed)
Fine by me. Please let her know Kevin Hampton will not drop HR but can drop BP.

## 2019-04-10 NOTE — Telephone Encounter (Signed)
Patients wife called stating pts bp has been very low (too low to read on a bp monitor) and his heart rate dropped to the 30s after increasing entresto. She would like patient to decrease to the 49/51mg  dose.   Routed to [place]

## 2019-04-11 ENCOUNTER — Telehealth (HOSPITAL_COMMUNITY): Payer: Self-pay

## 2019-04-11 NOTE — Telephone Encounter (Signed)
Received a Request for Medical Records from [facility] to [phone] on 04/11/2019. 2nd Time Faxing

## 2019-04-11 NOTE — Telephone Encounter (Signed)
Called pt to advise. No answer left VM  

## 2019-04-16 NOTE — Telephone Encounter (Signed)
Left another voice message for pt to return my call.

## 2019-05-01 ENCOUNTER — Ambulatory Visit (HOSPITAL_BASED_OUTPATIENT_CLINIC_OR_DEPARTMENT_OTHER)
Admission: RE | Admit: 2019-05-01 | Discharge: 2019-05-01 | Disposition: A | Discharge status: Home / Self Care | Payer: BC Managed Care – PPO | Source: Ambulatory Visit | Attending: Internal Medicine | Admitting: Internal Medicine

## 2019-05-01 ENCOUNTER — Ambulatory Visit (HOSPITAL_COMMUNITY)
Admission: RE | Admit: 2019-05-01 | Discharge: 2019-05-01 | Disposition: A | Discharge status: Home / Self Care | Payer: BC Managed Care – PPO | Source: Ambulatory Visit | Attending: Internal Medicine | Admitting: Internal Medicine

## 2019-05-01 ENCOUNTER — Other Ambulatory Visit: Payer: Self-pay

## 2019-05-01 VITALS — BP 126/67 | HR 75 | Wt 245.4 lb

## 2019-05-01 DIAGNOSIS — I5022 Chronic systolic (congestive) heart failure: Secondary | ICD-10-CM

## 2019-05-01 DIAGNOSIS — I493 Ventricular premature depolarization: Secondary | ICD-10-CM

## 2019-05-01 DIAGNOSIS — I447 Left bundle-branch block, unspecified: Secondary | ICD-10-CM

## 2019-05-01 DIAGNOSIS — I5023 Acute on chronic systolic (congestive) heart failure: Secondary | ICD-10-CM | POA: Insufficient documentation

## 2019-05-01 DIAGNOSIS — I083 Combined rheumatic disorders of mitral, aortic and tricuspid valves: Secondary | ICD-10-CM | POA: Insufficient documentation

## 2019-05-01 DIAGNOSIS — I110 Hypertensive heart disease with heart failure: Secondary | ICD-10-CM | POA: Insufficient documentation

## 2019-05-01 DIAGNOSIS — Z87891 Personal history of nicotine dependence: Secondary | ICD-10-CM | POA: Diagnosis not present

## 2019-05-01 DIAGNOSIS — Z79899 Other long term (current) drug therapy: Secondary | ICD-10-CM | POA: Diagnosis not present

## 2019-05-01 DIAGNOSIS — Z7984 Long term (current) use of oral hypoglycemic drugs: Secondary | ICD-10-CM | POA: Diagnosis not present

## 2019-05-01 DIAGNOSIS — Z8249 Family history of ischemic heart disease and other diseases of the circulatory system: Secondary | ICD-10-CM | POA: Insufficient documentation

## 2019-05-01 DIAGNOSIS — E118 Type 2 diabetes mellitus with unspecified complications: Secondary | ICD-10-CM | POA: Insufficient documentation

## 2019-05-01 LAB — COMPREHENSIVE METABOLIC PANEL
ALT: 22 U/L (ref 0–44)
AST: 20 U/L (ref 15–41)
Albumin: 3.8 g/dL (ref 3.5–5.0)
Alkaline Phosphatase: 61 U/L (ref 38–126)
Anion gap: 11 (ref 5–15)
BUN: 12 mg/dL (ref 6–20)
CO2: 26 mmol/L (ref 22–32)
Calcium: 9.8 mg/dL (ref 8.9–10.3)
Chloride: 102 mmol/L (ref 98–111)
Creatinine, Ser: 1.15 mg/dL (ref 0.61–1.24)
GFR calc Af Amer: >60 mL/min (ref >60)
GFR calc non Af Amer: >60 mL/min (ref >60)
Glucose, Bld: 187 mg/dL — ABNORMAL HIGH (ref 70–99)
Potassium: 4.6 mmol/L (ref 3.5–5.1)
Sodium: 139 mmol/L (ref 135–145)
Total Bilirubin: 0.6 mg/dL (ref 0.3–1.2)
Total Protein: 6.8 g/dL (ref 6.5–8.1)

## 2019-05-01 LAB — TSH: TSH: 0.845 u[IU]/mL (ref 0.350–4.500)

## 2019-05-01 LAB — T4, FREE: Free T4: 1.6 ng/dL — ABNORMAL HIGH (ref 0.61–1.12)

## 2019-05-01 LAB — MAGNESIUM: Magnesium: 1.9 mg/dL (ref 1.7–2.4)

## 2019-05-01 MED ORDER — AMIODARONE HCL 200 MG PO TABS: 200.0000 mg | ORAL_TABLET | Freq: Every day | ORAL | 3 refills | Status: DC

## 2019-05-01 NOTE — Progress Notes (Signed)
ADVANCED HF CLINIC NOTE  HPI:  [name] is a 53 year old male with a history of HTN and poorly controlled diabetes who was admitted to [facility] from 7/5-7/20 with hypertensive urgency and acute systolic HF. ECG showed LBBB of uncertain duration.  He was admitted to the ICU and placed on IV NTG and underwent diuresis. Echocardiogram showed severe systolic dysfunction with EF 25-30%. Cardiac catheterization on 12/16/18 showed normal coronary arteries with well compensated filling pressures after diuresis.   RA = 4 RV = 34/3 PA = 31/8 (19) PCWP =  11 Fick = 5.1/2.24  He was started on GDMT including carvedilol 6.25 bid, Entresto 49/51 bid, spironolactone 12.5 daily. He did well for several days but then developed severe fatigue. I saw him in Clinic on 12/27/18. He was in bigeminy. I stopped his b-blocker and started amio 200 bid.K 5.0 Mg 1.7.  LifeVest placed. cMRI ordered (not done yet).  He returns for f/u with his wife. Says he feels great. 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 Says he hasn't fel this good in 5 years. Entresto cut back down to 49/51 due to low BP and inability to get a reading on the cuff. No edema, orthopnea or PND. BP at home typically in 120s.   Echo today EF ~35049% with severe dyssynchrony due to LBBB and PVCs Personally reviewed   Zio patch 10/20: 4.8 PVCs  SH:  Social History   Socioeconomic History  . Marital status: Married    Spouse name: Not on file  . Number of children: Not on file  . Years of education: Not on file  . Highest education level: Not on file  Occupational History  . Not on file  Social Needs  . Financial resource strain: Not on file  . Food insecurity    Worry: Not on file    Inability: Not on file  . Transportation needs    Medical: Not on file    Non-medical: Not on file  Tobacco Use  . Smoking status: Former Research scientist (life sciences)  . Smokeless tobacco: Never Used  Substance and Sexual Activity  . Alcohol use:  Never    Frequency: Never  . Drug use: Never  . Sexual activity: Yes  Lifestyle  . Physical activity    Days per week: Not on file    Minutes per session: Not on file  . Stress: Not on file  Relationships  . Social Herbalist on phone: Not on file    Gets together: Not on file    Attends religious service: Not on file    Active member of club or organization: Not on file    Attends meetings of clubs or organizations: Not on file    Relationship status: Not on file  . Intimate partner violence    Fear of current or ex partner: Not on file    Emotionally abused: Not on file    Physically abused: Not on file    Forced sexual activity: Not on file  Other Topics Concern  . Not on file  Social History Narrative  . Not on file    FH:  Family History  Problem Relation Age of Onset  . Hypertension Mother   . Heart attack Mother     Past Medical History:  Diagnosis Date  . Essential hypertension   . Hypertensive emergency 12/15/2018  . Obesity   . Systolic heart failure ([place])   .  Type 2 diabetes mellitus with complication [facility])     Current Outpatient Medications  Medication Sig Dispense Refill  . amiodarone (PACERONE) 200 MG tablet Take 1 tablet (200 mg total) by mouth 2 (two) times daily. 90 tablet 3  . Ascorbic Acid (VITAMIN C) 1000 MG tablet Take 1,000 mg by mouth daily as needed.    . empagliflozin (JARDIANCE) 10 MG TABS tablet Take 10 mg by mouth daily. 30 tablet 3  . glipiZIDE (GLUCOTROL) 10 MG tablet Take 1 tablet (10 mg total) by mouth 2 (two) times daily before a meal. 60 tablet 2  . Loratadine (CLARITIN) 10 MG CAPS Take 10 mg by mouth daily as needed (allergies).    . magnesium oxide (MAG-OX) 400 (241.3 Mg) MG tablet Take 200 mg by mouth daily.     . metFORMIN (GLUCOPHAGE) 500 MG tablet Take 2 tablets (1,000 mg total) by mouth 2 (two) times daily with a meal. 120 tablet 2  . sacubitril-valsartan (ENTRESTO) 97-103 MG Take 1/2 tablet by mouth two times daily     . spironolactone (ALDACTONE) 25 MG tablet Take 0.5 tablets (12.5 mg total) by mouth daily. 15 tablet 6   No current facility-administered medications for this encounter.     Vitals:   05/01/19 1018  BP: 126/67  Pulse: 75  SpO2: 99%  Weight: 111.3 kg (245 lb 6.4 oz)   PHYSICAL EXAM: General:  Well appearing. No resp difficulty HEENT: normal Neck: supple. no JVD. Carotids 2+ bilat; no bruits. No lymphadenopathy or thryomegaly appreciated. Cor: PMI nondisplaced. Irregular  No rubs, gallops or murmurs. Lungs: clear Abdomen: soft, nontender, nondistended. No hepatosplenomegaly. No bruits or masses. Good bowel sounds. Extremities: no cyanosis, clubbing, rash, edema Neuro: alert & orientedx3, cranial nerves grossly intact. moves all 4 extremities w/o difficulty. Affect pleasan  ECG: NSR 71 LBBB 133ms frequent PVCs. Personally reviewed  ASSESSMENT & PLAN:  1. Acute systolic HF     - diagnosed 7/20 EF ~25-30%. NICM - cath 7/20 with normal coronaries and compensated hemodynamics - suspect he likely has LBBB +/- PVC-induced CM  - Feels much better. NYHA I  - Volume status looks good - Echo today EF ~35-40% with severe LV dyssynchrony  - Zio patch with 4.8% PVC - Off carvedilol for now and refuses to restart b-blockersas he is worried it will drop his BP again  - continue spiro 12.5 - continue Entresto49/51 (falied titration to 97/103) - Continue Jardiance - consider cMRI to evaluate for scar or infiltrative process - Suggested referral to EP to discuss CRT to fix LBBB. He says he feels great and wants to hold off. Will see back in 3-4 months with repeat echo and re-discuss  2. Frequent PVCs - suspect may be related to undiagnosed OSA - PVCs still present but reduced from previous.      - zio 10/20 4.8% PVC      - drop amio to 200 daily. Can consider switch to mexilitene as needed. He refuses to retry b-blockers - home sleep study with AHI 3. (No significant OSA) - cMRI to  exclude infiltrative disease - labs today  3. HTN - Blood pressure well controlled. Continue current regimen.  4. LBBB - discussion as above  5. Poorly controlled DM2 - Per his PCP. HgBA1c was 12.7 in 7/20 - Continue Jardiance - Hgba1c improving now down to 10   Glori Bickers, MD  10:23 AM

## 2019-05-01 NOTE — Patient Instructions (Signed)
DECREASE Amiodarone to 200mg  (1 tab) daily  EKG done today  Labs today We will only contact you if something comes back abnormal or we need to make some changes. Otherwise no news is good news!  Your physician has requested that you have an echocardiogram. Echocardiography is a painless test that uses sound waves to create images of your heart. It provides your doctor with information about the size and shape of your heart and how well your heart's chambers and valves are working. This procedure takes approximately one hour. There are no restrictions for this procedure.  Your physician recommends that you schedule a follow-up appointment in: 3-4 months for an echo and visit with Dr Haroldine Laws.  At the [facility], you and your health needs are our priority. As part of our continuing mission to provide you with exceptional heart care, we have created designated Provider Care Teams. These Care Teams include your primary Cardiologist (physician) and Advanced Practice Providers (APPs- Physician Assistants and Nurse Practitioners) who all work together to provide you with the care you need, when you need it.   You may see any of the following providers on your designated Care Team at your next follow up: Marland Kitchen Dr Glori Bickers . Dr Loralie Champagne . Darrick Grinder, NP . Lyda Jester, PA   Please be sure to bring in all your medications bottles to every appointment.

## 2019-05-01 NOTE — Progress Notes (Signed)
  Echocardiogram 2D Echocardiogram has been performed.  Jerrin Recore G Cru Kritikos 05/01/2019, 10:13 AM

## 2019-05-01 NOTE — Addendum Note (Signed)
Encounter addended by: Micki Riley, RN on: 05/01/2019 10:46 AM  Actions taken: Imaging Exam ended

## 2019-05-02 ENCOUNTER — Other Ambulatory Visit: Payer: Self-pay | Admitting: Critical Care Medicine

## 2019-05-02 LAB — T3, FREE: T3, Free: 3.2 pg/mL (ref 2.0–4.4)

## 2019-05-13 ENCOUNTER — Telehealth (HOSPITAL_COMMUNITY): Payer: Self-pay | Admitting: Cardiology

## 2019-05-13 NOTE — Telephone Encounter (Signed)
As requested most recent OV, zio report, echo results, and follow up appointment sent to [facility] via epic routing

## 2019-06-25 ENCOUNTER — Other Ambulatory Visit: Payer: Self-pay | Admitting: Nurse Practitioner

## 2019-06-25 DIAGNOSIS — E11630 Type 2 diabetes mellitus with periodontal disease: Secondary | ICD-10-CM

## 2019-08-02 ENCOUNTER — Other Ambulatory Visit: Payer: Self-pay | Admitting: Critical Care Medicine

## 2019-08-07 ENCOUNTER — Other Ambulatory Visit: Payer: Self-pay | Admitting: Nurse Practitioner

## 2019-08-07 DIAGNOSIS — E11630 Type 2 diabetes mellitus with periodontal disease: Secondary | ICD-10-CM

## 2019-08-28 ENCOUNTER — Other Ambulatory Visit (HOSPITAL_COMMUNITY): Payer: Self-pay | Admitting: *Deleted

## 2019-08-28 DIAGNOSIS — I5022 Chronic systolic (congestive) heart failure: Secondary | ICD-10-CM

## 2019-09-03 ENCOUNTER — Ambulatory Visit (HOSPITAL_BASED_OUTPATIENT_CLINIC_OR_DEPARTMENT_OTHER)
Admission: RE | Admit: 2019-09-03 | Discharge: 2019-09-03 | Disposition: A | Discharge status: Home / Self Care | Payer: BC Managed Care – PPO | Source: Ambulatory Visit | Attending: Internal Medicine | Admitting: Internal Medicine

## 2019-09-03 ENCOUNTER — Other Ambulatory Visit: Payer: Self-pay

## 2019-09-03 ENCOUNTER — Ambulatory Visit (HOSPITAL_COMMUNITY)
Admission: RE | Admit: 2019-09-03 | Discharge: 2019-09-03 | Disposition: A | Discharge status: Home / Self Care | Payer: BC Managed Care – PPO | Source: Ambulatory Visit | Attending: Internal Medicine | Admitting: Internal Medicine

## 2019-09-03 VITALS — BP 138/78 | HR 85 | Wt 243.1 lb

## 2019-09-03 DIAGNOSIS — I351 Nonrheumatic aortic (valve) insufficiency: Secondary | ICD-10-CM | POA: Insufficient documentation

## 2019-09-03 DIAGNOSIS — E1165 Type 2 diabetes mellitus with hyperglycemia: Secondary | ICD-10-CM | POA: Insufficient documentation

## 2019-09-03 DIAGNOSIS — I5021 Acute systolic (congestive) heart failure: Secondary | ICD-10-CM | POA: Insufficient documentation

## 2019-09-03 DIAGNOSIS — E669 Obesity, unspecified: Secondary | ICD-10-CM | POA: Diagnosis not present

## 2019-09-03 DIAGNOSIS — I493 Ventricular premature depolarization: Secondary | ICD-10-CM | POA: Diagnosis not present

## 2019-09-03 DIAGNOSIS — I447 Left bundle-branch block, unspecified: Secondary | ICD-10-CM | POA: Insufficient documentation

## 2019-09-03 DIAGNOSIS — Z7984 Long term (current) use of oral hypoglycemic drugs: Secondary | ICD-10-CM | POA: Diagnosis not present

## 2019-09-03 DIAGNOSIS — I428 Other cardiomyopathies: Secondary | ICD-10-CM | POA: Diagnosis not present

## 2019-09-03 DIAGNOSIS — Z79899 Other long term (current) drug therapy: Secondary | ICD-10-CM | POA: Diagnosis not present

## 2019-09-03 DIAGNOSIS — Z87891 Personal history of nicotine dependence: Secondary | ICD-10-CM | POA: Insufficient documentation

## 2019-09-03 DIAGNOSIS — I110 Hypertensive heart disease with heart failure: Secondary | ICD-10-CM | POA: Diagnosis not present

## 2019-09-03 DIAGNOSIS — I5022 Chronic systolic (congestive) heart failure: Secondary | ICD-10-CM

## 2019-09-03 DIAGNOSIS — Z8249 Family history of ischemic heart disease and other diseases of the circulatory system: Secondary | ICD-10-CM | POA: Diagnosis not present

## 2019-09-03 DIAGNOSIS — Z6829 Body mass index (BMI) 29.0-29.9, adult: Secondary | ICD-10-CM | POA: Insufficient documentation

## 2019-09-03 LAB — COMPREHENSIVE METABOLIC PANEL
ALT: 22 U/L (ref 0–44)
AST: 21 U/L (ref 15–41)
Albumin: 4 g/dL (ref 3.5–5.0)
Alkaline Phosphatase: 68 U/L (ref 38–126)
Anion gap: 13 (ref 5–15)
BUN: 14 mg/dL (ref 6–20)
CO2: 25 mmol/L (ref 22–32)
Calcium: 10.1 mg/dL (ref 8.9–10.3)
Chloride: 102 mmol/L (ref 98–111)
Creatinine, Ser: 1.2 mg/dL (ref 0.61–1.24)
GFR calc Af Amer: >60 mL/min (ref >60)
GFR calc non Af Amer: >60 mL/min (ref >60)
Glucose, Bld: 184 mg/dL — ABNORMAL HIGH (ref 70–99)
Potassium: 4.7 mmol/L (ref 3.5–5.1)
Sodium: 140 mmol/L (ref 135–145)
Total Bilirubin: 0.6 mg/dL (ref 0.3–1.2)
Total Protein: 6.8 g/dL (ref 6.5–8.1)

## 2019-09-03 LAB — BRAIN NATRIURETIC PEPTIDE: B Natriuretic Peptide: 36.4 pg/mL (ref 0.0–100.0)

## 2019-09-03 MED ORDER — ENTRESTO 49-51 MG PO TABS: 1.0000 | ORAL_TABLET | Freq: Two times a day (BID) | ORAL | 6 refills | Status: DC

## 2019-09-03 NOTE — Progress Notes (Signed)
ADVANCED HF CLINIC NOTE  HPI:  [name] is a 54 year old male with a history of HTN and poorly controlled diabetes who was admitted to [facility] from 7/5-7/20 with hypertensive urgency and acute systolic HF. ECG showed LBBB of uncertain duration.  He was admitted to the ICU and placed on IV NTG and underwent diuresis. Echocardiogram showed severe systolic dysfunction with EF 25-30%. Cardiac catheterization on 12/16/18 showed normal coronary arteries with well compensated filling pressures after diuresis.   RA = 4 RV = 34/3 PA = 31/8 (19) PCWP =  11 Fick = 5.1/2.24  He was started on GDMT including carvedilol 6.25 bid, Entresto 49/51 bid, spironolactone 12.5 daily. He did well for several days but then developed severe fatigue. I saw him in Clinic on 12/27/18. He was in bigeminy. I stopped his b-blocker and started amio 200 bid.K 5.0 Mg 1.7.  LifeVest placed. cMRI ordered (not done yet).  He returns for f/u with his wife. Says he feels great. 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 No SOB, edema, orthopnea or PND. Can walk as far as he wants. Goes up steps without problem. Taking Entresto 1/2 tablet once a day. Occasional dizziness. SBP at 110-115 range  Echo today 09/03/19: EF 25% RV ok. Personally reviewed  Echo 11/19 EF ~35-40% with severe dyssynchrony due to LBBB and PVCs Personally reviewed   Zio patch 10/20: 4.8 PVCs  SH:  Social History   Socioeconomic History  . Marital status: Married    Spouse name: Not on file  . Number of children: Not on file  . Years of education: Not on file  . Highest education level: Not on file  Occupational History  . Not on file  Tobacco Use  . Smoking status: Former Research scientist (life sciences)  . Smokeless tobacco: Never Used  Substance and Sexual Activity  . Alcohol use: Never  . Drug use: Never  . Sexual activity: Yes  Other Topics Concern  . Not on file  Social History Narrative  . Not on file   Social Determinants of Health   Financial  Resource Strain:   . Difficulty of Paying Living Expenses:   Food Insecurity:   . Worried About [name] in the Last Year:   . Arboriculturist in the Last Year:   Transportation Needs:   . Film/video editor (Medical):   Marland Kitchen Lack of Transportation (Non-Medical):   Physical Activity:   . Days of Exercise per Week:   . Minutes of Exercise per Session:   Stress:   . Feeling of Stress :   Social Connections:   . Frequency of Communication with Friends and Family:   . Frequency of Social Gatherings with Friends and Family:   . Attends Religious Services:   . Active Member of Clubs or Organizations:   . Attends Archivist Meetings:   Marland Kitchen Marital Status:   Intimate Partner Violence:   . Fear of Current or Ex-Partner:   . Emotionally Abused:   Marland Kitchen Physically Abused:   . Sexually Abused:     FH:  Family History  Problem Relation Age of Onset  . Hypertension Mother   . Heart attack Mother     Past Medical History:  Diagnosis Date  . Essential hypertension   . Hypertensive emergency 12/15/2018  . Obesity   . Systolic heart failure ([place])   . Type 2 diabetes mellitus with complication (H[facility])     Current  Outpatient Medications  Medication Sig Dispense Refill  . amiodarone (PACERONE) 200 MG tablet Take 1 tablet (200 mg total) by mouth daily. 90 tablet 3  . empagliflozin (JARDIANCE) 10 MG TABS tablet Take 10 mg by mouth daily. Must have office visit for refills 30 tablet 0  . glipiZIDE (GLUCOTROL) 10 MG tablet TAKE 1 TABLET BY MOUTH TWICE DAILY BEFORE A MEAL 60 tablet 0  . Loratadine (CLARITIN) 10 MG CAPS Take 10 mg by mouth daily as needed (allergies).    . magnesium oxide (MAG-OX) 400 (241.3 Mg) MG tablet Take 200 mg by mouth daily.     . metFORMIN (GLUCOPHAGE) 500 MG tablet TAKE 2 TABLETS BY MOUTH TWICE DAILY WITH A MEAL 120 tablet 2  . sacubitril-valsartan (ENTRESTO) 97-103 MG Take 0.5 tablets by mouth daily.    Marland Kitchen spironolactone (ALDACTONE) 25 MG tablet Take 25  mg by mouth daily.     No current facility-administered medications for this encounter.    Vitals:   09/03/19 1050  BP: 138/78  Pulse: 85  SpO2: 98%  Weight: 110.3 kg (243 lb 2 oz)   PHYSICAL EXAM: General:  Well appearing. No resp difficulty HEENT: normal Neck: supple. no JVD. Carotids 2+ bilat; no bruits. No lymphadenopathy or thryomegaly appreciated. Cor: PMI nondisplaced. Regular rate & rhythm. No rubs, gallops or murmurs. Lungs: clear Abdomen: soft, nontender, nondistended. No hepatosplenomegaly. No bruits or masses. Good bowel sounds. Extremities: no cyanosis, clubbing, rash, edema Neuro: alert & orientedx3, cranial nerves grossly intact. moves all 4 extremities w/o difficulty. Affect pleasant   ECG 05/01/19 : NSR 71 LBBB 116ms frequent PVCs. Personally reviewed  ASSESSMENT & PLAN:  1. Acute systolic HF - diagnosed [id number] EF ~25-30%. NICM - cath 7/20 with normal coronaries and compensated hemodynamics - suspect he likely has LBBB +/- PVC-induced CM  - Feels much better. NYHA I  - Volume status looks good - Echo 09/03/19 EF ~25% with severe LV dyssynchrony (no improvement with PVC suppression) - Zio patch 10/20 with 4.8% PVC  - Off carvedilol for now and refuses to restart b-blockersas he is worried it will drop his BP again. We discussed again today - continue spiro 25 - on Entresto 49/51 daily will increase to bid (faied titration to 97/103) - Continue Jardiance - Again discussed cMRI to evaluate for scar or infiltrative process. He will consider - Long talk about need for ICD +/- CRT for prevention of SCD and possible improvement of EF with CRT. We also discussed if no scar on CMR considering just CRT-P if he wants to avoid ICD. He will consider. Not interested in device therapy currently - labs today  2. Frequent PVCs - suspect may be related to undiagnosed OSA - PVCs improved with amio  - zio 10/20 4.8% PVC can repeat as needed - continue amio to 200 daily for  now . Can consider switch to mexilitene as needed. He refuses to retry b-blockers - home sleep study with AHI 3. (No significant OSA)  3. HTN - Blood pressure well controlled. Continue current regimen.  4. LBBB - discussion as above  5. Poorly controlled DM2 - Per his PCP. HgBA1c was 12.7 in 7/20 - Continue Jardiance - Hgba1c improving   Glori Bickers, MD  11:10 AM

## 2019-09-03 NOTE — Patient Instructions (Signed)
Change Entresto to 49/51 mg Twice daily, we have sent you in a prescription for the 49/51 mg tablets  Labs done today, your results will be available in MyChart, we will contact you for abnormal readings.  Please call and let us know if you would like the Cardiac MRI Your physician has requested that you have a cardiac MRI. Cardiac MRI uses a computer to create images of your heart as its beating, producing both still and moving pictures of your heart and major blood vessels. For further information please visit [email or web address] Please follow the instruction sheet given to you today for more information.  Please call our office in July to schedule follow up appointment  If you have any questions or concerns before your next appointment please send [place] a message through [place] or call our office at [phone].  At the [facility], you and your health needs are our priority. As part of our continuing mission to provide you with exceptional heart care, we have created designated Provider Care Teams. These Care Teams include your primary Cardiologist (physician) and Advanced Practice Providers (APPs- Physician Assistants and Nurse Practitioners) who all work together to provide you with the care you need, when you need it.   You may see any of the following providers on your designated Care Team at your next follow up: Marland Kitchen Dr Glori Bickers . Dr Loralie Champagne . Darrick Grinder, NP . Lyda Jester, PA . Audry Riles, PharmD   Please be sure to bring in all your medications bottles to every appointment.

## 2019-09-03 NOTE — Progress Notes (Signed)
  Echocardiogram 2D Echocardiogram has been performed.  Kevin Hampton 09/03/2019, 9:38 AM

## 2019-09-06 ENCOUNTER — Other Ambulatory Visit: Payer: Self-pay | Admitting: Critical Care Medicine

## 2019-09-06 ENCOUNTER — Other Ambulatory Visit: Payer: Self-pay | Admitting: Nurse Practitioner

## 2019-09-06 DIAGNOSIS — E11630 Type 2 diabetes mellitus with periodontal disease: Secondary | ICD-10-CM

## 2019-09-10 ENCOUNTER — Ambulatory Visit: Payer: BC Managed Care – PPO | Admitting: Physician Assistant

## 2019-09-10 ENCOUNTER — Other Ambulatory Visit: Payer: Self-pay

## 2019-09-10 VITALS — BP 147/73 | HR 41 | Temp 98.7°F | Resp 20 | Ht 76.0 in | Wt 243.0 lb

## 2019-09-10 DIAGNOSIS — E663 Overweight: Secondary | ICD-10-CM

## 2019-09-10 DIAGNOSIS — E11630 Type 2 diabetes mellitus with periodontal disease: Secondary | ICD-10-CM

## 2019-09-10 DIAGNOSIS — I447 Left bundle-branch block, unspecified: Secondary | ICD-10-CM

## 2019-09-10 DIAGNOSIS — I5022 Chronic systolic (congestive) heart failure: Secondary | ICD-10-CM

## 2019-09-10 DIAGNOSIS — I493 Ventricular premature depolarization: Secondary | ICD-10-CM | POA: Insufficient documentation

## 2019-09-10 DIAGNOSIS — I10 Essential (primary) hypertension: Secondary | ICD-10-CM

## 2019-09-10 LAB — POCT GLYCOSYLATED HEMOGLOBIN (HGB A1C): Hemoglobin A1C: 8.7 % — AB (ref 4.0–5.6)

## 2019-09-10 LAB — GLUCOSE, POCT (MANUAL RESULT ENTRY): POC Glucose: 343 mg/dl — AB (ref 70–99)

## 2019-09-10 MED ORDER — METFORMIN HCL 500 MG PO TABS: ORAL_TABLET | ORAL | 2 refills | Status: DC

## 2019-09-10 MED ORDER — GLIPIZIDE 10 MG PO TABS: ORAL_TABLET | ORAL | 2 refills | Status: DC

## 2019-09-10 MED ORDER — JARDIANCE 10 MG PO TABS: 10.0000 mg | ORAL_TABLET | Freq: Every day | ORAL | 2 refills | Status: DC

## 2019-09-10 NOTE — Patient Instructions (Signed)
Start Vit D 2,000 units daily Keep up the great work!   Mediterranean Diet A Mediterranean diet refers to food and lifestyle choices that are based on the traditions of countries located on the [employer]. This way of eating has been shown to help prevent certain conditions and improve outcomes for people who have chronic diseases, like kidney disease and heart disease. What are tips for following this plan? Lifestyle  Cook and eat meals together with your family, when possible.  Drink enough fluid to keep your urine clear or pale yellow.  Be physically active every day. This includes: ? Aerobic exercise like running or swimming. ? Leisure activities like gardening, walking, or housework.  Get 7-8 hours of sleep each night.  If recommended by your health care provider, drink red wine in moderation. This means 1 glass a day for nonpregnant women and 2 glasses a day for men. A glass of wine equals 5 oz (150 mL). Reading food labels   Check the serving size of packaged foods. For foods such as rice and pasta, the serving size refers to the amount of cooked product, not dry.  Check the total fat in packaged foods. Avoid foods that have saturated fat or trans fats.  Check the ingredients list for added sugars, such as corn syrup. Shopping  At the grocery store, buy most of your food from the areas near the walls of the store. This includes: ? Fresh fruits and vegetables (produce). ? Grains, beans, nuts, and seeds. Some of these may be available in unpackaged forms or large amounts (in bulk). ? Fresh seafood. ? Poultry and eggs. ? Low-fat dairy products.  Buy whole ingredients instead of prepackaged foods.  Buy fresh fruits and vegetables in-season from local farmers markets.  Buy frozen fruits and vegetables in resealable bags.  If you do not have access to quality fresh seafood, buy precooked frozen shrimp or canned fish, such as tuna, salmon, or sardines.  Buy small  amounts of raw or cooked vegetables, salads, or olives from the deli or salad bar at your store.  Stock your pantry so you always have certain foods on hand, such as olive oil, canned tuna, canned tomatoes, rice, pasta, and beans. Cooking  Cook foods with extra-virgin olive oil instead of using butter or other vegetable oils.  Have meat as a side dish, and have vegetables or grains as your main dish. This means having meat in small portions or adding small amounts of meat to foods like pasta or stew.  Use beans or vegetables instead of meat in common dishes like chili or lasagna.  Experiment with different cooking methods. Try roasting or broiling vegetables instead of steaming or sauteing them.  Add frozen vegetables to soups, stews, pasta, or rice.  Add nuts or seeds for added healthy fat at each meal. You can add these to yogurt, salads, or vegetable dishes.  Marinate fish or vegetables using olive oil, lemon juice, garlic, and fresh herbs. Meal planning   Plan to eat 1 vegetarian meal one day each week. Try to work up to 2 vegetarian meals, if possible.  Eat seafood 2 or more times a week.  Have healthy snacks readily available, such as: ? Vegetable sticks with hummus. ? [place] yogurt. ? Fruit and nut trail mix.  Eat balanced meals throughout the week. This includes: ? Fruit: 2-3 servings a day ? Vegetables: 4-5 servings a day ? Low-fat dairy: 2 servings a day ? Fish, poultry, or lean meat: 1  serving a day ? Beans and legumes: 2 or more servings a week ? Nuts and seeds: 1-2 servings a day ? Whole grains: 6-8 servings a day ? Extra-virgin olive oil: 3-4 servings a day  Limit red meat and sweets to only a few servings a month What are my food choices?  Mediterranean diet ? Recommended  Grains: Whole-grain pasta. Brown rice. Bulgar wheat. Polenta. Couscous. Whole-wheat bread. [place].  Vegetables: Artichokes. Beets. Broccoli. Cabbage. Carrots. Eggplant. Green  beans. Chard. Kale. Spinach. Onions. Leeks. Peas. Squash. Tomatoes. Peppers. Radishes.  Fruits: Apples. Apricots. Avocado. Berries. Bananas. Cherries. Dates. Figs. Grapes. Lemons. Melon. Oranges. Peaches. Plums. Pomegranate.  Meats and other protein foods: Beans. Almonds. Sunflower seeds. Pine nuts. Peanuts. [place]. Salmon. Scallops. Shrimp. [place]. Tilapia. Clams. Oysters. Eggs.  Dairy: Low-fat milk. Cheese. Greek yogurt.  Beverages: Water. Red wine. Herbal tea.  Fats and oils: Extra virgin olive oil. Avocado oil. Grape seed oil.  Sweets and desserts: [place] yogurt with honey. Baked apples. Poached pears. Trail mix.  Seasoning and other foods: Basil. Cilantro. Coriander. Cumin. Mint. Parsley. Sage. Rosemary. Tarragon. Garlic. Oregano. Thyme. Pepper. Balsalmic vinegar. Tahini. Hummus. Tomato sauce. Olives. Mushrooms. ? Limit these  Grains: Prepackaged pasta or rice dishes. Prepackaged cereal with added sugar.  Vegetables: Deep fried potatoes (french fries).  Fruits: Fruit canned in syrup.  Meats and other protein foods: Beef. Pork. Lamb. Poultry with skin. Hot dogs. Berniece Salines.  Dairy: Ice cream. Sour cream. Whole milk.  Beverages: Juice. Sugar-sweetened soft drinks. Beer. Liquor and spirits.  Fats and oils: Butter. Canola oil. Vegetable oil. Beef fat (tallow). Lard.  Sweets and desserts: Cookies. Cakes. Pies. Candy.  Seasoning and other foods: Mayonnaise. Premade sauces and marinades. The items listed may not be a complete list. Talk with your dietitian about what dietary choices are right for you. Summary  The Mediterranean diet includes both food and lifestyle choices.  Eat a variety of fresh fruits and vegetables, beans, nuts, seeds, and whole grains.  Limit the amount of red meat and sweets that you eat.  Talk with your health care provider about whether it is safe for you to drink red wine in moderation. This means 1 glass a day for nonpregnant women and 2 glasses a day for men.  A glass of wine equals 5 oz (150 mL). This information is not intended to replace advice given to you by your health care provider. Make sure you discuss any questions you have with your health care provider. Document Revised: 01/27/2016 Document Reviewed: 01/20/2016 Elsevier Patient Education  [place].

## 2019-09-10 NOTE — Progress Notes (Signed)
Established Patient Office Visit  Subjective:  Patient ID: Kevin Hampton, male    DOB: 04-02-1966  Age: 54 y.o. MRN: [id number]  CC:  Chief Complaint  Patient presents with  . Medication Refill  . Diabetes    HPI Kevin Hampton presents for medication management.  Reports that he has been doing well overall, states that he has been working on controlling his blood sugars, states that he has been checking them on a daily basis, states his morning readings run 130-180, states that his evening readings tend to be higher, states that he is not always fasting when he takes his evening readings.  reports he is not having any issues or adverse effects from his diabetes medications.  States that he has been taking his Jardiance 10 mg daily, states it is affordable.  Reports that he saw cardiology on September 03, 2019, states that he had a echocardiogram completed, states that they are working on controlling his irregular heart rate.  Denies fatigue, headaches.  States that he has been able to be more active, is working as a Restaurant manager, fast food.  Reports that he checks his blood pressure at home, states his average blood pressure is 115/80, denies any low readings.  Assessment and plan from recent cardiology visit:  ASSESSMENT & PLAN:  1. Acute systolic HF - diagnosed [id number] EF ~25-30%. NICM - cath 7/20 with normal coronaries and compensated hemodynamics - suspect he likely has LBBB +/- PVC-induced CM  - Feels much better. NYHA I  - Volume status looks good - Echo 09/03/19 EF ~25% with severe LV dyssynchrony (no improvement with PVC suppression) - Zio patch 10/20 with 4.8% PVC  - Off carvedilol for now and refuses to restart b-blockersas he is worried it will drop his BP again. We discussed again today - continue spiro 25 - on Entresto 49/51 daily will increase to bid (faied titration to 97/103) - Continue Jardiance - Again discussed cMRI to evaluate for scar or infiltrative process. He  will consider - Long talk about need for ICD +/- CRT for prevention of SCD and possible improvement of EF with CRT. We also discussed if no scar on CMR considering just CRT-P if he wants to avoid ICD. He will consider. Not interested in device therapy currently - labs today  2. Frequent PVCs - suspect may be related to undiagnosed OSA - PVCs improved with amio  - zio 10/20 4.8% PVC can repeat as needed - continue amio to 200 daily for now . Can consider switch to mexilitene as needed. He refuses to retry b-blockers - home sleep study with AHI 3. (No significant OSA)  3. HTN - Blood pressure well controlled. Continue current regimen.  4. LBBB - discussion as above  5. Poorly controlled DM2 - Per his PCP. HgBA1c was 12.7 in 7/20 - Continue Jardiance - Hgba1c improving   Past Medical History:  Diagnosis Date  . Essential hypertension   . Hypertensive emergency 12/15/2018  . Obesity   . Systolic heart failure ([place])   . Type 2 diabetes mellitus with complication [facility])     Past Surgical History:  Procedure Laterality Date  . RIGHT/LEFT HEART CATH AND CORONARY ANGIOGRAPHY N/A 12/16/2018   Procedure: RIGHT/LEFT HEART CATH AND CORONARY ANGIOGRAPHY;  Surgeon: Lorretta Harp, MD;  Location: [facility];  Service: Cardiovascular;  Laterality: N/A;    Family History  Problem Relation Age of Onset  . Hypertension Mother   . Heart attack Mother  Social History   Socioeconomic History  . Marital status: Married    Spouse name: Not on file  . Number of children: Not on file  . Years of education: Not on file  . Highest education level: Not on file  Occupational History  . Not on file  Tobacco Use  . Smoking status: Former Research scientist (life sciences)  . Smokeless tobacco: Never Used  Substance and Sexual Activity  . Alcohol use: Never  . Drug use: Never  . Sexual activity: Yes  Other Topics Concern  . Not on file  Social History Narrative  . Not on file   Social Determinants  of Health   Financial Resource Strain:   . Difficulty of Paying Living Expenses:   Food Insecurity:   . Worried About [name] in the Last Year:   . Arboriculturist in the Last Year:   Transportation Needs:   . Film/video editor (Medical):   Marland Kitchen Lack of Transportation (Non-Medical):   Physical Activity:   . Days of Exercise per Week:   . Minutes of Exercise per Session:   Stress:   . Feeling of Stress :   Social Connections:   . Frequency of Communication with Friends and Family:   . Frequency of Social Gatherings with Friends and Family:   . Attends Religious Services:   . Active Member of Clubs or Organizations:   . Attends Archivist Meetings:   Marland Kitchen Marital Status:   Intimate Partner Violence:   . Fear of Current or Ex-Partner:   . Emotionally Abused:   Marland Kitchen Physically Abused:   . Sexually Abused:     Outpatient Medications Prior to Visit  Medication Sig Dispense Refill  . amiodarone (PACERONE) 200 MG tablet Take 1 tablet (200 mg total) by mouth daily. 90 tablet 3  . Loratadine (CLARITIN) 10 MG CAPS Take 10 mg by mouth daily as needed (allergies).    . magnesium oxide (MAG-OX) 400 (241.3 Mg) MG tablet Take 200 mg by mouth daily.     . sacubitril-valsartan (ENTRESTO) 49-51 MG Take 1 tablet by mouth 2 (two) times daily. 60 tablet 6  . spironolactone (ALDACTONE) 25 MG tablet Take 25 mg by mouth daily.    . empagliflozin (JARDIANCE) 10 MG TABS tablet Take 10 mg by mouth daily. Must have office visit for refills 30 tablet 0  . glipiZIDE (GLUCOTROL) 10 MG tablet TAKE 1 TABLET BY MOUTH TWICE DAILY BEFORE A MEAL 60 tablet 0  . metFORMIN (GLUCOPHAGE) 500 MG tablet TAKE 2 TABLETS BY MOUTH TWICE DAILY WITH A MEAL 120 tablet 2   No facility-administered medications prior to visit.    Allergies  Allergen Reactions  . Coreg [Carvedilol] Other (See Comments)    Brings heart rate and blood pressure down severly    ROS Review of Systems  Constitutional:  Negative.   HENT: Negative.   Eyes: Negative.   Respiratory: Negative.   Cardiovascular: Negative for chest pain.  Gastrointestinal: Negative.   Endocrine: Negative.   Genitourinary: Negative.   Musculoskeletal: Negative.   Skin: Negative.   Allergic/Immunologic: Negative.   Neurological: Negative.  Negative for dizziness, syncope and weakness.  Hematological: Negative.   Psychiatric/Behavioral: Negative.       Objective:    Physical Exam  Constitutional: He is oriented to person, place, and time. He appears well-developed and well-nourished. No distress.  HENT:  Head: Normocephalic and atraumatic.  Right Ear: External ear normal.  Left Ear: External ear normal.  Nose: Nose normal.  Mouth/Throat: Oropharynx is clear and moist.  Eyes: Pupils are equal, round, and reactive to light. Conjunctivae and EOM are normal.  Cardiovascular: An irregular rhythm present. Bradycardia present.  Pulmonary/Chest: Effort normal and breath sounds normal.  Abdominal: Soft. Bowel sounds are normal.  Musculoskeletal:        General: Normal range of motion.     Cervical back: Normal range of motion and neck supple.  Neurological: He is alert and oriented to person, place, and time.  Skin: Skin is warm and dry. He is not diaphoretic.  Psychiatric: He has a normal mood and affect. His behavior is normal. Judgment and thought content normal.  Nursing note and vitals reviewed.   BP (!) 147/73 (BP Location: Left Arm)   Pulse (!) 41   Temp 98.7 F (37.1 C) (Oral)   Resp 20   Ht 6\' 4"  (1.93 m)   Wt 243 lb (110.2 kg)   SpO2 98%   BMI 29.58 kg/m  Wt Readings from Last 3 Encounters:  09/10/19 243 lb (110.2 kg)  09/03/19 243 lb 2 oz (110.3 kg)  05/01/19 245 lb 6.4 oz (111.3 kg)     Health Maintenance Due  Topic Date Due  . PNEUMOCOCCAL POLYSACCHARIDE VACCINE AGE 71-64 HIGH RISK  Never done  . OPHTHALMOLOGY EXAM  Never done  . COLONOSCOPY  Never done    There are no preventive care  reminders to display for this patient.  Lab Results  Component Value Date   TSH 0.845 05/01/2019   Lab Results  Component Value Date   WBC 4.7 12/27/2018   HGB 15.8 12/27/2018   HCT 47.5 12/27/2018   MCV 91.2 12/27/2018   PLT 204 12/27/2018   Lab Results  Component Value Date   NA 140 09/03/2019   K 4.7 09/03/2019   CO2 25 09/03/2019   GLUCOSE 184 (H) 09/03/2019   BUN 14 09/03/2019   CREATININE 1.20 09/03/2019   BILITOT 0.6 09/03/2019   ALKPHOS 68 09/03/2019   AST 21 09/03/2019   ALT 22 09/03/2019   PROT 6.8 09/03/2019   ALBUMIN 4.0 09/03/2019   CALCIUM 10.1 09/03/2019   ANIONGAP 13 09/03/2019   Lab Results  Component Value Date   CHOL 192 12/15/2018   Lab Results  Component Value Date   HDL 52 12/15/2018   Lab Results  Component Value Date   LDLCALC 81 12/15/2018   Lab Results  Component Value Date   TRIG 293 (H) 12/15/2018   Lab Results  Component Value Date   CHOLHDL 3.7 12/15/2018   Lab Results  Component Value Date   HGBA1C 8.7 (A) 09/10/2019      Assessment & Plan:   Problem List Items Addressed This Visit      Cardiovascular and Mediastinum   Chronic systolic heart failure (HCC) - Primary   HTN (hypertension)   Left bundle branch block   Frequent PVCs     Endocrine   DM2 (diabetes mellitus, type 2) (HCC)   Relevant Medications   empagliflozin (JARDIANCE) 10 MG TABS tablet   glipiZIDE (GLUCOTROL) 10 MG tablet   metFORMIN (GLUCOPHAGE) 500 MG tablet   Other Relevant Orders   HgB A1c (Completed)   Glucose (CBG) (Completed)     Other   Overweight (BMI 25.0-29.9)    1. Type 2 diabetes mellitus with periodontal disease, without long-term current use of insulin (HCC) Patient's A1c has improved from 10.0 to 8.7.  Encouraged patient to continue working on controlling  diet, checking blood sugars to help learn which foods are elevating his blood sugars.  Encouraged patient to start vitamin D 2000 units over-the-counter.  Gave patient  education on Mediterranean diet - empagliflozin (JARDIANCE) 10 MG TABS tablet; Take 10 mg by mouth daily.  Dispense: 30 tablet; Refill: 2 - glipiZIDE (GLUCOTROL) 10 MG tablet; TAKE 1 TABLET BY MOUTH TWICE DAILY BEFORE A MEAL  Dispense: 60 tablet; Refill: 2 - metFORMIN (GLUCOPHAGE) 500 MG tablet; TAKE 2 TABLETS BY MOUTH TWICE DAILY WITH A MEAL  Dispense: 120 tablet; Refill: 2 - HgB A1c - Glucose (CBG)  2. Chronic systolic heart failure ([place]) Continue follow-up with cardiology echocardiogram was completed on September 03, 2019  3. Essential hypertension The current medical regimen is effective;  continue present plan and medications.  Medications managed by cardiology  4. Overweight (BMI 25.0-29.9)   5. Left bundle branch block   6. Frequent PVCs Patient's heart rate today was irregular, patient did not want to have an EKG completed, states that he is due to follow-up with cardiology for repeat EKG.  Patient not in distress, insisted that he felt fine.    I have reviewed the patient's medical history (PMH, PSH, Social History, Family History, Medications, and allergies) , and have been updated if relevant. I spent 30 minutes reviewing chart and  face to face time with patient.     Meds ordered this encounter  Medications  . empagliflozin (JARDIANCE) 10 MG TABS tablet    Sig: Take 10 mg by mouth daily.    Dispense:  30 tablet    Refill:  2    Must have office visit for refills    Order Specific Question:   Supervising Provider    Answer:   Elsie Stain [[id number]]  . glipiZIDE (GLUCOTROL) 10 MG tablet    Sig: TAKE 1 TABLET BY MOUTH TWICE DAILY BEFORE A MEAL    Dispense:  60 tablet    Refill:  2    Order Specific Question:   Supervising Provider    Answer:   Joya Gaskins, PATRICK E [[id number]]  . metFORMIN (GLUCOPHAGE) 500 MG tablet    Sig: TAKE 2 TABLETS BY MOUTH TWICE DAILY WITH A MEAL    Dispense:  120 tablet    Refill:  2    Order Specific Question:   Supervising Provider    Answer:    Noralyn Pick    Follow-up: Return in about 3 months (around 12/10/2019) for with Geryl Rankins at [facility].    Loraine Grip Mayers, PA-C

## 2019-09-19 ENCOUNTER — Telehealth (HOSPITAL_COMMUNITY): Payer: Self-pay | Admitting: *Deleted

## 2019-09-19 NOTE — Telephone Encounter (Signed)
Pts wife called to report pts heart rate 36 and she couldn't get a blood pressure reading. She said she used the same machine and was able to get a reading for her but pts bp would not register. She said pt has c/o of feeling "weird" since increasing entresto to 49/51mg  bid on 3/24. I advised her that patient needed to go to the emergency room with readings that low. She told me patient already went to work and said he felt ok enough to work. I asked her had patient taken his morning medications yet she said everything but entresto and amiodarone. I told her to hold entresto and amiodarone at least until I spoke with a provider. I called Amy Clegg,NP and she said patient needed to go to the emergency room. I called patients wife back and continued to encourage the need for pt to go to the emergency room she said she would call patient at work and have him go but he would more than likely wait a few hours or not go at all. I explained to her the risks of patient not going to the emergency room and I asked her to please call me back and let me know that he was going to be evaluated. Wife agreed to calling me back and said she would encourage pt to go to the emergency room.

## 2019-09-22 NOTE — Telephone Encounter (Signed)
pts wife left VM stating patient was did not go to the emergency room Friday. He has not taken amiodarone or entresto since Thursday.  BP today 148/89 and heart rate 41. She said patient feels "fine" and wants to know how if he should restart medications at the same dose.   Routed to [employer], [place] for advice

## 2019-09-23 NOTE — Telephone Encounter (Signed)
Pts wife aware and agreeable with plan.  

## 2019-09-23 NOTE — Telephone Encounter (Signed)
Spoke with patients wife they are in [place] and will not be back until this weekend. Pts bp this morning was 107/64 and heart rate 49. She has continued to hold amiodarone and entresto. Pt is asymptomatic.

## 2019-10-20 ENCOUNTER — Telehealth (HOSPITAL_COMMUNITY): Payer: Self-pay | Admitting: *Deleted

## 2019-10-20 NOTE — Telephone Encounter (Signed)
Reaching out to patient to offer assistance regarding upcoming cardiac imaging study; pt verbalizes understanding of appt date/time, parking situation and where to check in, and verified current allergies; name and call back number provided for further questions should they arise  Aidel Davisson Tai RN Navigator Cardiac Imaging [facility] [phone] office [phone] cell 

## 2019-10-21 ENCOUNTER — Other Ambulatory Visit: Payer: Self-pay

## 2019-10-21 ENCOUNTER — Ambulatory Visit (HOSPITAL_COMMUNITY)
Admission: RE | Admit: 2019-10-21 | Discharge: 2019-10-21 | Disposition: A | Discharge status: Home / Self Care | Payer: BC Managed Care – PPO | Source: Ambulatory Visit | Attending: Internal Medicine | Admitting: Internal Medicine

## 2019-10-21 DIAGNOSIS — I509 Heart failure, unspecified: Secondary | ICD-10-CM

## 2019-10-21 MED ORDER — GADOBUTROL 1 MMOL/ML IV SOLN
10.0000 mL | Freq: Once | INTRAVENOUS | Status: AC | PRN
  Administered 2019-10-21: 10 mL via INTRAVENOUS

## 2019-10-28 ENCOUNTER — Other Ambulatory Visit: Payer: Self-pay

## 2019-10-28 ENCOUNTER — Encounter (HOSPITAL_COMMUNITY): Payer: Self-pay | Admitting: Internal Medicine

## 2019-10-28 ENCOUNTER — Ambulatory Visit (HOSPITAL_COMMUNITY)
Admission: RE | Admit: 2019-10-28 | Discharge: 2019-10-28 | Disposition: A | Discharge status: Home / Self Care | Payer: BC Managed Care – PPO | Source: Ambulatory Visit | Attending: Internal Medicine | Admitting: Internal Medicine

## 2019-10-28 VITALS — BP 170/110 | HR 103 | Wt 232.6 lb

## 2019-10-28 DIAGNOSIS — E1165 Type 2 diabetes mellitus with hyperglycemia: Secondary | ICD-10-CM | POA: Insufficient documentation

## 2019-10-28 DIAGNOSIS — I5023 Acute on chronic systolic (congestive) heart failure: Secondary | ICD-10-CM | POA: Insufficient documentation

## 2019-10-28 DIAGNOSIS — I252 Old myocardial infarction: Secondary | ICD-10-CM | POA: Insufficient documentation

## 2019-10-28 DIAGNOSIS — I447 Left bundle-branch block, unspecified: Secondary | ICD-10-CM | POA: Diagnosis not present

## 2019-10-28 DIAGNOSIS — Z87891 Personal history of nicotine dependence: Secondary | ICD-10-CM | POA: Diagnosis not present

## 2019-10-28 DIAGNOSIS — I428 Other cardiomyopathies: Secondary | ICD-10-CM | POA: Insufficient documentation

## 2019-10-28 DIAGNOSIS — R891 Abnormal level of hormones in specimens from other organs, systems and tissues: Secondary | ICD-10-CM | POA: Insufficient documentation

## 2019-10-28 DIAGNOSIS — Z79899 Other long term (current) drug therapy: Secondary | ICD-10-CM | POA: Diagnosis not present

## 2019-10-28 DIAGNOSIS — I110 Hypertensive heart disease with heart failure: Secondary | ICD-10-CM | POA: Insufficient documentation

## 2019-10-28 DIAGNOSIS — Z7984 Long term (current) use of oral hypoglycemic drugs: Secondary | ICD-10-CM | POA: Insufficient documentation

## 2019-10-28 DIAGNOSIS — I5022 Chronic systolic (congestive) heart failure: Secondary | ICD-10-CM

## 2019-10-28 DIAGNOSIS — Z8249 Family history of ischemic heart disease and other diseases of the circulatory system: Secondary | ICD-10-CM | POA: Insufficient documentation

## 2019-10-28 DIAGNOSIS — I493 Ventricular premature depolarization: Secondary | ICD-10-CM | POA: Diagnosis not present

## 2019-10-28 LAB — COMPREHENSIVE METABOLIC PANEL WITH GFR
ALT: 24 U/L (ref 0–44)
AST: 22 U/L (ref 15–41)
Albumin: 3.8 g/dL (ref 3.5–5.0)
Alkaline Phosphatase: 76 U/L (ref 38–126)
Anion gap: 16 — ABNORMAL HIGH (ref 5–15)
BUN: 18 mg/dL (ref 6–20)
CO2: 24 mmol/L (ref 22–32)
Calcium: 10.5 mg/dL — ABNORMAL HIGH (ref 8.9–10.3)
Chloride: 99 mmol/L (ref 98–111)
Creatinine, Ser: 1.32 mg/dL — ABNORMAL HIGH (ref 0.61–1.24)
GFR calc Af Amer: >60 mL/min
GFR calc non Af Amer: >60 mL/min
Glucose, Bld: 343 mg/dL — ABNORMAL HIGH (ref 70–99)
Potassium: 4.4 mmol/L (ref 3.5–5.1)
Sodium: 139 mmol/L (ref 135–145)
Total Bilirubin: 0.7 mg/dL (ref 0.3–1.2)
Total Protein: 7 g/dL (ref 6.5–8.1)

## 2019-10-28 LAB — CBC
HCT: 51.9 % (ref 39.0–52.0)
Hemoglobin: 17.5 g/dL — ABNORMAL HIGH (ref 13.0–17.0)
MCH: 31 pg (ref 26.0–34.0)
MCHC: 33.7 g/dL (ref 30.0–36.0)
MCV: 91.9 fL (ref 80.0–100.0)
Platelets: 189 10*3/uL (ref 150–400)
RBC: 5.65 MIL/uL (ref 4.22–5.81)
RDW: 12.4 % (ref 11.5–15.5)
WBC: 5.6 10*3/uL (ref 4.0–10.5)
nRBC: 0 % (ref 0.0–0.2)

## 2019-10-28 LAB — T4, FREE: Free T4: 2.26 ng/dL — ABNORMAL HIGH (ref 0.61–1.12)

## 2019-10-28 LAB — TSH: TSH: 0.955 u[IU]/mL (ref 0.350–4.500)

## 2019-10-28 LAB — BRAIN NATRIURETIC PEPTIDE: B Natriuretic Peptide: 49.4 pg/mL (ref 0.0–100.0)

## 2019-10-28 MED ORDER — AMIODARONE HCL 200 MG PO TABS
200.0000 mg | ORAL_TABLET | Freq: Two times a day (BID) | ORAL | 3 refills | Status: DC
Start: 2019-10-28 — End: 2020-02-18

## 2019-10-28 MED ORDER — ENTRESTO 24-26 MG PO TABS
1.0000 | ORAL_TABLET | Freq: Two times a day (BID) | ORAL | 3 refills | Status: DC
Start: 2019-10-28 — End: 2019-11-18

## 2019-10-28 NOTE — Progress Notes (Addendum)
ADVANCED HF CLINIC NOTE  HPI:  [name] is a 54 year old male with a history of HTN and poorly controlled diabetes who was admitted to [facility] from 7/5-7/20/20 with hypertensive urgency and acute systolic HF. ECG showed LBBB of uncertain duration.  He was admitted to the ICU and placed on IV NTG and underwent diuresis. Echocardiogram showed severe systolic dysfunction with EF 25-30%. Cardiac catheterization on 12/16/18 showed normal coronary arteries with well compensated filling pressures after diuresis.   RA = 4 RV = 34/3 PA = 31/8 (19) PCWP =  11 Fick = 5.1/2.24  He was started on GDMT including carvedilol 6.25 bid, Entresto 49/51 bid, spironolactone 12.5 daily. He did well for several days but then developed severe fatigue. I saw him in Clinic on 12/27/18. He was in bigeminy. I stopped his b-blocker and started amio 200 bid.K 5.0 Mg 1.7.  LifeVest placed.  He returns for f/u with his wife. At the end of March, he was feeling fine. Only taking Entresto 49/51 once a day. I increased to bid. Began to feel weak. Called clinic with HR of 36. Asked to go to the ER but did not go at that time. Amio and Entresto stopped at that time. Very fatigued. HR 46-70. SBP ranges 110-150.  Not swelling.    cMRI 10/21/19 1. Very difficult study due to frequent ectopy and respiratory artifact. 2. Moderately dilated left ventricle with estimated EF 20-25% (unable to quantify due to respiratory artifact and ectopy). Diffuse hypokinesis with basal to mid inferolateral akinesis. 3.  Normal RV size and systolic function. 4. Delayed enhancement images were also very difficult. However, there was clearly a dense basal to mid inferolateral scar. This looked most consistent with a prior MI in the LCx territory.   Echo 09/03/19: EF 25% RV ok. Personally reviewed  Echo 11/19 EF ~35-40% with severe dyssynchrony due to LBBB and PVCs Personally reviewed  Zio patch 10/20: 4.8%PVCs  SH:    Social History   Socioeconomic History  . Marital status: Married    Spouse name: Not on file  . Number of children: Not on file  . Years of education: Not on file  . Highest education level: Not on file  Occupational History  . Not on file  Tobacco Use  . Smoking status: Former Research scientist (life sciences)  . Smokeless tobacco: Never Used  Substance and Sexual Activity  . Alcohol use: Never  . Drug use: Never  . Sexual activity: Yes  Other Topics Concern  . Not on file  Social History Narrative  . Not on file   Social Determinants of Health   Financial Resource Strain:   . Difficulty of Paying Living Expenses:   Food Insecurity:   . Worried About [name] in the Last Year:   . Arboriculturist in the Last Year:   Transportation Needs:   . Film/video editor (Medical):   Marland Kitchen Lack of Transportation (Non-Medical):   Physical Activity:   . Days of Exercise per Week:   . Minutes of Exercise per Session:   Stress:   . Feeling of Stress :   Social Connections:   . Frequency of Communication with Friends and Family:   . Frequency of Social Gatherings with Friends and Family:   . Attends Religious Services:   . Active Member of Clubs or Organizations:   . Attends Archivist Meetings:   Marland Kitchen Marital Status:   Intimate  Partner Violence:   . Fear of Current or Ex-Partner:   . Emotionally Abused:   Marland Kitchen Physically Abused:   . Sexually Abused:     FH:  Family History  Problem Relation Age of Onset  . Hypertension Mother   . Heart attack Mother     Past Medical History:  Diagnosis Date  . Essential hypertension   . Hypertensive emergency 12/15/2018  . Obesity   . Systolic heart failure ([place])   . Type 2 diabetes mellitus with complication (HCC)     Current Outpatient Medications  Medication Sig Dispense Refill  . empagliflozin (JARDIANCE) 10 MG TABS tablet Take 10 mg by mouth daily. 30 tablet 2  . glipiZIDE (GLUCOTROL) 10 MG tablet TAKE 1 TABLET BY MOUTH TWICE DAILY  BEFORE A MEAL 60 tablet 2  . Loratadine (CLARITIN) 10 MG CAPS Take 10 mg by mouth daily as needed (allergies).    . magnesium oxide (MAG-OX) 400 (241.3 Mg) MG tablet Take 200 mg by mouth daily.     . metFORMIN (GLUCOPHAGE) 1000 MG tablet Take 1,000 mg by mouth 2 (two) times daily with a meal.    . spironolactone (ALDACTONE) 25 MG tablet Take 25 mg by mouth daily.     No current facility-administered medications for this encounter.    Vitals:   10/28/19 1417  BP: (!) 170/110  Pulse: (!) 103  SpO2: 96%  Weight: 105.5 kg (232 lb 9.6 oz)   PHYSICAL EXAM: General:  Well appearing. No resp difficulty HEENT: normal Neck: supple. no JVD. Carotids 2+ bilat; no bruits. No lymphadenopathy or thryomegaly appreciated. Cor: PMI nondisplaced. Regular rate & rhythm. No rubs, gallops or murmurs. Lungs: clear Abdomen: soft, nontender, nondistended. No hepatosplenomegaly. No bruits or masses. Good bowel sounds. Extremities: no cyanosis, clubbing, rash, edema Neuro: alert & orientedx3, cranial nerves grossly intact. moves all 4 extremities w/o difficulty. Affect pleasant   ECG 05/01/19 : NSR 71 LBBB 119ms frequent PVCs. Personally reviewed  ASSESSMENT & PLAN:  1. Acute systolic HF - diagnosed [id number] EF ~25-30%. NICM - cath 7/20 with normal coronaries and compensated hemodynamics - suspect he likely has LBBB +/- PVC-induced CM  - Echo 09/03/19 EF ~25% with severe LV dyssynchrony (no improvement with PVC suppression) - Zio patch 10/20 with 4.8% PVC  - cMRI 5/21 EF 20-25% frequent ectopy. RV normal. Inferolateral scar - Feels much worse today. NYHA III in setting of being off HF meds.  - Volume status mildly elevated  - Restart Entresto 24/26 bid - Restart amio 200 bid - Refuses all b-blockers as he is worried it will drop his BP again. - Continue spiro 25 - Continue Jardiance - cMRI reviewed with him personally suspect he has PVC/LBBB CM but presence of ? Inferolateral scar is puzzling. Will  review with imaging team. Restart amio.  - Refer to EP to discuss PVC suppression strategy and possible need for CRT - labs today  2. Frequent PVCs - suspect may be related to undiagnosed OSA - PVCs improved with amio but now recurring off amio -> will restart - zio 10/20 4.8% PVC can repeat as needed - Can consider switch to mexilitene as needed. He refuses to retry b-blockers - home sleep study with AHI 3. (No significant OSA)  3. HTN - Blood pressure up. Restart Entresto  4. LBBB - discussion as above  5. Poorly controlled DM2 - Per his PCP. HgBA1c was 12.7 in 7/20 - Continue Jardiance - Hgba1c improving  6. Course facial features -  his son has astutely noted progressive coarsening of his father's features and asked [place] to check growth hormone. - it is markedly elevated. - will refer to Endo (Dr. Renne Crigler)  - suspect this may also play a role in cardiomyoapthy   Total time spent 45 minutes. Over half that time spent discussing above.   Glori Bickers, MD  11:12 PM

## 2019-10-28 NOTE — Patient Instructions (Signed)
Start Entresto 24/26 mg Twice daily   Start Amiodarone 200 mg Twice daily   Labs done today, your results will be available in MyChart, we will contact you for abnormal readings.  You have been referred to Dr Caryl Comes at [facility], his office will call you to schedule this  Your physician recommends that you schedule a follow-up appointment in: 2 weeks  If you have any questions or concerns before your next appointment please send [place] a message through [place] or call our office at [phone].  At the [facility], you and your health needs are our priority. As part of our continuing mission to provide you with exceptional heart care, we have created designated Provider Care Teams. These Care Teams include your primary Cardiologist (physician) and Advanced Practice Providers (APPs- Physician Assistants and Nurse Practitioners) who all work together to provide you with the care you need, when you need it.   You may see any of the following providers on your designated Care Team at your next follow up: Marland Kitchen Dr Glori Bickers . Dr Loralie Champagne . Darrick Grinder, NP . Lyda Jester, PA . Audry Riles, PharmD   Please be sure to bring in all your medications bottles to every appointment.

## 2019-10-29 LAB — GROWTH HORMONE: Growth Hormone: 27.9 ng/mL — ABNORMAL HIGH (ref 0.0–10.0)

## 2019-10-29 LAB — T3, FREE: T3, Free: 7.1 pg/mL — ABNORMAL HIGH (ref 2.0–4.4)

## 2019-10-30 ENCOUNTER — Telehealth (HOSPITAL_COMMUNITY): Payer: Self-pay | Admitting: *Deleted

## 2019-10-30 DIAGNOSIS — E220 Acromegaly and pituitary gigantism: Secondary | ICD-10-CM

## 2019-10-30 NOTE — Telephone Encounter (Signed)
-----   Message from Jolaine Artist, MD sent at 10/29/2019 11:04 PM EDT ----- Patient appears to have acromegaly.please refer to Dr. Monna Fam ASAP. Remind me to call patient in am

## 2019-11-18 ENCOUNTER — Encounter (HOSPITAL_COMMUNITY): Payer: Self-pay | Admitting: Internal Medicine

## 2019-11-18 ENCOUNTER — Ambulatory Visit (HOSPITAL_COMMUNITY)
Admission: RE | Admit: 2019-11-18 | Discharge: 2019-11-18 | Disposition: A | Discharge status: Home / Self Care | Payer: BC Managed Care – PPO | Source: Ambulatory Visit | Attending: Internal Medicine | Admitting: Internal Medicine

## 2019-11-18 ENCOUNTER — Other Ambulatory Visit: Payer: Self-pay

## 2019-11-18 VITALS — BP 130/88 | HR 81 | Wt 231.1 lb

## 2019-11-18 DIAGNOSIS — Z87891 Personal history of nicotine dependence: Secondary | ICD-10-CM | POA: Diagnosis not present

## 2019-11-18 DIAGNOSIS — Z79899 Other long term (current) drug therapy: Secondary | ICD-10-CM | POA: Diagnosis not present

## 2019-11-18 DIAGNOSIS — I110 Hypertensive heart disease with heart failure: Secondary | ICD-10-CM | POA: Insufficient documentation

## 2019-11-18 DIAGNOSIS — I5022 Chronic systolic (congestive) heart failure: Secondary | ICD-10-CM | POA: Diagnosis not present

## 2019-11-18 DIAGNOSIS — E220 Acromegaly and pituitary gigantism: Secondary | ICD-10-CM

## 2019-11-18 DIAGNOSIS — I447 Left bundle-branch block, unspecified: Secondary | ICD-10-CM | POA: Diagnosis not present

## 2019-11-18 DIAGNOSIS — Z8249 Family history of ischemic heart disease and other diseases of the circulatory system: Secondary | ICD-10-CM | POA: Insufficient documentation

## 2019-11-18 DIAGNOSIS — D497 Neoplasm of unspecified behavior of endocrine glands and other parts of nervous system: Secondary | ICD-10-CM

## 2019-11-18 DIAGNOSIS — E1165 Type 2 diabetes mellitus with hyperglycemia: Secondary | ICD-10-CM | POA: Diagnosis not present

## 2019-11-18 DIAGNOSIS — I5021 Acute systolic (congestive) heart failure: Secondary | ICD-10-CM | POA: Insufficient documentation

## 2019-11-18 DIAGNOSIS — I252 Old myocardial infarction: Secondary | ICD-10-CM | POA: Insufficient documentation

## 2019-11-18 DIAGNOSIS — I493 Ventricular premature depolarization: Secondary | ICD-10-CM | POA: Diagnosis not present

## 2019-11-18 DIAGNOSIS — Z7984 Long term (current) use of oral hypoglycemic drugs: Secondary | ICD-10-CM | POA: Insufficient documentation

## 2019-11-18 DIAGNOSIS — E669 Obesity, unspecified: Secondary | ICD-10-CM | POA: Diagnosis not present

## 2019-11-18 DIAGNOSIS — I428 Other cardiomyopathies: Secondary | ICD-10-CM | POA: Insufficient documentation

## 2019-11-18 LAB — COMPREHENSIVE METABOLIC PANEL
ALT: 25 U/L (ref 0–44)
AST: 22 U/L (ref 15–41)
Albumin: 3.8 g/dL (ref 3.5–5.0)
Alkaline Phosphatase: 80 U/L (ref 38–126)
Anion gap: 12 (ref 5–15)
BUN: 15 mg/dL (ref 6–20)
CO2: 28 mmol/L (ref 22–32)
Calcium: 10.2 mg/dL (ref 8.9–10.3)
Chloride: 100 mmol/L (ref 98–111)
Creatinine, Ser: 1.28 mg/dL — ABNORMAL HIGH (ref 0.61–1.24)
GFR calc Af Amer: >60 mL/min (ref >60)
GFR calc non Af Amer: >60 mL/min (ref >60)
Glucose, Bld: 323 mg/dL — ABNORMAL HIGH (ref 70–99)
Potassium: 4.9 mmol/L (ref 3.5–5.1)
Sodium: 140 mmol/L (ref 135–145)
Total Bilirubin: 0.8 mg/dL (ref 0.3–1.2)
Total Protein: 7.1 g/dL (ref 6.5–8.1)

## 2019-11-18 LAB — BRAIN NATRIURETIC PEPTIDE: B Natriuretic Peptide: 33.2 pg/mL (ref 0.0–100.0)

## 2019-11-18 MED ORDER — ENTRESTO 49-51 MG PO TABS: 1.0000 | ORAL_TABLET | Freq: Two times a day (BID) | ORAL | 3 refills | Status: DC

## 2019-11-18 NOTE — Progress Notes (Signed)
ADVANCED HF CLINIC NOTE  HPI:  [name] is a 54 year old male with a history of HTN and poorly controlled diabetes who was admitted to [facility] from 7/5-7/20/20 with hypertensive urgency and acute systolic HF. ECG showed LBBB of uncertain duration.  He was admitted to the ICU and placed on IV NTG and underwent diuresis. Echocardiogram showed severe systolic dysfunction with EF 25-30%. Cardiac catheterization on 12/16/18 showed normal coronary arteries with well compensated filling pressures after diuresis.   RA = 4 RV = 34/3 PA = 31/8 (19) PCWP =  11 Fick = 5.1/2.24  He was started on GDMT including carvedilol 6.25 bid, Entresto 49/51 bid, spironolactone 12.5 daily. He did well for several days but then developed severe fatigue. I saw him in Clinic on 12/27/18. He was in bigeminy. I stopped his b-blocker and started amio 200 bid.K 5.0 Mg 1.7.  LifeVest placed.  He returns for f/u with his wife. We saw him last on 10/28/19 and was feeling terrible. Was off amiodarone and Entresto for 5 weeks and PVCs were back. EF low on cMRI. We restarted amiodarone at 200 bid and Entresto 24/26 bid. On spiro 25 and Jardiance. Also diagnosed with growth hormone excess.  Says he feels great again. Best he has fel    cMRI 10/21/19 1. Very difficult study due to frequent ectopy and respiratory artifact. 2. Moderately dilated left ventricle with estimated EF 20-25% (unable to quantify due to respiratory artifact and ectopy). Diffuse hypokinesis with basal to mid inferolateral akinesis. 3.  Normal RV size and systolic function. 4. Delayed enhancement images were also very difficult. However, there was clearly a dense basal to mid inferolateral scar. This looked most consistent with a prior MI in the LCx territory.   Echo 09/03/19: EF 25% RV ok. Personally reviewed  Echo 11/19 EF ~35-40% with severe dyssynchrony due to LBBB and PVCs Personally reviewed  Zio patch 10/20:  4.8%PVCs  SH:  Social History   Socioeconomic History  . Marital status: Married    Spouse name: Not on file  . Number of children: Not on file  . Years of education: Not on file  . Highest education level: Not on file  Occupational History  . Not on file  Tobacco Use  . Smoking status: Former Research scientist (life sciences)  . Smokeless tobacco: Never Used  Substance and Sexual Activity  . Alcohol use: Never  . Drug use: Never  . Sexual activity: Yes  Other Topics Concern  . Not on file  Social History Narrative  . Not on file   Social Determinants of Health   Financial Resource Strain:   . Difficulty of Paying Living Expenses:   Food Insecurity:   . Worried About [name] in the Last Year:   . Arboriculturist in the Last Year:   Transportation Needs:   . Film/video editor (Medical):   Marland Kitchen Lack of Transportation (Non-Medical):   Physical Activity:   . Days of Exercise per Week:   . Minutes of Exercise per Session:   Stress:   . Feeling of Stress :   Social Connections:   . Frequency of Communication with Friends and Family:   . Frequency of Social Gatherings with Friends and Family:   . Attends Religious Services:   . Active Member of Clubs or Organizations:   . Attends Archivist Meetings:   Marland Kitchen Marital Status:   Intimate Partner Violence:   .  Fear of Current or Ex-Partner:   . Emotionally Abused:   Marland Kitchen Physically Abused:   . Sexually Abused:     FH:  Family History  Problem Relation Age of Onset  . Hypertension Mother   . Heart attack Mother     Past Medical History:  Diagnosis Date  . Essential hypertension   . Hypertensive emergency 12/15/2018  . Obesity   . Systolic heart failure ([place])   . Type 2 diabetes mellitus with complication [facility])     Current Outpatient Medications  Medication Sig Dispense Refill  . amiodarone (PACERONE) 200 MG tablet Take 1 tablet (200 mg total) by mouth 2 (two) times daily. 60 tablet 3  . empagliflozin (JARDIANCE) 10  MG TABS tablet Take 10 mg by mouth daily. 30 tablet 2  . glipiZIDE (GLUCOTROL) 10 MG tablet TAKE 1 TABLET BY MOUTH TWICE DAILY BEFORE A MEAL 60 tablet 2  . Loratadine (CLARITIN) 10 MG CAPS Take 10 mg by mouth daily as needed (allergies).    . metFORMIN (GLUCOPHAGE) 1000 MG tablet Take 1,000 mg by mouth 2 (two) times daily with a meal.    . sacubitril-valsartan (ENTRESTO) 24-26 MG Take 1 tablet by mouth 2 (two) times daily. 60 tablet 3  . spironolactone (ALDACTONE) 25 MG tablet Take 25 mg by mouth daily.     No current facility-administered medications for this encounter.    Vitals:   11/18/19 1108  BP: 130/88  Pulse: 81  SpO2: 94%  Weight: 104.8 kg (231 lb 2 oz)   PHYSICAL EXAM: General:  Well appearing. No resp difficulty HEENT: normal Coarse features Neck: supple. no JVD. Carotids 2+ bilat; no bruits. No lymphadenopathy or thryomegaly appreciated. Cor: PMI nondisplaced. Regular rate & rhythm. No rubs, gallops or murmurs. Lungs: clear Abdomen: soft, nontender, nondistended. No hepatosplenomegaly. No bruits or masses. Good bowel sounds. Extremities: no cyanosis, clubbing, rash, edema Neuro: alert & orientedx3, cranial nerves grossly intact. moves all 4 extremities w/o difficulty. Affect pleasant   ECG 05/01/19 : NSR 71 LBBB 143ms frequent PVCs. Personally reviewed  ASSESSMENT & PLAN:  1. Acute systolic HF - diagnosed 4/96 EF ~25-30%. NICM - cath 7/20 with normal coronaries and compensated hemodynamics - suspect he likely has LBBB +/- PVC-induced CM  - Echo 09/03/19 EF ~25% with severe LV dyssynchrony (no improvement with PVC suppression) - Zio patch 10/20 with 4.8% PVC  - cMRI 5/21 EF 20-25% frequent ectopy. RV normal. Inferolateral scar - Feels much better today after restarting amio and HF meds.  - Volume status improved. NYHA II - Increase Entresto to 49/51 bid (his wife is apprehensive about this but I reassured her that BP looks fine) - Continue amio 200 bid - Refuses  all b-blockers as he is worried it will drop his BP again. - Continue spiro 25 - Continue Jardiance - cMRI 5/21 suggestive of PVC/LBBB CM but presence of ? Inferolateral scar is puzzling.  - Unclear if cardiomyopathy related to PVCs, LBBB (or both). EF initially did not get better with PVC suppression. Will refer to EP for consideration of CRT and any thoughts on his PVC regimen. - Lab today  2. Frequent PVCs - Amio restarted at last visit (he self d/c'd by mistake) - zio 10/20 4.8% PVC can repeat as needed - Can consider switch to mexilitene as needed. He refuses to retry b-blockers - home sleep study with AHI 3. (No significant OSA)  3. HTN - Blood pressure up. Increase Entresto  4. LBBB - discussion as  above  5. Poorly controlled DM2 - Per his PCP. HgBA1c was 12.7 in 7/20 - Continue Jardiance - Hgba1c improving  6. Acromegaly - GH level elevated - check brain MRI to look for pituitary tumpr - will refer to Endo (Dr. Renne Crigler)  - suspect this may also play a role in cardiomyopathy  Total time spent 45 minutes. Over half that time spent discussing above.    Glori Bickers, MD  11:40 AM

## 2019-11-18 NOTE — Patient Instructions (Addendum)
INCREASE Entresto to 49/51mg  twice daily  Routine lab work today. Will notify you of abnormal results  Your provider requests you have an MRI of your brain.  Follow up in 3-4 months.

## 2019-11-24 ENCOUNTER — Ambulatory Visit (INDEPENDENT_AMBULATORY_CARE_PROVIDER_SITE_OTHER): Payer: BC Managed Care – PPO | Admitting: Internal Medicine

## 2019-11-24 ENCOUNTER — Other Ambulatory Visit: Payer: Self-pay

## 2019-11-24 ENCOUNTER — Encounter: Payer: Self-pay | Admitting: Internal Medicine

## 2019-11-24 VITALS — BP 138/80 | HR 88 | Ht 76.0 in | Wt 234.0 lb

## 2019-11-24 DIAGNOSIS — E11630 Type 2 diabetes mellitus with periodontal disease: Secondary | ICD-10-CM

## 2019-11-24 DIAGNOSIS — E349 Endocrine disorder, unspecified: Secondary | ICD-10-CM

## 2019-11-24 LAB — CORTISOL: Cortisol, Plasma: 13.6 ug/dL

## 2019-11-24 LAB — T3, FREE: T3, Free: 4.3 pg/mL — ABNORMAL HIGH (ref 2.3–4.2)

## 2019-11-24 LAB — FOLLICLE STIMULATING HORMONE: FSH: 53.9 m[IU]/mL — ABNORMAL HIGH (ref 1.4–18.1)

## 2019-11-24 LAB — LUTEINIZING HORMONE: LH: 13.07 m[IU]/mL — ABNORMAL HIGH (ref 1.50–9.30)

## 2019-11-24 LAB — TSH: TSH: 0.84 u[IU]/mL (ref 0.35–4.50)

## 2019-11-24 LAB — T4, FREE: Free T4: 2 ng/dL — ABNORMAL HIGH (ref 0.60–1.60)

## 2019-11-24 NOTE — Progress Notes (Signed)
Patient ID: Kevin Hampton, male   DOB: 1965/10/21, 54 y.o.   MRN: [id number]   This visit occurred during the SARS-CoV-2 public health emergency.  Safety protocols were in place, including screening questions prior to the visit, additional usage of staff PPE, and extensive cleaning of exam room while observing appropriate contact time as indicated for disinfecting solutions.   HPI  Kevin Hampton is a 54 y.o.-year-old male, referred by his cardiologist, [name], for evaluation of an elevated growth hormone (and suspicion for acromegaly).  Patient is accompanied by his wife who offers part of the history especially regarding his medical history and symptoms.  Pt describes that in late 56s, he noticed a change in his appearance: his jaw and forehead started to protrude.  He also has large fingers but he was quite obese and lost approximately 100 pounds approximately 10 to 15 years ago so he thought this was the cause why his fingers were larger.  He also had problems with increased spacing between his lower incisors. His son noticed a change in appearance and advised the patient to have his growth hormone checked.  Pt. was seen by [name] in 10/2019 and a growth hormone was checked at that time.  This returned elevated, so they pituitary MRI was ordered, but not done yet.  She was referred to endocrinology for investigation for acromegaly.  Pt. describes: - + increase in ring size - + widening of spaces between teeth - + developing of an underbite - + deepening of the voice - no increase in shoe size - no headaches - + Increase sweating - no joint pain, only occasionally in fingers - + occasional constipation - + now only rarely snoring, but had OSA before losing weight - repeated sleep study >> normal - + weight loss - + fatigue, low energy - + cold intolerance only at night, occasionally - no vision loss, no double vision, he prev. wore readers  -not since 03/2019  He also  has: - cardiomegaly with cardiomyopathy and CHF (EF 20 to 25%, per review of his most recent cardiac MRI report) - diabetes type 2 (dx'ed 2012), managed by PCP, improving: Lab Results  Component Value Date   HGBA1C 8.7 (A) 09/10/2019   HGBA1C 10.0 (A) 03/17/2019   HGBA1C 12.7 (H) 12/15/2018  He is on Metformin, Jardiance, glipizide.  I reviewed patient's pertinent labs: 10/28/2019: Growth hormone 27.9 (0-10)  He had slightly high free T4 in the past, but normal TSH: Lab Results  Component Value Date   TSH 0.955 10/28/2019   TSH 0.845 05/01/2019   TSH 0.789 12/15/2018   FREET4 2.26 (H) 10/28/2019   FREET4 1.60 (H) 05/01/2019  Of note, he is on amiodarone.  No previous colonoscopies.  He has a history of nasal polyp surgeries and mentions that he has a lot of scar tissue in his nose.  He lost a significant amount of weight after he moved from [place] [employer] to [place].  He attributes this to his diabetes.  This was not fully intentional.  No FH of DM, pituitary tumors.   He is a Theme park manager.  ROS: Constitutional: + See HPI, + poor sleep Eyes: no blurry vision, no xerophthalmia ENT: no sore throat, no neck nodules, no dysphagia/odynophagia, no hoarseness Cardiovascular: no CP/SOB/palpitations/leg swelling Respiratory: no cough/SOB Gastrointestinal: no N/V/D/ + C/+ heartburn Musculoskeletal: no muscle/+ joint aches Skin: + itching R lateral thigh Neurological: + Occasional tremors/no numbness/tingling/dizziness Psychiatric: no depression/anxiety  Past Medical History:  Diagnosis  Date  . Essential hypertension   . Hypertensive emergency 12/15/2018  . Obesity   . Systolic heart failure ([place])   . Type 2 diabetes mellitus with complication [facility])    Past Surgical History:  Procedure Laterality Date  . RIGHT/LEFT HEART CATH AND CORONARY ANGIOGRAPHY N/A 12/16/2018   Procedure: RIGHT/LEFT HEART CATH AND CORONARY ANGIOGRAPHY;  Surgeon: Lorretta Harp, MD;  Location: [facility];  Service: Cardiovascular;  Laterality: N/A;   Social History   Socioeconomic History  . Marital status: Married    Spouse name: Everlene Farrier  . Number of children: 1  . Years of education: Not on file  . Highest education level: Not on file  Occupational History  . Occupation: Minister/carpenter/musician  Tobacco Use  . Smoking status: Former Research scientist (life sciences)  . Smokeless tobacco: Never Used  Vaping Use  . Vaping Use: Never used  Substance and Sexual Activity  . Alcohol use: Never  . Drug use: Never  . Sexual activity: Yes  Other Topics Concern  . Not on file  Social History Narrative  . Not on file   Social Determinants of Health   Financial Resource Strain:   . Difficulty of Paying Living Expenses:   Food Insecurity:   . Worried About [name] in the Last Year:   . Arboriculturist in the Last Year:   Transportation Needs:   . Film/video editor (Medical):   Marland Kitchen Lack of Transportation (Non-Medical):   Physical Activity:   . Days of Exercise per Week:   . Minutes of Exercise per Session:   Stress:   . Feeling of Stress :   Social Connections:   . Frequency of Communication with Friends and Family:   . Frequency of Social Gatherings with Friends and Family:   . Attends Religious Services:   . Active Member of Clubs or Organizations:   . Attends Archivist Meetings:   Marland Kitchen Marital Status:   Intimate Partner Violence:   . Fear of Current or Ex-Partner:   . Emotionally Abused:   Marland Kitchen Physically Abused:   . Sexually Abused:    Current Outpatient Medications on File Prior to Visit  Medication Sig Dispense Refill  . amiodarone (PACERONE) 200 MG tablet Take 1 tablet (200 mg total) by mouth 2 (two) times daily. 60 tablet 3  . empagliflozin (JARDIANCE) 10 MG TABS tablet Take 10 mg by mouth daily. 30 tablet 2  . glipiZIDE (GLUCOTROL) 10 MG tablet TAKE 1 TABLET BY MOUTH TWICE DAILY BEFORE A MEAL 60 tablet 2  . Loratadine (CLARITIN) 10 MG CAPS Take 10 mg by mouth  daily as needed (allergies).    . metFORMIN (GLUCOPHAGE) 1000 MG tablet Take 1,000 mg by mouth 2 (two) times daily with a meal.    . sacubitril-valsartan (ENTRESTO) 49-51 MG Take 1 tablet by mouth 2 (two) times daily. 60 tablet 3  . spironolactone (ALDACTONE) 25 MG tablet Take 25 mg by mouth daily.     No current facility-administered medications on file prior to visit.   Allergies  Allergen Reactions  . Coreg [Carvedilol] Other (See Comments)    Brings heart rate and blood pressure down severly   Family History  Problem Relation Age of Onset  . Hypertension Mother   . Heart attack Mother   . Hypertension Father   . Hypertension Sister   . Hypertension Brother     PE: BP 138/80   Pulse 88   Ht 6\' 4"  (1.93  m)   Wt 234 lb (106.1 kg)   SpO2 96%   BMI 28.48 kg/m  Wt Readings from Last 3 Encounters:  11/24/19 234 lb (106.1 kg)  11/18/19 231 lb 2 oz (104.8 kg)  10/28/19 232 lb 9.6 oz (105.5 kg)   Constitutional: overweight, in NAD, + deep voice, + frontal bossing, + enlarged jaw with widely spaced incisors, + enlarged lower lip, + prominent nasolabial folds Eyes: PERRLA, EOMI, no exophthalmos ENT: moist mucous membranes, no thyromegaly, no cervical lymphadenopathy Cardiovascular: RRR, No MRG Respiratory: CTA B Gastrointestinal: abdomen soft, NT, ND, BS+ Musculoskeletal: + deformities (enlarged jaw, fingers, forehead), strength intact in all 4 Skin: moist, warm, no rashes, + skin tags on neck Neurological: no tremor with outstretched hands, DTR normal in all 4, + visual fields intact by confrontation  ASSESSMENT: 1.  Elevated growth hormone  PLAN:  1. Patient with an approximately 20 year h/o facial feature changes in the recent high growth hormone.  An MRI was ordered but not obtained yet. - Patient describes that he started to notice a change in his appearance in his 31s.  He shows me a picture on his wedding day and there is absolutely no resemblance between the person in  the picture and the person in front of me!  He has almost all physical features of acromegaly (see HPI and physical exam).  Also, he has cardiomegaly, which can be a feature of acromegaly.  He also has diabetes and is the only family member with this condition.  My suspicion for acromegaly for him is very high. - we discussed that growth hormone is usually produced in spurts and fluctuates throughout the day.  Therefore, definitive treatment for checking for acromegaly is by taking an IGF-I and if this is elevated, we may even need a growth hormone suppression test (during an OGTT, oral glucose tolerance test).  After these results return, we will go ahead with the pituitary MRI. - we discussed about etiology, manifestations, and complications of the disease - we also discussed about possible treatments, in case a pituitary tumor is found, usually consisting of surgery.  I explained that this is usually a benign tumor, but sometimes further treatments are needed to control/cure the disease.  He is questioning if any medication treatment would be possible and I explained that first-line for treatment is surgery, and then, if the cure is not complete, he may need medical treatment: Somatostatin, Pegvisomant, or sometimes gamma knife sx. - I explained further work-up to include IGF-I and prolactin (since growth hormone tumors may come produce prolactin), along with the rest of the pituitary hormones - I ordered the following labs (he is fasting today - labs draw at 9 am): Orders Placed This Encounter  Procedures  . Luteinizing hormone  . Follicle stimulating hormone  . Growth hormone  . Insulin-like growth factor  . Testosterone Free with SHBG  . Prolactin  . TSH  . T4, free  . T3, free  . Cortisol  . ACTH  - we discussed that if he does end up having a pituitary tumor, t there are 3 types of pituitary surgeries:  Transsphenoidal nasal (most commonly used, however, since he has scar tissue from  previous sinus surgery, this may prove to be difficult)  Transsphenoidal sublabial - under upper lip  Open craniotomy (not applicable for her for such a small tumor) - We discussed about what surgery consists of, hospitalization, recovery time, possible complications - I recommended [facility] for surgery  and further treatment -he and his wife agree with the plan. Return in 3 months.  - Total time spent for the visit: 1 hour, in obtaining medical information from the chart and from patient and his wife, reviewing his  previous labs, imaging evaluations, and treatments, reviewing his symptoms, counseling him about his condition (please see the discussed topics above), and developing a plan to further investigate and treat it; he and his wife had a number of questions which I addressed.  Component     Latest Ref Rng & Units 11/24/2019  IGF-I, LC/MS     50 - 317 ng/mL 1,282 (H)  Z-Score (Male)     -2.0 - 2 SD 6.0 (H)  TSH     0.35 - 4.50 uIU/mL 0.84  T4,Free(Direct)     0.60 - 1.60 ng/dL 2.00 (H)  Triiodothyronine,Free,Serum     2.3 - 4.2 pg/mL 4.3 (H)  Growth Hormone     < OR = 7.1 ng/mL 48.9 (H)  LH     1.50 - 9.30 mIU/mL 13.07 (H)  FSH     1.4 - 18.1 mIU/ML 53.9 (H)  Prolactin     2.0 - 18.0 ng/mL 14.3  Cortisol, Plasma     ug/dL 13.6  C206 ACTH     6 - 50 pg/mL 60 (H)  Testosterone, total     264.0 - 916.0 ng/dL 341.9   High LH, FSH, ACTH with normal cortisol and testosterone. Very high IGF-I and growth hormone. I do not feel we absolutely need a growth hormone suppression test for now since the suspicion for acromegaly is so high. I will order a pituitary MRI.  Philemon Kingdom, MD PhD [facility] Endocrinology

## 2019-11-24 NOTE — Patient Instructions (Addendum)
Please stop at the lab.  We will schedule a new appt in 3 months.   Acromegaly Acromegaly is a hormone disorder that causes unusual growth of the body in adults. It commonly affects the bones of the hands, feet, and face. Over time, acromegaly can lead to a number of problems or other conditions, such as:  Diabetes mellitus.  Thyroid problems.  Colon polyps.  Osteoarthritis.  Heart disease.  Sleep apnea.  High blood pressure (hypertension). What are the causes? This condition can be caused when the body produces more growth hormone than it needs. This can result from:  A non-cancerous (benign) tumor on the gland that produces growth hormone (pituitary gland). This is a common cause.  A tumor in another part of the body, such as the adrenal gland or lungs. This is a rare cause. What are the signs or symptoms? Common signs and symptoms of this condition include:  Larger-than-normal hands, feet, head, face, brow, or jaw.  Swelling of the hands or feet.  Headaches.  Fatigue or weakness.  Enlarged heart, liver, kidneys, spleen, or other internal organs.  Severe problems with snoring or sleep apnea.  Blurred vision.  Joint pain.  Skin changes, such as skin tags or coarse skin.  Widely spaced teeth.  Numbness or tingling in the fingers.  Menstrual cycle changes in women.  Erectile dysfunction in men. How is this diagnosed? This condition may be diagnosed based on:  A physical exam.  Blood tests.  Imaging tests, such as an MRI or a CT scan. How is this treated? This condition may be treated with:  Surgery to remove the tumor that is causing the condition.  Medicines. Medicines may be given instead of surgery, or they may be given before surgery to shrink the tumor. They may also be given after surgery: ? If the surgery is not successful. ? If hormone levels do not return to normal.  Radiation therapy. This is used if surgery and medicines are not  effective. Follow these instructions at home:  Keep all follow-up visits as told by your health care provider. It is important to keep follow-up appointments all through your life so that your health care provider can: ? Check whether your pituitary gland is working normally. ? Look for complications of the disease.  Get eye exams regularly.  Take over-the-counter and prescription medicines only as told by your health care provider. Contact a health care provider if:  You have changes in your vision.  Your headaches get worse.  You have new symptoms or new pain or numbness.  Your symptoms do not get better with treatment. Get help right away if:  You have pain or pressure in your chest.  You have difficulty breathing.  You pass out or feel faint. Summary  Acromegaly is a hormone disorder that causes unusual growth of the body in adults. It commonly affects the bones of the hands, feet, and face.  Over time, acromegaly can lead to a number of problems or other conditions.  This condition can be caused when the body produces more growth hormone than it needs.  Acromegaly is most commonly caused by a non-cancerous (benign) tumor on the gland that produces growth hormone (pituitary gland).  This condition may be treated with surgery to remove a tumor, medicines, or radiation. This information is not intended to replace advice given to you by your health care provider. Make sure you discuss any questions you have with your health care provider. Document Revised: 05/11/2017 Document  Reviewed: 01/11/2017 Elsevier Patient Education  [employer].

## 2019-11-26 LAB — TESTOSTERONE, TOTAL, LC/MS/MS: Testosterone, total: 341.9 ng/dL (ref 264.0–916.0)

## 2019-11-30 LAB — GROWTH HORMONE: Growth Hormone: 48.9 ng/mL — ABNORMAL HIGH (ref <=7.1)

## 2019-11-30 LAB — INSULIN-LIKE GROWTH FACTOR
IGF-I, LC/MS: 1282 ng/mL — ABNORMAL HIGH (ref 50–317)
Z-Score (Male): 6 SD — ABNORMAL HIGH (ref ?–2.0)

## 2019-11-30 LAB — ACTH: C206 ACTH: 60 pg/mL — ABNORMAL HIGH (ref 6–50)

## 2019-11-30 LAB — PROLACTIN: Prolactin: 14.3 ng/mL (ref 2.0–18.0)

## 2019-12-01 ENCOUNTER — Encounter: Payer: Self-pay | Admitting: Internal Medicine

## 2019-12-01 ENCOUNTER — Other Ambulatory Visit: Payer: Self-pay

## 2019-12-01 ENCOUNTER — Ambulatory Visit: Payer: BC Managed Care – PPO | Admitting: Internal Medicine

## 2019-12-01 VITALS — BP 142/90 | HR 88 | Ht 76.0 in | Wt 231.4 lb

## 2019-12-01 DIAGNOSIS — I5022 Chronic systolic (congestive) heart failure: Secondary | ICD-10-CM

## 2019-12-01 DIAGNOSIS — I447 Left bundle-branch block, unspecified: Secondary | ICD-10-CM

## 2019-12-01 DIAGNOSIS — I493 Ventricular premature depolarization: Secondary | ICD-10-CM | POA: Diagnosis not present

## 2019-12-01 NOTE — Patient Instructions (Signed)

## 2019-12-01 NOTE — Progress Notes (Signed)
ELECTROPHYSIOLOGY CONSULT NOTE  Patient ID: Kevin Hampton, MRN: [id number], DOB/AGE: 56-Jun-1967 56 y.o. Admit date: (Not on file) Date of Consult: 12/01/2019  Primary Physician: Elsie Stain, MD Primary Cardiologist:DB     Kevin Hampton is a 54 y.o. male who is being seen today for the evaluation of ICD at the request of DB.    HPI Kevin Hampton is a 54 y.o. male  referred for consideration of an ICD.  He has a history of left bundle branch block of unknown duration.  History of PVCs for which he is treated with amiodarone.  Beta-blockers have caused hypotension.  Averse.  Some miscommunication with the heart failure clinic had resulted in him stopping his Entresto for period of time.  He felt terrible.  Feels much better on it.  Denies shortness of breath.  No peripheral edema.  Nocturnal dyspnea or orthopnea.  No chest pain.   DATE TEST EF   11/19 Echo   35-40% %   7/20 LHC  Normal Coronarys  3/21 Echo   25% %   5/21 cMRI 20-25% Difficult study2/ artifact   Date Cr K TSH LFTs Hgb  6/21 1.28 4.9 0.84 25 17.5   Date PVCs    10/20 4.8%          Past Medical History:  Diagnosis Date  . Essential hypertension   . Hypertensive emergency 12/15/2018  . Obesity   . Systolic heart failure ([place])   . Type 2 diabetes mellitus with complication [facility])       Surgical History:  Past Surgical History:  Procedure Laterality Date  . RIGHT/LEFT HEART CATH AND CORONARY ANGIOGRAPHY N/A 12/16/2018   Procedure: RIGHT/LEFT HEART CATH AND CORONARY ANGIOGRAPHY;  Surgeon: Lorretta Harp, MD;  Location: [facility];  Service: Cardiovascular;  Laterality: N/A;     Home Meds: Current Meds  Medication Sig  . amiodarone (PACERONE) 200 MG tablet Take 1 tablet (200 mg total) by mouth 2 (two) times daily.  . empagliflozin (JARDIANCE) 10 MG TABS tablet Take 10 mg by mouth daily.  Marland Kitchen glipiZIDE (GLUCOTROL) 10 MG tablet TAKE 1 TABLET BY MOUTH TWICE DAILY BEFORE A MEAL  .  Loratadine (CLARITIN) 10 MG CAPS Take 10 mg by mouth daily as needed (allergies).  . metFORMIN (GLUCOPHAGE) 1000 MG tablet Take 1,000 mg by mouth 2 (two) times daily with a meal.  . sacubitril-valsartan (ENTRESTO) 24-26 MG Take 1 tablet by mouth 2 (two) times daily.  Marland Kitchen spironolactone (ALDACTONE) 25 MG tablet Take 25 mg by mouth daily.    Allergies:  Allergies  Allergen Reactions  . Coreg [Carvedilol] Other (See Comments)    Brings heart rate and blood pressure down severly    Social History   Socioeconomic History  . Marital status: Married    Spouse name: Everlene Farrier  . Number of children: 1  . Years of education: Not on file  . Highest education level: Not on file  Occupational History  . Occupation: Minister/carpenter/musician  Tobacco Use  . Smoking status: Former Research scientist (life sciences)  . Smokeless tobacco: Never Used  Vaping Use  . Vaping Use: Never used  Substance and Sexual Activity  . Alcohol use: Never  . Drug use: Never  . Sexual activity: Yes  Other Topics Concern  . Not on file  Social History Narrative  . Not on file   Social Determinants of Health   Financial Resource Strain:   . Difficulty of Paying Living Expenses:  Food Insecurity:   . Worried About [name] in the Last Year:   . Arboriculturist in the Last Year:   Transportation Needs:   . Film/video editor (Medical):   Marland Kitchen Lack of Transportation (Non-Medical):   Physical Activity:   . Days of Exercise per Week:   . Minutes of Exercise per Session:   Stress:   . Feeling of Stress :   Social Connections:   . Frequency of Communication with Friends and Family:   . Frequency of Social Gatherings with Friends and Family:   . Attends Religious Services:   . Active Member of Clubs or Organizations:   . Attends Archivist Meetings:   Marland Kitchen Marital Status:   Intimate Partner Violence:   . Fear of Current or Ex-Partner:   . Emotionally Abused:   Marland Kitchen Physically Abused:   . Sexually Abused:       Family History  Problem Relation Age of Onset  . Hypertension Mother   . Heart attack Mother   . Hypertension Father   . Hypertension Sister   . Hypertension Brother      ROS:  Please see the history of present illness.      All other systems reviewed and negative.    Physical Exam  Blood pressure (!) 142/90, pulse 88, height 6\' 4"  (1.93 m), weight 231 lb 6.4 oz (105 kg), SpO2 97 %. General: Well developed, well nourished male in no acute distress. Head: Normocephalic, atraumatic, sclera non-icteric, no xanthomas, nares are without discharge. EENT: normal  Lymph Nodes:  none Neck: Negative for carotid bruits. JVD not elevated. Back:without scoliosis kyphosis  Lungs: Clear bilaterally to auscultation without wheezes, rales, or rhonchi. Breathing is unlabored. Heart: RRR with S1 S2. No   murmur . No rubs, or gallops appreciated. Abdomen: Soft, non-tender, non-distended with normoactive bowel sounds. No hepatomegaly. No rebound/guarding. No obvious abdominal masses. Msk:  Strength and tone appear normal for age. Extremities: No clubbing or cyanosis. No edema.  Distal pedal pulses are 2+ and equal bilaterally. Skin: Warm and Dry Neuro: Alert and oriented X 3. CN III-XII intact Grossly normal sensory and motor function . Psych:  Responds to questions appropriately with a normal affect.      Labs: Cardiac Enzymes No results for input(s): CKTOTAL, CKMB, TROPONINI in the last 72 hours. CBC Lab Results  Component Value Date   WBC 5.6 10/28/2019   HGB 17.5 (H) 10/28/2019   HCT 51.9 10/28/2019   MCV 91.9 10/28/2019   PLT 189 10/28/2019   PROTIME: No results for input(s): LABPROT, INR in the last 72 hours. Chemistry No results for input(s): NA, K, CL, CO2, BUN, CREATININE, CALCIUM, PROT, BILITOT, ALKPHOS, ALT, AST, GLUCOSE in the last 168 hours.  Invalid input(s): LABALBU Lipids Lab Results  Component Value Date   CHOL 192 12/15/2018   HDL 52 12/15/2018   LDLCALC 81  12/15/2018   TRIG 293 (H) 12/15/2018   BNP No results found for: PROBNP Thyroid Function Tests: No results for input(s): TSH, T4TOTAL, T3FREE, THYROIDAB in the last 72 hours.  Invalid input(s): FREET3 Miscellaneous No results found for: DDIMER  Radiology/Studies:  No results found.  EKG: Sinus at 88 Intervals 18/15/45 PVCs right bundle right axis in a pattern of bigeminy  ECG 6/21 no ventricular ectopy 5/21 25% PVCs Assessment and Plan:  Cardiomyopathy-nonischemic  PVCs-frequent right bundle right axis  Left bundle branch block  Congestive heart failure-chronic-systolic   The patient is functionally  doing exceedingly well at this point.  He describes no limitations.  We will need to discuss with Dr. Reine Just any heart failure limitations as class I symptoms would identify him as being exceedingly low risk for cardiac arrest.  Hence, would not need an ICD.  Furthermore, in the absence of more significant symptoms, there will be no role for resynchronization either.  These devices could be held in abeyance  His heart rate is fast.  I wonder whether Corlanor would be of some benefit; however, his symptoms again are minimal.  Will defer this to Dr. Reine Just.  Interestingly his PVCs are really variable.  His ECG today had bigeminy.  Palpation for more than a minute had not 1 PVC.  At this juncture would continue the amiodarone with repeat monitoring and then down titration in a couple of months.  Given the high dose, would be inclined to decrease the dose now with reassessment  Virl Axe

## 2019-12-05 ENCOUNTER — Ambulatory Visit: Payer: BC Managed Care – PPO | Attending: Nurse Practitioner | Admitting: Nurse Practitioner

## 2019-12-05 ENCOUNTER — Other Ambulatory Visit: Payer: Self-pay

## 2019-12-05 ENCOUNTER — Encounter: Payer: Self-pay | Admitting: Nurse Practitioner

## 2019-12-05 VITALS — BP 152/94 | HR 82 | Temp 98.1°F | Ht 76.0 in | Wt 235.0 lb

## 2019-12-05 DIAGNOSIS — E11630 Type 2 diabetes mellitus with periodontal disease: Secondary | ICD-10-CM | POA: Diagnosis not present

## 2019-12-05 DIAGNOSIS — D582 Other hemoglobinopathies: Secondary | ICD-10-CM | POA: Diagnosis not present

## 2019-12-05 DIAGNOSIS — E785 Hyperlipidemia, unspecified: Secondary | ICD-10-CM | POA: Diagnosis not present

## 2019-12-05 LAB — POCT GLYCOSYLATED HEMOGLOBIN (HGB A1C): Hemoglobin A1C: 11.8 % — AB (ref 4.0–5.6)

## 2019-12-05 LAB — GLUCOSE, POCT (MANUAL RESULT ENTRY): POC Glucose: 458 mg/dl — AB (ref 70–99)

## 2019-12-05 MED ORDER — CANAGLIFLOZIN 300 MG PO TABS: 300.0000 mg | ORAL_TABLET | Freq: Every day | ORAL | 1 refills | Status: DC

## 2019-12-05 MED ORDER — ATORVASTATIN CALCIUM 40 MG PO TABS: 40.0000 mg | ORAL_TABLET | Freq: Every day | ORAL | 3 refills | Status: DC

## 2019-12-05 MED ORDER — GLIPIZIDE 10 MG PO TABS: 10.0000 mg | ORAL_TABLET | Freq: Two times a day (BID) | ORAL | 1 refills | Status: DC

## 2019-12-05 MED ORDER — METFORMIN HCL 1000 MG PO TABS: 1000.0000 mg | ORAL_TABLET | Freq: Two times a day (BID) | ORAL | 1 refills | Status: DC

## 2019-12-05 NOTE — Progress Notes (Signed)
Assessment & Plan:  Diagnoses and all orders for this visit:  Type 2 diabetes mellitus with periodontal disease, without long-term current use of insulin (HCC) -     Glucose (CBG) -     HgB A1c -     canagliflozin (INVOKANA) 300 MG TABS tablet; Take 1 tablet (300 mg total) by mouth daily before breakfast. -     glipiZIDE (GLUCOTROL) 10 MG tablet; Take 1 tablet (10 mg total) by mouth 2 (two) times daily before a meal. -     metFORMIN (GLUCOPHAGE) 1000 MG tablet; Take 1 tablet (1,000 mg total) by mouth 2 (two) times daily with a meal. Continue blood sugar control as discussed in office today, low carbohydrate diet, and regular physical exercise as tolerated, 150 minutes per week (30 min each day, 5 days per week, or 50 min 3 days per week). Keep blood sugar logs with fasting goal of 90-130 mg/dl, post prandial (after you eat) less than 180.  For Hypoglycemia: BS <60 and Hyperglycemia BS >400; contact the clinic ASAP. Annual eye exams and foot exams are recommended.   Dyslipidemia, goal LDL below 70 -     atorvastatin (LIPITOR) 40 MG tablet; Take 1 tablet (40 mg total) by mouth daily. -     Lipid panel INSTRUCTIONS: Work on a low fat, heart healthy diet and participate in regular aerobic exercise program by working out at least 150 minutes per week; 5 days a week-30 minutes per day. Avoid red meat/beef/steak,  fried foods. junk foods, sodas, sugary drinks, unhealthy snacking, alcohol and smoking.  Drink at least 80 oz of water per day and monitor your carbohydrate intake daily.    Elevated hemoglobin (HCC) -     CBC    Patient has been counseled on age-appropriate routine health concerns for screening and prevention. These are reviewed and up-to-date. Referrals have been placed accordingly. Immunizations are up-to-date or declined.    Subjective:  No chief complaint on file.  HPI Kevin Hampton 54 y.o. male presents to office today for follow up.  has a past medical history of  Essential hypertension, Hypertensive emergency (12/15/2018), Obesity, Systolic heart failure ([place]), and Type 2 diabetes mellitus with complication ([place]).   He was referred to Endo by his cardiologist for possible acromegaly.  He is currently being tested for this. Seeing Cardiology for CHF. Takes Amiodarone for PVCs.     Essential Hypertension Blood pressure not well controlled here in office today. He states home readings are averaging:  120-130/60-70s. Endorses adherence taking amiodarone 200 mg BID, entresto 24-26 mg BID and spironolactone 25 mg daily. Denies chest pain, shortness of breath, palpitations, lightheadedness, dizziness, headaches or BLE edema.  BP Readings from Last 3 Encounters:  12/05/19 (!) 152/94  12/01/19 (!) 142/90  11/24/19 138/80     DM TYPE 2 Poorly controlled. We had a lengthy discussion today regarding the need for tight control of his diabetes. Goal is <7. He admits to dietary non adherence. Had [employer] for breakfast this morning.  I recommended insulin today or injectables. He declines both. Currently taking metformin 1000 mg BID, 10 mg BID, glipizide 10 mg BID and Jardiance 10 mg daily. Will switch to jardiance 300 mg daily. Insurance no longer covers jardiance. Blood glucose levels at home 170-190s. LDL  Is not at goal. He endorses medication adherence taking atorvastatin 40 mg daily. Lab Results  Component Value Date   HGBA1C 11.8 (A) 12/05/2019   Lab Results  Component Value Date  HGBA1C 8.7 (A) 09/10/2019   Lab Results  Component Value Date   LDLCALC 111 (H) 12/05/2019    Review of Systems  Constitutional: Negative for fever, malaise/fatigue and weight loss.  HENT: Negative.  Negative for nosebleeds.   Eyes: Negative.  Negative for blurred vision, double vision and photophobia.  Respiratory: Negative.  Negative for cough and shortness of breath.   Cardiovascular: Negative.  Negative for chest pain, palpitations and leg swelling.    Gastrointestinal: Negative.  Negative for heartburn, nausea and vomiting.  Musculoskeletal: Negative.  Negative for myalgias.  Neurological: Negative.  Negative for dizziness, focal weakness, seizures and headaches.  Psychiatric/Behavioral: Negative.  Negative for suicidal ideas.    Past Medical History:  Diagnosis Date   Essential hypertension    Hypertensive emergency 12/15/2018   Obesity    Systolic heart failure ([place])    Type 2 diabetes mellitus with complication [facility])     Past Surgical History:  Procedure Laterality Date   RIGHT/LEFT HEART CATH AND CORONARY ANGIOGRAPHY N/A 12/16/2018   Procedure: RIGHT/LEFT HEART CATH AND CORONARY ANGIOGRAPHY;  Surgeon: Lorretta Harp, MD;  Location: [facility];  Service: Cardiovascular;  Laterality: N/A;    Family History  Problem Relation Age of Onset   Hypertension Mother    Heart attack Mother    Hypertension Father    Hypertension Sister    Hypertension Brother     Social History Reviewed with no changes to be made today.   Outpatient Medications Prior to Visit  Medication Sig Dispense Refill   amiodarone (PACERONE) 200 MG tablet Take 1 tablet (200 mg total) by mouth 2 (two) times daily. 60 tablet 3   Loratadine (CLARITIN) 10 MG CAPS Take 10 mg by mouth daily as needed (allergies).     sacubitril-valsartan (ENTRESTO) 24-26 MG Take 1 tablet by mouth 2 (two) times daily.     spironolactone (ALDACTONE) 25 MG tablet Take 25 mg by mouth daily.     empagliflozin (JARDIANCE) 10 MG TABS tablet Take 10 mg by mouth daily. 30 tablet 2   glipiZIDE (GLUCOTROL) 10 MG tablet TAKE 1 TABLET BY MOUTH TWICE DAILY BEFORE A MEAL 60 tablet 2   metFORMIN (GLUCOPHAGE) 1000 MG tablet Take 1,000 mg by mouth 2 (two) times daily with a meal.     No facility-administered medications prior to visit.    Allergies  Allergen Reactions   Coreg [Carvedilol] Other (See Comments)    Brings heart rate and blood pressure down severly        Objective:    BP (!) 152/94 (BP Location: Right Arm, Patient Position: Sitting, Cuff Size: Normal)    Pulse 82    Temp 98.1 F (36.7 C) (Temporal)    Ht 6\' 4"  (1.93 m)    Wt 235 lb (106.6 kg)    SpO2 100%    BMI 28.61 kg/m  Wt Readings from Last 3 Encounters:  12/05/19 235 lb (106.6 kg)  12/01/19 231 lb 6.4 oz (105 kg)  11/24/19 234 lb (106.1 kg)    Physical Exam Vitals and nursing note reviewed.  Constitutional:      Appearance: He is well-developed.  HENT:     Head: Normocephalic and atraumatic.  Cardiovascular:     Rate and Rhythm: Normal rate and regular rhythm.     Heart sounds: Normal heart sounds. No murmur heard.  No friction rub. No gallop.   Pulmonary:     Effort: Pulmonary effort is normal. No tachypnea or respiratory distress.  Breath sounds: Normal breath sounds. No decreased breath sounds, wheezing, rhonchi or rales.  Chest:     Chest wall: No tenderness.  Abdominal:     General: Bowel sounds are normal.     Palpations: Abdomen is soft.  Musculoskeletal:        General: Normal range of motion.     Cervical back: Normal range of motion.  Skin:    General: Skin is warm and dry.  Neurological:     Mental Status: He is alert and oriented to person, place, and time.     Coordination: Coordination normal.  Psychiatric:        Behavior: Behavior normal. Behavior is cooperative.        Thought Content: Thought content normal.        Judgment: Judgment normal.          Patient has been counseled extensively about nutrition and exercise as well as the importance of adherence with medications and regular follow-up. The patient was given clear instructions to go to ER or return to medical center if symptoms don't improve, worsen or new problems develop. The patient verbalized understanding.   Follow-up: Return in about 3 months (around 03/06/2020).   Gildardo Pounds, FNP-BC [facility], [place]    12/06/2019, 6:59 PM

## 2019-12-06 ENCOUNTER — Encounter: Payer: Self-pay | Admitting: Nurse Practitioner

## 2019-12-06 LAB — CBC
Hematocrit: 51.1 % — ABNORMAL HIGH (ref 37.5–51.0)
Hemoglobin: 16.6 g/dL (ref 13.0–17.7)
MCH: 30.8 pg (ref 26.6–33.0)
MCHC: 32.5 g/dL (ref 31.5–35.7)
MCV: 95 fL (ref 79–97)
Platelets: 177 10*3/uL (ref 150–450)
RBC: 5.39 x10E6/uL (ref 4.14–5.80)
RDW: 12.4 % (ref 11.6–15.4)
WBC: 6 10*3/uL (ref 3.4–10.8)

## 2019-12-06 LAB — LIPID PANEL
Chol/HDL Ratio: 4.2 ratio (ref 0.0–5.0)
Cholesterol, Total: 212 mg/dL — ABNORMAL HIGH (ref 100–199)
HDL: 51 mg/dL (ref >39)
LDL Chol Calc (NIH): 111 mg/dL — ABNORMAL HIGH (ref 0–99)
Triglycerides: 292 mg/dL — ABNORMAL HIGH (ref 0–149)
VLDL Cholesterol Cal: 50 mg/dL — ABNORMAL HIGH (ref 5–40)

## 2019-12-31 ENCOUNTER — Encounter: Payer: Self-pay | Admitting: Internal Medicine

## 2020-01-06 ENCOUNTER — Other Ambulatory Visit: Payer: Self-pay | Admitting: Internal Medicine

## 2020-01-09 ENCOUNTER — Ambulatory Visit
Admission: RE | Admit: 2020-01-09 | Discharge: 2020-01-09 | Disposition: A | Discharge status: Home / Self Care | Payer: BC Managed Care – PPO | Source: Ambulatory Visit | Attending: Internal Medicine | Admitting: Internal Medicine

## 2020-01-09 ENCOUNTER — Telehealth: Payer: Self-pay

## 2020-01-09 ENCOUNTER — Other Ambulatory Visit: Payer: Self-pay | Admitting: Endocrinology

## 2020-01-09 ENCOUNTER — Other Ambulatory Visit: Payer: Self-pay

## 2020-01-09 DIAGNOSIS — J3489 Other specified disorders of nose and nasal sinuses: Secondary | ICD-10-CM | POA: Diagnosis not present

## 2020-01-09 DIAGNOSIS — G9389 Other specified disorders of brain: Secondary | ICD-10-CM | POA: Diagnosis not present

## 2020-01-09 DIAGNOSIS — D352 Benign neoplasm of pituitary gland: Secondary | ICD-10-CM | POA: Diagnosis not present

## 2020-01-09 DIAGNOSIS — J0120 Acute ethmoidal sinusitis, unspecified: Secondary | ICD-10-CM | POA: Diagnosis not present

## 2020-01-09 DIAGNOSIS — E349 Endocrine disorder, unspecified: Secondary | ICD-10-CM

## 2020-01-09 MED ORDER — GADOBENATE DIMEGLUMINE 529 MG/ML IV SOLN
10.0000 mL | Freq: Once | INTRAVENOUS | Status: AC | PRN
  Administered 2020-01-09: 10 mL via INTRAVENOUS

## 2020-01-09 NOTE — Telephone Encounter (Signed)
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 Mild splaying of the pre-chiasmatic optic nerves.  Sella floor defect with extension of tumor into the right sphenoid sinus.  Bilateral cavernous sinus involvement without significant encasement or luminal narrowing of the intracavernous ICAs.  Nonspecific supratentorial white matter T2 hyperintensities. Differential includes minimal chronic microvascular ischemic changes versus post infectious/inflammatory sequela.  Osseous prominence of the bilateral supraorbital margin, anterior maxillary walls and mandibular condyles likely reflects sequela of acromegaly.  These results will be called to the ordering clinician or representative by the Radiologist Assistant, and communication documented in the PACS or [employer].

## 2020-01-09 NOTE — Telephone Encounter (Signed)
Cheryl from radiology called to report findings from the patients MRI that was done today-please review and advise on this in Dr. Ena Dawley absence

## 2020-01-12 ENCOUNTER — Other Ambulatory Visit: Payer: Self-pay | Admitting: Critical Care Medicine

## 2020-01-12 NOTE — Telephone Encounter (Signed)
Requested medication (s) are due for refill today: yes  Requested medication (s) are on the active medication list: yes  Last refill:  last refilled by historical provider  Future visit scheduled: yes  Notes to clinic:  Please review for refill. Medication last filled by historical provider    Requested Prescriptions  Pending Prescriptions Disp Refills   spironolactone (ALDACTONE) 25 MG tablet [Pharmacy Med Name: Spironolactone 25 MG Oral Tablet] 60 tablet 0    Sig: Take 1 tablet by mouth once daily      There is no refill protocol information for this order

## 2020-01-23 DIAGNOSIS — Z6828 Body mass index (BMI) 28.0-28.9, adult: Secondary | ICD-10-CM | POA: Diagnosis not present

## 2020-01-23 DIAGNOSIS — D497 Neoplasm of unspecified behavior of endocrine glands and other parts of nervous system: Secondary | ICD-10-CM | POA: Diagnosis not present

## 2020-01-23 DIAGNOSIS — I10 Essential (primary) hypertension: Secondary | ICD-10-CM | POA: Diagnosis not present

## 2020-02-18 ENCOUNTER — Other Ambulatory Visit: Payer: Self-pay

## 2020-02-18 ENCOUNTER — Ambulatory Visit (HOSPITAL_COMMUNITY)
Admission: RE | Admit: 2020-02-18 | Discharge: 2020-02-18 | Disposition: A | Discharge status: Home / Self Care | Payer: BC Managed Care – PPO | Source: Ambulatory Visit | Attending: Internal Medicine | Admitting: Internal Medicine

## 2020-02-18 VITALS — BP 135/90 | HR 83 | Wt 232.0 lb

## 2020-02-18 DIAGNOSIS — Z79899 Other long term (current) drug therapy: Secondary | ICD-10-CM | POA: Insufficient documentation

## 2020-02-18 DIAGNOSIS — E669 Obesity, unspecified: Secondary | ICD-10-CM | POA: Insufficient documentation

## 2020-02-18 DIAGNOSIS — Z8249 Family history of ischemic heart disease and other diseases of the circulatory system: Secondary | ICD-10-CM | POA: Insufficient documentation

## 2020-02-18 DIAGNOSIS — Z87891 Personal history of nicotine dependence: Secondary | ICD-10-CM | POA: Diagnosis not present

## 2020-02-18 DIAGNOSIS — I447 Left bundle-branch block, unspecified: Secondary | ICD-10-CM | POA: Diagnosis not present

## 2020-02-18 DIAGNOSIS — I493 Ventricular premature depolarization: Secondary | ICD-10-CM

## 2020-02-18 DIAGNOSIS — E220 Acromegaly and pituitary gigantism: Secondary | ICD-10-CM | POA: Diagnosis not present

## 2020-02-18 DIAGNOSIS — E1165 Type 2 diabetes mellitus with hyperglycemia: Secondary | ICD-10-CM | POA: Diagnosis not present

## 2020-02-18 DIAGNOSIS — Z6828 Body mass index (BMI) 28.0-28.9, adult: Secondary | ICD-10-CM | POA: Insufficient documentation

## 2020-02-18 DIAGNOSIS — I110 Hypertensive heart disease with heart failure: Secondary | ICD-10-CM | POA: Diagnosis not present

## 2020-02-18 DIAGNOSIS — Z7984 Long term (current) use of oral hypoglycemic drugs: Secondary | ICD-10-CM | POA: Insufficient documentation

## 2020-02-18 DIAGNOSIS — I5022 Chronic systolic (congestive) heart failure: Secondary | ICD-10-CM | POA: Insufficient documentation

## 2020-02-18 DIAGNOSIS — I428 Other cardiomyopathies: Secondary | ICD-10-CM | POA: Diagnosis not present

## 2020-02-18 LAB — COMPREHENSIVE METABOLIC PANEL
ALT: 25 U/L (ref 0–44)
AST: 22 U/L (ref 15–41)
Albumin: 3.9 g/dL (ref 3.5–5.0)
Alkaline Phosphatase: 75 U/L (ref 38–126)
Anion gap: 15 (ref 5–15)
BUN: 14 mg/dL (ref 6–20)
CO2: 24 mmol/L (ref 22–32)
Calcium: 10.2 mg/dL (ref 8.9–10.3)
Chloride: 98 mmol/L (ref 98–111)
Creatinine, Ser: 1.32 mg/dL — ABNORMAL HIGH (ref 0.61–1.24)
GFR calc Af Amer: >60 mL/min (ref >60)
GFR calc non Af Amer: >60 mL/min (ref >60)
Glucose, Bld: 267 mg/dL — ABNORMAL HIGH (ref 70–99)
Potassium: 4.6 mmol/L (ref 3.5–5.1)
Sodium: 137 mmol/L (ref 135–145)
Total Bilirubin: 0.9 mg/dL (ref 0.3–1.2)
Total Protein: 7.2 g/dL (ref 6.5–8.1)

## 2020-02-18 LAB — BRAIN NATRIURETIC PEPTIDE: B Natriuretic Peptide: 34.7 pg/mL (ref 0.0–100.0)

## 2020-02-18 MED ORDER — AMIODARONE HCL 200 MG PO TABS: 200.0000 mg | ORAL_TABLET | Freq: Every day | ORAL | 3 refills | Status: DC

## 2020-02-18 NOTE — Progress Notes (Signed)
ADVANCED HF CLINIC NOTE  HPI:  [name] is a 54 year old male with a history of HTN and poorly controlled diabetes who was admitted to [facility] from 7/5-7/20/20 with hypertensive urgency and acute systolic HF. ECG showed LBBB of uncertain duration.  He was admitted to the ICU and placed on IV NTG and underwent diuresis. Echocardiogram showed severe systolic dysfunction with EF 25-30%. Cardiac catheterization on 12/16/18 showed normal coronary arteries with well compensated filling pressures after diuresis.   RA = 4 RV = 34/3 PA = 31/8 (19) PCWP =  11 Fick = 5.1/2.24  He was started on GDMT including carvedilol 6.25 bid, Entresto 49/51 bid, spironolactone 12.5 daily. He did well for several days but then developed severe fatigue. I saw him in Clinic on 12/27/18. He was in bigeminy. I stopped his b-blocker and started amio 200 bid.K 5.0 Mg 1.7.  LifeVest placed.  Remains on amio to suppress PVCs. 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 Following with Dr. Renne Crigler. Working with Surgery and ENT for pituitary surgery   Here for f/u with his wife. Following BP closely SBP 120-130. Says he feels good but not as good as when he was younger. Says the heat bothers him. Can walk and do general stuff around the house without too much difficulty. No edema, orthopnea or PND. At last visit increased Entresto to 49/51 but made hem feel "loopy" so he stopped. He was on Jardiance but PCP switch to Invokana due to cost.    cMRI 10/21/19 1. Very difficult study due to frequent ectopy and respiratory artifact. 2. Moderately dilated left ventricle with estimated EF 20-25% (unable to quantify due to respiratory artifact and ectopy). Diffuse hypokinesis with basal to mid inferolateral akinesis. 3.  Normal RV size and systolic function. 4. Delayed enhancement images were also very difficult. However, there was clearly a dense basal to mid inferolateral scar. This looked most consistent with a prior MI  in the LCx territory.   Echo 09/03/19: EF 25% RV ok. Personally reviewed  Echo 11/19 EF ~35-40% with severe dyssynchrony due to LBBB and PVCs Personally reviewed  Zio patch 10/20: 4.8%PVCs  SH:  Social History   Socioeconomic History  . Marital status: Married    Spouse name: Everlene Farrier  . Number of children: 1  . Years of education: Not on file  . Highest education level: Not on file  Occupational History  . Occupation: Minister/carpenter/musician  Tobacco Use  . Smoking status: Former Research scientist (life sciences)  . Smokeless tobacco: Never Used  Vaping Use  . Vaping Use: Never used  Substance and Sexual Activity  . Alcohol use: Never  . Drug use: Never  . Sexual activity: Yes  Other Topics Concern  . Not on file  Social History Narrative  . Not on file   Social Determinants of Health   Financial Resource Strain:   . Difficulty of Paying Living Expenses: Not on file  Food Insecurity:   . Worried About [name] in the Last Year: Not on file  . Ran Out of Food in the Last Year: Not on file  Transportation Needs:   . Lack of Transportation (Medical): Not on file  . Lack of Transportation (Non-Medical): Not on file  Physical Activity:   . Days of Exercise per Week: Not on file  . Minutes of Exercise per Session: Not on file  Stress:   . Feeling of Stress : Not on file  Social Connections:   .  Frequency of Communication with Friends and Family: Not on file  . Frequency of Social Gatherings with Friends and Family: Not on file  . Attends Religious Services: Not on file  . Active Member of Clubs or Organizations: Not on file  . Attends Archivist Meetings: Not on file  . Marital Status: Not on file  Intimate Partner Violence:   . Fear of Current or Ex-Partner: Not on file  . Emotionally Abused: Not on file  . Physically Abused: Not on file  . Sexually Abused: Not on file    FH:  Family History  Problem Relation Age of Onset  . Hypertension Mother   . Heart  attack Mother   . Hypertension Father   . Hypertension Sister   . Hypertension Brother     Past Medical History:  Diagnosis Date  . Essential hypertension   . Hypertensive emergency 12/15/2018  . Obesity   . Systolic heart failure ([place])   . Type 2 diabetes mellitus with complication [facility])     Current Outpatient Medications  Medication Sig Dispense Refill  . amiodarone (PACERONE) 200 MG tablet Take 1 tablet (200 mg total) by mouth 2 (two) times daily. 60 tablet 3  . glipiZIDE (GLUCOTROL) 10 MG tablet Take 1 tablet (10 mg total) by mouth 2 (two) times daily before a meal. 180 tablet 1  . Loratadine (CLARITIN) 10 MG CAPS Take 10 mg by mouth daily as needed (allergies).    . metFORMIN (GLUCOPHAGE) 1000 MG tablet Take 1 tablet (1,000 mg total) by mouth 2 (two) times daily with a meal. 180 tablet 1  . sacubitril-valsartan (ENTRESTO) 24-26 MG Take 1 tablet by mouth 2 (two) times daily.    Marland Kitchen spironolactone (ALDACTONE) 25 MG tablet Take 1 tablet by mouth once daily 90 tablet 0   No current facility-administered medications for this encounter.    Vitals:   02/18/20 1445  BP: 135/90  Pulse: 83  SpO2: 97%  Weight: 105.2 kg (232 lb)   PHYSICAL EXAM: General:  Well appearing. No resp difficulty HEENT: normal + coarse features  Neck: supple. no JVD. Carotids 2+ bilat; no bruits. No lymphadenopathy or thryomegaly appreciated. Cor: PMI nondisplaced. Regular rate & rhythm. No rubs, gallops or murmurs. Lungs: clear Abdomen: soft, nontender, nondistended. No hepatosplenomegaly. No bruits or masses. Good bowel sounds. Extremities: no cyanosis, clubbing, rash, edema Neuro: alert & orientedx3, cranial nerves grossly intact. moves all 4 extremities w/o difficulty. Affect pleasant   ASSESSMENT & PLAN:  1. Chronic systolic HF - diagnosed 4/16 EF ~25-30%. NICM - cath 7/20 with normal coronaries and compensated hemodynamics - suspect he likely has LBBB +/- PVC-induced CM  - Echo 09/03/19 EF ~25%  with severe LV dyssynchrony (no improvement with PVC suppression) - Zio patch 10/20 with 4.8% PVC  - cMRI 5/21 EF 20-25% frequent ectopy. RV normal. Inferolateral scar - Feels much better today after restarting amio and HF meds.  - Volume status improved. NYHA II- early III - At last visit Entresto increased to 49/51 bid but says he could not tolerate - Continue amio 200 bid - Refuses all b-blockers as he is worried it will drop his BP again. - Continue spiro 25 - Continue Invokana (switched from jardiance due to cost) - cMRI 5/21 suggestive of PVC/LBBB CM but presence of ? Inferolateral scar is puzzling.  - Unclear if cardiomyopathy related to PVCs, LBBB (or both). EF initially did not get better with PVC suppression. Have referred to EP for consideration of  CRT. - Will repeat echo to see if EF improving with PVC suppression if not I have suggest CRT but he is not sure he wants to proceed.  - Lab today  2. Frequent PVCs - Suppressed with amio. Can drop dose to 200 daily - zio 10/20 4.8% PVC can repeat as needed - Can consider switch to mexilitene as needed. He refuses to retry b-blockers - home sleep study with AHI 3. (No significant OSA)  3. HTN - Blood pressure well controlled. Continue current regimen.  4. LBBB - discussion as above  5. Poorly controlled DM2 - Per his PCP. HgBA1c was 12.7 in 7/20 - Continue Invkokan - Hgba1c improving  6. Acromegaly - GH level elevated - has growth hormone excess - pending pituitary surgery - he is at low to moderate risk for CV complications. Can proceed without further testing.    Glori Bickers, MD  3:07 PM

## 2020-02-18 NOTE — Patient Instructions (Addendum)
DECREASE Amiodarone to 200mg  Daily  Labs done today, your results will be available in MyChart, we will contact you for abnormal readings.   Your physician has requested that you have an echocardiogram. Echocardiography is a painless test that uses sound waves to create images of your heart. It provides your doctor with information about the size and shape of your heart and how well your heart's chambers and valves are working. This procedure takes approximately one hour. There are no restrictions for this procedure.   Your physician recommends that you follow up with our clinic in: 3 months   If you have any questions or concerns before your next appointment please send [place] a message through [place] or call our office at [phone].    TO LEAVE A MESSAGE FOR THE NURSE SELECT OPTION 2, PLEASE LEAVE A MESSAGE INCLUDING: . YOUR NAME . DATE OF BIRTH . CALL BACK NUMBER . REASON FOR CALL**this is important as we prioritize the call backs  [place] AS LONG AS YOU CALL BEFORE 4:00 PM  At the [facility], you and your health needs are our priority. As part of our continuing mission to provide you with exceptional heart care, we have created designated Provider Care Teams. These Care Teams include your primary Cardiologist (physician) and Advanced Practice Providers (APPs- Physician Assistants and Nurse Practitioners) who all work together to provide you with the care you need, when you need it.   You may see any of the following providers on your designated Care Team at your next follow up: Marland Kitchen Dr Glori Bickers . Dr Loralie Champagne . Darrick Grinder, NP . Lyda Jester, PA . Audry Riles, PharmD   Please be sure to bring in all your medications bottles to every appointment.

## 2020-02-18 NOTE — Addendum Note (Signed)
Encounter addended by: Jolaine Artist, MD on: 02/18/2020 3:23 PM  Actions taken: Level of Service modified, Visit diagnoses modified

## 2020-02-23 ENCOUNTER — Other Ambulatory Visit (HOSPITAL_COMMUNITY): Payer: Self-pay | Admitting: Internal Medicine

## 2020-02-25 ENCOUNTER — Ambulatory Visit (HOSPITAL_COMMUNITY)
Admission: RE | Admit: 2020-02-25 | Discharge: 2020-02-25 | Disposition: A | Discharge status: Home / Self Care | Payer: BC Managed Care – PPO | Source: Ambulatory Visit | Attending: Internal Medicine | Admitting: Internal Medicine

## 2020-02-25 ENCOUNTER — Other Ambulatory Visit (HOSPITAL_COMMUNITY): Payer: BC Managed Care – PPO

## 2020-02-25 ENCOUNTER — Other Ambulatory Visit: Payer: Self-pay

## 2020-02-25 DIAGNOSIS — I447 Left bundle-branch block, unspecified: Secondary | ICD-10-CM | POA: Diagnosis not present

## 2020-02-25 DIAGNOSIS — I5022 Chronic systolic (congestive) heart failure: Secondary | ICD-10-CM | POA: Diagnosis not present

## 2020-02-25 NOTE — Progress Notes (Signed)
  Echocardiogram 2D Echocardiogram has been performed.  Kevin Hampton 02/25/2020, 11:53 AM

## 2020-02-26 LAB — ECHOCARDIOGRAM COMPLETE
P 1/2 time: 557 msec
S' Lateral: 3.8 cm

## 2020-03-01 ENCOUNTER — Encounter: Payer: Self-pay | Admitting: Internal Medicine

## 2020-03-01 ENCOUNTER — Ambulatory Visit: Payer: BC Managed Care – PPO | Admitting: Internal Medicine

## 2020-03-01 ENCOUNTER — Other Ambulatory Visit: Payer: Self-pay

## 2020-03-01 VITALS — BP 120/88 | HR 86 | Ht 76.0 in | Wt 232.0 lb

## 2020-03-01 DIAGNOSIS — E220 Acromegaly and pituitary gigantism: Secondary | ICD-10-CM

## 2020-03-01 DIAGNOSIS — D352 Benign neoplasm of pituitary gland: Secondary | ICD-10-CM | POA: Diagnosis not present

## 2020-03-01 DIAGNOSIS — E11630 Type 2 diabetes mellitus with periodontal disease: Secondary | ICD-10-CM | POA: Diagnosis not present

## 2020-03-01 LAB — POCT GLYCOSYLATED HEMOGLOBIN (HGB A1C): Hemoglobin A1C: 10.4 % — AB (ref 4.0–5.6)

## 2020-03-01 MED ORDER — BASAGLAR KWIKPEN 100 UNIT/ML ~~LOC~~ SOPN: 16.0000 [IU] | PEN_INJECTOR | Freq: Every day | SUBCUTANEOUS | 3 refills | Status: DC

## 2020-03-01 MED ORDER — INSULIN PEN NEEDLE 32G X 4 MM MISC: 3 refills | Status: DC

## 2020-03-01 NOTE — Patient Instructions (Addendum)
Please continue: - Metformin 1000 mg 2x a day - Glipizide 10 mg 2x a day - Invokana 100 mg before b'fast  Please start: - Basaglar 16 units at bedtime. Increase by 2 units every 2 days until am sugars are <130 or if you reach 36 units  When injecting insulin:  Inject in the abdomen  Rotate the injection sites around the belly button  Change needle for each injection  Keep needle in for 10 sec after last unit of insulin in  Keep the insulin in use out of the fridge  Please return in 1.5 months with your sugar log.

## 2020-03-01 NOTE — Progress Notes (Addendum)
Patient ID: Kevin Hampton, male   DOB: 06-06-1966, 54 y.o.   MRN: [id number]   This visit occurred during the SARS-CoV-2 public health emergency.  Safety protocols were in place, including screening questions prior to the visit, additional usage of staff PPE, and extensive cleaning of exam room while observing appropriate contact time as indicated for disinfecting solutions.   HPI  Kevin Hampton is a 54 y.o.-year-old male, initially referred by his cardiologist, [name], returning for follow-up for acromegaly, diagnosed after last visit.  At today's visit, we also addressed his uncontrolled DM2.  Last visit 3 months ago.  He is here with his wife, who offers part of the history, medications, symptoms.  Patient had an MRI performed in 12/2019 that showed a pituitary macroadenoma.  He does have acromegaly features and a high growth hormone.  Since then, he saw Dr. Marcello Moores with neurosurgery.  Plan is for transsphenoidal surgery but this was not scheduled yet.  Reviewed history: Pt describes that in late 31s, he noticed a change in his appearance: his jaw and forehead started to protrude.  He also has large fingers but he was quite obese and lost approximately 100 pounds approximately 10 to 15 years ago so he thought this was the cause why his fingers were larger.  He also had problems with increased spacing between his lower incisors. His son noticed a change in appearance and advised the patient to have his growth hormone checked.  Pt. was seen by [name] in 10/2019 and a growth hormone was checked at that time.  This returned elevated, so they pituitary MRI was ordered, but this was not done before last visit.  She was referred to endocrinology for investigation for acromegaly.  At last visit, he described: - + increase in ring size - + widening of spaces between teeth - + developing of an underbite - + deepening of the voice - no increase in shoe size - no headaches - + Increase  sweating - no joint pain, only occasionally in fingers - + occasional constipation - + now only rarely snoring, but had OSA before losing weight - repeated sleep study >> normal - + weight loss - + fatigue, low energy - + cold intolerance only at night, occasionally - no vision loss, no double vision, he prev. wore readers  -not since 03/2019  He also has: - cardiomegaly with cardiomyopathy and CHF (EF 20 to 25%, per review of his most recent cardiac MRI report) - diabetes type 2 (dx'ed 2012), uncontrolled, managed by PCP  Reviewed pertinent labs:  He had high LH, FSH, ACTH with normal cortisol and testosterone.  Very high IGF-I and growth hormone.  We did not perform a growth hormone suppression test due to high suspicion for acromegaly. Component     Latest Ref Rng & Units 11/24/2019  IGF-I, LC/MS     50 - 317 ng/mL 1,282 (H)  Z-Score (Male)     -2.0 - 2 SD 6.0 (H)  TSH     0.35 - 4.50 uIU/mL 0.84  T4,Free(Direct)     0.60 - 1.60 ng/dL 2.00 (H)  Triiodothyronine,Free,Serum     2.3 - 4.2 pg/mL 4.3 (H)  Growth Hormone     < OR = 7.1 ng/mL 48.9 (H)  LH     1.50 - 9.30 mIU/mL 13.07 (H)  FSH     1.4 - 18.1 mIU/ML 53.9 (H)  Prolactin     2.0 - 18.0 ng/mL 14.3  Cortisol, Plasma  ug/dL 13.6  C206 ACTH     6 - 50 pg/mL 60 (H)  Testosterone, total     264.0 - 916.0 ng/dL 341.9  10/28/2019: Growth hormone 27.9 (0-10)  After the above results returned, we checked a pituitary MRI (01/09/2020) which showed a macroadenoma: 2.5 cm macroadenoma with suprasellar extension and abutment of the inferior optic chiasm. Mild splaying of the pre-chiasmatic optic nerves.  Sella floor defect with extension of tumor into the right sphenoid sinus.  Bilateral cavernous sinus involvement without significant encasement or luminal narrowing of the intracavernous ICAs.  Nonspecific supratentorial white matter T2 hyperintensities. Differential includes minimal chronic microvascular ischemic  changes versus post infectious/inflammatory sequela.  Osseous prominence of the bilateral supraorbital margin, anterior maxillary walls and mandibular condyles likely reflects sequela of acromegaly.      He has a slightly high free T4 but normal TSH: Lab Results  Component Value Date   TSH 0.84 11/24/2019   TSH 0.955 10/28/2019   TSH 0.845 05/01/2019   TSH 0.789 12/15/2018   FREET4 2.00 (H) 11/24/2019   FREET4 2.26 (H) 10/28/2019   FREET4 1.60 (H) 05/01/2019  Of note, he is on amiodarone.  No previous colonoscopies.  He has a history of nasal polyp surgeries and mentions that he has a lot of scar tissue in his nose.  He lost a significant amount of weight after he moved from [place] [employer] to [place].  He attributed this to his diabetes.  This was not fully intentional.  No FH of DM, pituitary tumors.   He is a Theme park manager.  He also has uncontrolled DM2: - dx'ed 2013  Lab Results  Component Value Date   HGBA1C 11.8 (A) 12/05/2019   HGBA1C 8.7 (A) 09/10/2019   HGBA1C 10.0 (A) 03/17/2019   HGBA1C 12.7 (H) 12/15/2018   He is on: - Metformin 1000 mg 2x a day - Glipizide 10 mg 2x a day - Jardiance >> Invokana 100 mg before b'fast  He does not check sugar usually. He checked only once recently found 221.  He refused injectable medications in the past.  No FH of DM2.  ROS: Constitutional: + See HPI, no weight gain or weight loss since last visit, + fatigue, + increased sweating, no subjective hypothermia, + poor sleep Eyes: no blurry vision, no xerophthalmia ENT: no sore throat, no nodules palpated in neck, no dysphagia, no odynophagia, no hoarseness Cardiovascular: no CP/no SOB/no palpitations/no leg swelling Respiratory: no cough/no SOB/no wheezing Gastrointestinal: no N/no V/no D/+ C/+ acid reflux Musculoskeletal: no muscle aches/+ joint aches Skin: no rashes, no hair loss Neurological: + Occasional tremors/no numbness/no tingling/no dizziness  I reviewed pt's  medications, allergies, PMH, social hx, family hx, and changes were documented in the history of present illness. Otherwise, unchanged from my initial visit note.  Past Medical History:  Diagnosis Date  . Essential hypertension   . Hypertensive emergency 12/15/2018  . Obesity   . Systolic heart failure ([place])   . Type 2 diabetes mellitus with complication [facility])    Past Surgical History:  Procedure Laterality Date  . RIGHT/LEFT HEART CATH AND CORONARY ANGIOGRAPHY N/A 12/16/2018   Procedure: RIGHT/LEFT HEART CATH AND CORONARY ANGIOGRAPHY;  Surgeon: Lorretta Harp, MD;  Location: [facility];  Service: Cardiovascular;  Laterality: N/A;   Social History   Socioeconomic History  . Marital status: Married    Spouse name: Everlene Farrier  . Number of children: 1  . Years of education: Not on file  . Highest education level:  Not on file  Occupational History  . Occupation: Minister/carpenter/musician  Tobacco Use  . Smoking status: Former Research scientist (life sciences)  . Smokeless tobacco: Never Used  Vaping Use  . Vaping Use: Never used  Substance and Sexual Activity  . Alcohol use: Never  . Drug use: Never  . Sexual activity: Yes  Other Topics Concern  . Not on file  Social History Narrative  . Not on file   Social Determinants of Health   Financial Resource Strain:   . Difficulty of Paying Living Expenses: Not on file  Food Insecurity:   . Worried About [name] in the Last Year: Not on file  . Ran Out of Food in the Last Year: Not on file  Transportation Needs:   . Lack of Transportation (Medical): Not on file  . Lack of Transportation (Non-Medical): Not on file  Physical Activity:   . Days of Exercise per Week: Not on file  . Minutes of Exercise per Session: Not on file  Stress:   . Feeling of Stress : Not on file  Social Connections:   . Frequency of Communication with Friends and Family: Not on file  . Frequency of Social Gatherings with Friends and Family: Not on file  .  Attends Religious Services: Not on file  . Active Member of Clubs or Organizations: Not on file  . Attends Archivist Meetings: Not on file  . Marital Status: Not on file  Intimate Partner Violence:   . Fear of Current or Ex-Partner: Not on file  . Emotionally Abused: Not on file  . Physically Abused: Not on file  . Sexually Abused: Not on file   Current Outpatient Medications on File Prior to Visit  Medication Sig Dispense Refill  . amiodarone (PACERONE) 200 MG tablet Take 1 tablet (200 mg total) by mouth daily. 60 tablet 3  . ENTRESTO 24-26 MG Take 1 tablet by mouth twice daily 60 tablet 11  . glipiZIDE (GLUCOTROL) 10 MG tablet Take 1 tablet (10 mg total) by mouth 2 (two) times daily before a meal. 180 tablet 1  . Loratadine (CLARITIN) 10 MG CAPS Take 10 mg by mouth daily as needed (allergies).    . metFORMIN (GLUCOPHAGE) 1000 MG tablet Take 1 tablet (1,000 mg total) by mouth 2 (two) times daily with a meal. 180 tablet 1  . spironolactone (ALDACTONE) 25 MG tablet Take 1 tablet by mouth once daily 90 tablet 0   No current facility-administered medications on file prior to visit.   Allergies  Allergen Reactions  . Coreg [Carvedilol] Other (See Comments)    Brings heart rate and blood pressure down severly   Family History  Problem Relation Age of Onset  . Hypertension Mother   . Heart attack Mother   . Hypertension Father   . Hypertension Sister   . Hypertension Brother     PE: BP 120/88   Pulse 86   Ht 6\' 4"  (1.93 m)   Wt 232 lb (105.2 kg)   SpO2 99%   BMI 28.24 kg/m  Wt Readings from Last 3 Encounters:  03/01/20 232 lb (105.2 kg)  02/18/20 232 lb (105.2 kg)  12/05/19 235 lb (106.6 kg)   Constitutional: overweight, in NAD, + deep voice, + frontal bossing, + enlarged jaw with widely spaced incisors, + enlarged lower lip, + prominent nasolabial folds Eyes: PERRLA, EOMI, no exophthalmos ENT: moist mucous membranes, no thyromegaly, no cervical  lymphadenopathy Cardiovascular: RRR, No MRG Respiratory: CTA B Gastrointestinal: abdomen  soft, NT, ND, BS+ Musculoskeletal: + Deformities (enlarged jaw, fingers, forehead), strength intact in all 4 Skin: moist, warm, no rashes, + skin tags on neck Neurological: no tremor with outstretched hands, DTR normal in all 4  ASSESSMENT: 1.  Acromegaly  2. DM2, uncontrolled  PLAN:  1. Patient with an approximately 20-year history of facial feature changes (at last visit, she showed me a picture from his wedding day and there was absolutely no resemblance between the person in the picture and the person in front of me).  He has all physical features of acromegaly (see HPI and physical exam).  Also, he has cardiomegaly, which can be a feature of acromegaly.  He also has uncontrolled diabetes and he is the only family member with this condition.  A growth hormone checked by [name] returned high.  At last visit, we checked an IGF-I along with her growth, and these were very high.  Also, he had high LH, FSH, and ACTH. -After the results returned, I referred him for a pituitary MRI.  This was done on 01/09/2020 and showed a pituitary macroadenoma measuring 2.5 cm, with suprasellar extension and abutting of the inferior optic chiasm.  Also, tumor eroded the sella floor and extended in the right sphenoid sinus. -We discussed about etiology, manifestations and complications of the disease. -I explained that the first line of treatment is pituitary surgery. -We discussed about the different types of pituitary surgeries  Transsphenoidal nasal (most commonly used, however, since he has scar tissue from previous sinus surgery, this may prove to be difficult)  Transsphenoidal sublabial (under upper lip)  Open craniotomy (rarely) -At last visit we discussed about what surgery consists of and possible complications -He did see Dr. Marcello Moores with neurosurgery and surgery is planned, but was not scheduled yet.  Dr.  Wilburn Cornelia with ENT will also be involved. -We discussed about the possibility of remnant tumor, which may be treated with somatostatin, Pegvisomant, or gamma knife.  2. DM2 - new pb for me -Latest HbA1c was reviewed along with the patient and this was very high, at 11.8% >> 10.4% today -He declined insulin or other injectables in the past -We discussed that we absolutely need to get this under control before his pituitary surgery - At today's visit, I suggested addition of insulin and he reluctantly agrees - will start Olmitz, which appears to be the cheapest basal insulin for him - Discussed about correct injection techniques and I demonstrated the insulin pen use - we we will continue the rest of his regimen for now - he would like to start making changes in his diet to improve his sugars and I advised him that he should absolutely start doing and in that case, we may be able to decrease and even stop the insulin in the interim. - advised to check sugars at different times of the day - 1-2x a day, rotating check times - return to clinic in 1.5 months  Patient Instructions  Please continue: - Metformin 1000 mg 2x a day - Glipizide 10 mg 2x a day - Invokana 100 mg before b'fast  Please start: - Basaglar 16 units at bedtime. Increase by 2 units every 2 days until am sugars are <130 or if you reach 36 units  When injecting insulin:  Inject in the abdomen  Rotate the injection sites around the belly button  Change needle for each injection  Keep needle in for 10 sec after last unit of insulin in  Keep the insulin  in use out of the fridge  Please return in 1.5 months with your sugar log.   - Total time spent for the visit: 40 minutes, in obtaining medical information from the chart, reviewing his  previous labs, imaging evaluations, and treatments, reviewing his symptoms, counseling him about his conditions (please see the discussed topics above), and developing a plan to further  investigate and treat them; the patient and his wife had a number of questions which I addressed.  Philemon Kingdom, MD PhD [facility] Endocrinology

## 2020-03-02 ENCOUNTER — Telehealth (HOSPITAL_COMMUNITY): Payer: Self-pay | Admitting: *Deleted

## 2020-03-02 NOTE — Telephone Encounter (Signed)
pts wife left VM stating endocrinologist wants pt to start insulin pen Basaglar for his diabetes. Pt wanted to make sure this was ok to take with his cardiac medications before he tells endocrinologist to prescribe medication to him.   Routed to [place] for advice

## 2020-03-03 NOTE — Telephone Encounter (Signed)
Dr. Renne Crigler contacted me. I agree with her plan.

## 2020-03-08 ENCOUNTER — Ambulatory Visit: Payer: BC Managed Care – PPO | Admitting: Nurse Practitioner

## 2020-03-08 ENCOUNTER — Telehealth: Payer: Self-pay | Admitting: Nurse Practitioner

## 2020-03-08 ENCOUNTER — Encounter: Payer: Self-pay | Admitting: Nurse Practitioner

## 2020-03-08 NOTE — Telephone Encounter (Signed)
Pt is wanting a medication refill on prescription pt wasn't sure of name . Please advise

## 2020-03-09 ENCOUNTER — Other Ambulatory Visit: Payer: Self-pay | Admitting: Nurse Practitioner

## 2020-03-09 MED ORDER — SITAGLIPTIN PHOSPHATE 50 MG PO TABS: 50.0000 mg | ORAL_TABLET | Freq: Every day | ORAL | 2 refills | Status: DC

## 2020-03-09 MED ORDER — ATORVASTATIN CALCIUM 20 MG PO TABS: 20.0000 mg | ORAL_TABLET | Freq: Every day | ORAL | 3 refills | Status: DC

## 2020-03-09 MED ORDER — VICTOZA 18 MG/3ML ~~LOC~~ SOPN: PEN_INJECTOR | SUBCUTANEOUS | 1 refills | Status: DC

## 2020-03-10 NOTE — Telephone Encounter (Signed)
Patient request [facility] but insurance will not pay for it. He was informed that sitaGliptin was sent to pharmacy on yesterday. Verbalized understanding.

## 2020-03-17 DIAGNOSIS — J341 Cyst and mucocele of nose and nasal sinus: Secondary | ICD-10-CM | POA: Diagnosis not present

## 2020-03-17 DIAGNOSIS — J342 Deviated nasal septum: Secondary | ICD-10-CM | POA: Diagnosis not present

## 2020-03-17 DIAGNOSIS — D352 Benign neoplasm of pituitary gland: Secondary | ICD-10-CM | POA: Diagnosis not present

## 2020-03-17 DIAGNOSIS — J3489 Other specified disorders of nose and nasal sinuses: Secondary | ICD-10-CM | POA: Diagnosis not present

## 2020-03-17 DIAGNOSIS — D492 Neoplasm of unspecified behavior of bone, soft tissue, and skin: Secondary | ICD-10-CM | POA: Diagnosis not present

## 2020-03-17 DIAGNOSIS — E236 Other disorders of pituitary gland: Secondary | ICD-10-CM | POA: Diagnosis not present

## 2020-03-17 DIAGNOSIS — J343 Hypertrophy of nasal turbinates: Secondary | ICD-10-CM | POA: Diagnosis not present

## 2020-03-19 ENCOUNTER — Other Ambulatory Visit: Payer: Self-pay | Admitting: Neurosurgery

## 2020-03-23 ENCOUNTER — Other Ambulatory Visit: Payer: Self-pay | Admitting: Neurosurgery

## 2020-03-25 ENCOUNTER — Other Ambulatory Visit: Payer: Self-pay | Admitting: Otolaryngology

## 2020-03-30 ENCOUNTER — Other Ambulatory Visit (HOSPITAL_COMMUNITY): Payer: Self-pay | Admitting: Neurosurgery

## 2020-03-30 ENCOUNTER — Other Ambulatory Visit: Payer: Self-pay | Admitting: Neurosurgery

## 2020-03-30 ENCOUNTER — Telehealth (HOSPITAL_COMMUNITY): Payer: Self-pay | Admitting: *Deleted

## 2020-03-30 DIAGNOSIS — D497 Neoplasm of unspecified behavior of endocrine glands and other parts of nervous system: Secondary | ICD-10-CM

## 2020-03-30 NOTE — Telephone Encounter (Signed)
Received fax from [place] [facility], pt needs clearance for transphenoidal resection under general anesthesia  Per Dr Haroldine Laws: "moderate risk, ok to proceed"  Note faxed back to them to [phone] atten Lexine Baton

## 2020-03-31 ENCOUNTER — Ambulatory Visit: Payer: BC Managed Care – PPO | Admitting: Physician Assistant

## 2020-04-06 ENCOUNTER — Other Ambulatory Visit: Payer: Self-pay

## 2020-04-06 ENCOUNTER — Encounter: Payer: Self-pay | Admitting: Nurse Practitioner

## 2020-04-06 ENCOUNTER — Ambulatory Visit: Payer: BC Managed Care – PPO | Attending: Nurse Practitioner | Admitting: Nurse Practitioner

## 2020-04-06 VITALS — BP 139/84 | HR 72 | Temp 97.7°F | Ht 76.0 in | Wt 236.2 lb

## 2020-04-06 DIAGNOSIS — Z01818 Encounter for other preprocedural examination: Secondary | ICD-10-CM

## 2020-04-06 DIAGNOSIS — E11630 Type 2 diabetes mellitus with periodontal disease: Secondary | ICD-10-CM | POA: Diagnosis not present

## 2020-04-06 DIAGNOSIS — I10 Essential (primary) hypertension: Secondary | ICD-10-CM

## 2020-04-06 LAB — GLUCOSE, POCT (MANUAL RESULT ENTRY): POC Glucose: 127 mg/dl — AB (ref 70–99)

## 2020-04-06 MED ORDER — GLIPIZIDE 10 MG PO TABS: 10.0000 mg | ORAL_TABLET | Freq: Two times a day (BID) | ORAL | 1 refills | Status: DC

## 2020-04-06 MED ORDER — DAPAGLIFLOZIN PROPANEDIOL 10 MG PO TABS: 10.0000 mg | ORAL_TABLET | Freq: Every day | ORAL | 0 refills | Status: DC

## 2020-04-06 NOTE — Progress Notes (Signed)
Assessment & Plan:  Kevin Hampton was seen today for medical clearance.  Diagnoses and all orders for this visit:  Type 2 diabetes mellitus with periodontal disease, without long-term current use of insulin (HCC) -     Microalbumin/Creatinine Ratio, Urine -     Glucose (CBG) -     CMP14+EGFR -     Lipid panel -     dapagliflozin propanediol (FARXIGA) 10 MG TABS tablet; Take 1 tablet (10 mg total) by mouth daily before breakfast. (Patient not taking: Reported on 04/06/2020) -     glipiZIDE (GLUCOTROL) 10 MG tablet; Take 1 tablet (10 mg total) by mouth 2 (two) times daily before a meal. Continue blood sugar control as discussed in office today, low carbohydrate diet, and regular physical exercise as tolerated, 150 minutes per week (30 min each day, 5 days per week, or 50 min 3 days per week). Keep blood sugar logs with fasting goal of 90-130 mg/dl, post prandial (after you eat) less than 180.  For Hypoglycemia: BS <60 and Hyperglycemia BS >400; contact the clinic ASAP. Annual eye exams and foot exams are recommended.   Primary hypertension -     CMP14+EGFR  Preoperative clearance -     CBC    Patient has been counseled on age-appropriate routine health concerns for screening and prevention. These are reviewed and up-to-date. Referrals have been placed accordingly. Immunizations are up-to-date or declined.    Subjective:   Chief Complaint  Patient presents with  . Medical Clearance    Pt. is here for medication clearance for surgery.    HPI Kevin Hampton 54 y.o. male presents to office today with complaints of abdominal pain medical clearance. He has been cleared by cardiology. Needs tighter DM control.  Will approve for surgery.  Doing well today. Has surgery upcoming for transphenoidal resection of tumor on 04-15-2020.   Essential Hypertension Blood pressure is well controlled.  He will continue on amiodarone 200 mg daily, Entresto 24-26 mg twice daily and spironolactone 25 mg daily.     A1c is elevated however he states he has been making changes with his current diet. Notes blood glucose range 70-130s. Will add farxiga today and dc januvia.  He will continue on Metformin 1000 mg twice daily and glipizide 10 mg twice daily. BP Readings from Last 3 Encounters:  04/06/20 139/84  03/01/20 120/88  02/18/20 135/90   Lab Results  Component Value Date   HGBA1C 10.4 (A) 03/01/2020   SEE NOTES" Patient with a history of acromegaly and growth hormone secretion. MRI scan showed pituitary mass. Patient is scheduled to undergo endoscopic transsphenoidal pituitary resection at [facility]. We will schedule him for a CT scan of the sinuses to better assess his sinus anatomy. He may require nasal septoplasty in addition to limited endoscopic sinus surgery at the time of his neurosurgical procedure.      Review of Systems  Constitutional: Negative for fever, malaise/fatigue and weight loss.  HENT: Negative.  Negative for nosebleeds.   Eyes: Negative.  Negative for blurred vision, double vision and photophobia.  Respiratory: Negative.  Negative for cough and shortness of breath.   Cardiovascular: Negative.  Negative for chest pain, palpitations and leg swelling.  Gastrointestinal: Negative.  Negative for heartburn, nausea and vomiting.  Musculoskeletal: Negative.  Negative for myalgias.  Neurological: Negative.  Negative for dizziness, focal weakness, seizures and headaches.  Psychiatric/Behavioral: Negative.  Negative for suicidal ideas.    Past Medical History:  Diagnosis Date  .  Essential hypertension   . Hypertensive emergency 12/15/2018  . Obesity   . Systolic heart failure ([place])   . Type 2 diabetes mellitus with complication [facility])     Past Surgical History:  Procedure Laterality Date  . RIGHT/LEFT HEART CATH AND CORONARY ANGIOGRAPHY N/A 12/16/2018   Procedure: RIGHT/LEFT HEART CATH AND CORONARY ANGIOGRAPHY;  Surgeon: Lorretta Harp, MD;  Location: [facility];  Service: Cardiovascular;  Laterality: N/A;    Family History  Problem Relation Age of Onset  . Hypertension Mother   . Heart attack Mother   . Hypertension Father   . Hypertension Sister   . Hypertension Brother     Social History Reviewed with no changes to be made today.   Outpatient Medications Prior to Visit  Medication Sig Dispense Refill  . amiodarone (PACERONE) 200 MG tablet Take 1 tablet (200 mg total) by mouth daily. 60 tablet 3  . atorvastatin (LIPITOR) 20 MG tablet Take 1 tablet (20 mg total) by mouth daily. (Patient not taking: Reported on 04/06/2020) 90 tablet 3  . ENTRESTO 24-26 MG Take 1 tablet by mouth twice daily (Patient taking differently: Take 1 tablet by mouth 2 (two) times daily. ) 60 tablet 11  . loratadine (CLARITIN) 10 MG tablet Take 10 mg by mouth daily as needed (allergies). (Patient not taking: Reported on 04/06/2020)    . spironolactone (ALDACTONE) 25 MG tablet Take 1 tablet by mouth once daily (Patient taking differently: Take 25 mg by mouth daily. ) 90 tablet 0  . Insulin Pen Needle 32G X 4 MM MISC Use 1x a day 100 each 3  . sitaGLIPtin (JANUVIA) 50 MG tablet Take 1 tablet (50 mg total) by mouth daily. 30 tablet 2  . metFORMIN (GLUCOPHAGE) 1000 MG tablet Take 1 tablet (1,000 mg total) by mouth 2 (two) times daily with a meal. 180 tablet 1  . glipiZIDE (GLUCOTROL) 10 MG tablet Take 1 tablet (10 mg total) by mouth 2 (two) times daily before a meal. 180 tablet 1  . Insulin Glargine (BASAGLAR KWIKPEN) 100 UNIT/ML Inject 16 Units into the skin at bedtime. 15 mL 3   No facility-administered medications prior to visit.    Allergies  Allergen Reactions  . Coreg [Carvedilol] Other (See Comments)    Brings heart rate and blood pressure down severly       Objective:    BP 139/84 (BP Location: Left Arm, Patient Position: Sitting, Cuff Size: Normal)   Pulse 72   Temp 97.7 F (36.5 C) (Temporal)   Ht '6\' 4"'  (1.93 m)   Wt 236 lb 3.2 oz (107.1 kg)    SpO2 100%   BMI 28.75 kg/m  Wt Readings from Last 3 Encounters:  04/06/20 236 lb 3.2 oz (107.1 kg)  03/01/20 232 lb (105.2 kg)  02/18/20 232 lb (105.2 kg)    Physical Exam Vitals and nursing note reviewed.  Constitutional:      Appearance: He is well-developed.  HENT:     Head: Normocephalic and atraumatic.  Cardiovascular:     Rate and Rhythm: Normal rate and regular rhythm.     Heart sounds: Normal heart sounds. No murmur heard.  No friction rub. No gallop.   Pulmonary:     Effort: Pulmonary effort is normal. No tachypnea or respiratory distress.     Breath sounds: Normal breath sounds. No decreased breath sounds, wheezing, rhonchi or rales.  Chest:     Chest wall: No tenderness.  Abdominal:  General: Bowel sounds are normal.     Palpations: Abdomen is soft.  Musculoskeletal:        General: Normal range of motion.     Cervical back: Normal range of motion.  Skin:    General: Skin is warm and dry.  Neurological:     Mental Status: He is alert and oriented to person, place, and time.     Coordination: Coordination normal.  Psychiatric:        Behavior: Behavior normal. Behavior is cooperative.        Thought Content: Thought content normal.        Judgment: Judgment normal.          Patient has been counseled extensively about nutrition and exercise as well as the importance of adherence with medications and regular follow-up. The patient was given clear instructions to go to ER or return to medical center if symptoms don't improve, worsen or new problems develop. The patient verbalized understanding.   Follow-up: Return in about 4 weeks (around 05/04/2020) for [place]. Started farxiga. SEE ME IN 3 months.   Gildardo Pounds, FNP-BC [facility] [place], [place]   04/06/2020, 2:33 PM

## 2020-04-07 LAB — CMP14+EGFR
ALT: 24 IU/L (ref 0–44)
AST: 31 IU/L (ref 0–40)
Albumin/Globulin Ratio: 1.7 (ref 1.2–2.2)
Albumin: 4.3 g/dL (ref 3.8–4.9)
Alkaline Phosphatase: 76 IU/L (ref 44–121)
BUN/Creatinine Ratio: 14 (ref 9–20)
BUN: 16 mg/dL (ref 6–24)
Bilirubin Total: 0.6 mg/dL (ref 0.0–1.2)
CO2: 25 mmol/L (ref 20–29)
Calcium: 9.9 mg/dL (ref 8.7–10.2)
Chloride: 102 mmol/L (ref 96–106)
Creatinine, Ser: 1.12 mg/dL (ref 0.76–1.27)
GFR calc Af Amer: 85 mL/min/{1.73_m2} (ref >59)
GFR calc non Af Amer: 74 mL/min/{1.73_m2} (ref >59)
Globulin, Total: 2.6 g/dL (ref 1.5–4.5)
Glucose: 132 mg/dL — ABNORMAL HIGH (ref 65–99)
Potassium: 4.7 mmol/L (ref 3.5–5.2)
Sodium: 140 mmol/L (ref 134–144)
Total Protein: 6.9 g/dL (ref 6.0–8.5)

## 2020-04-07 LAB — LIPID PANEL
Chol/HDL Ratio: 3.2 ratio (ref 0.0–5.0)
Cholesterol, Total: 168 mg/dL (ref 100–199)
HDL: 52 mg/dL (ref >39)
LDL Chol Calc (NIH): 92 mg/dL (ref 0–99)
Triglycerides: 139 mg/dL (ref 0–149)
VLDL Cholesterol Cal: 24 mg/dL (ref 5–40)

## 2020-04-07 LAB — CBC
Hematocrit: 46.7 % (ref 37.5–51.0)
Hemoglobin: 15.7 g/dL (ref 13.0–17.7)
MCH: 31.1 pg (ref 26.6–33.0)
MCHC: 33.6 g/dL (ref 31.5–35.7)
MCV: 93 fL (ref 79–97)
Platelets: 217 10*3/uL (ref 150–450)
RBC: 5.05 x10E6/uL (ref 4.14–5.80)
RDW: 12.6 % (ref 11.6–15.4)
WBC: 6.2 10*3/uL (ref 3.4–10.8)

## 2020-04-07 LAB — MICROALBUMIN / CREATININE URINE RATIO
Creatinine, Urine: 160.1 mg/dL
Microalb/Creat Ratio: 33 mg/g creat — ABNORMAL HIGH (ref 0–29)
Microalbumin, Urine: 53 ug/mL

## 2020-04-08 ENCOUNTER — Encounter: Payer: Self-pay | Admitting: Internal Medicine

## 2020-04-09 ENCOUNTER — Other Ambulatory Visit: Payer: Self-pay

## 2020-04-09 ENCOUNTER — Ambulatory Visit (INDEPENDENT_AMBULATORY_CARE_PROVIDER_SITE_OTHER): Payer: BC Managed Care – PPO | Admitting: Internal Medicine

## 2020-04-09 ENCOUNTER — Encounter: Payer: Self-pay | Admitting: Internal Medicine

## 2020-04-09 VITALS — BP 110/82 | HR 78 | Ht 76.0 in | Wt 239.8 lb

## 2020-04-09 DIAGNOSIS — D352 Benign neoplasm of pituitary gland: Secondary | ICD-10-CM

## 2020-04-09 DIAGNOSIS — E11630 Type 2 diabetes mellitus with periodontal disease: Secondary | ICD-10-CM | POA: Diagnosis not present

## 2020-04-09 DIAGNOSIS — E220 Acromegaly and pituitary gigantism: Secondary | ICD-10-CM

## 2020-04-09 NOTE — Progress Notes (Addendum)
Patient ID: Kevin Hampton, male   DOB: 1965-07-25, 54 y.o.   MRN: [id number]   This visit occurred during the SARS-CoV-2 public health emergency.  Safety protocols were in place, including screening questions prior to the visit, additional usage of staff PPE, and extensive cleaning of exam room while observing appropriate contact time as indicated for disinfecting solutions.   HPI  Kevin Hampton is a 54 y.o.-year-old male, initially referred by his cardiologist, [name], returning for follow-up for acromegaly, diagnosed after last visit and uncontrolled DM2.  Last visit 1.5 months ago.  At this visit, he returned mainly for follow-up for his diabetes, however, this is now actively managed by PCP.  He tells me he improved diet since our last OV: cut out fried foods, reduced starches, cut down portions and sweets. Sugars improved to 78-135.  He is preparing for transsphenoidal surgery for acromegaly with [name] for 04/15/2020.  Reviewed history: Pt describes that in late 1s, he noticed a change in his appearance: his jaw and forehead started to protrude.  He also has large fingers but he was quite obese and lost approximately 100 pounds approximately 10 to 15 years ago so he thought this was the cause why his fingers were larger.  He also had problems with increased spacing between his lower incisors. His son noticed a change in appearance and advised the patient to have his growth hormone checked.  Pt. was seen by [name] in 10/2019 and a growth hormone was checked at that time.  This returned elevated, so they pituitary MRI was ordered, but this was not done before last visit.  She was referred to endocrinology for investigation for acromegaly.  At last visit, he described: - + increase in ring size - + widening of spaces between teeth - + developing of an underbite - + deepening of the voice - no increase in shoe size - no headaches - + Increase sweating - no  joint pain, only occasionally in fingers - + occasional constipation - + now only rarely snoring, but had OSA before losing weight - repeated sleep study >> normal - + weight loss - + fatigue, low energy - + cold intolerance only at night, occasionally - no vision loss, no double vision, he prev. wore readers  -not since 03/2019  He also has: - cardiomegaly with cardiomyopathy and CHF (EF 20 to 25%, per review of his most recent cardiac MRI report) - diabetes type 2 (dx'ed 2012), uncontrolled  At today's visit, he tells me that he is feeling much better after he improved his diet and sugars improved.  He is not tired anymore and tries to stay active.  Reviewed pertinent labs:  He had high LH, FSH, ACTH, with normal cortisol and testosterone.  He had very high IGF-I and growth hormone..  We did not perform a growth hormone suppression test due to high suspicion for acromegaly. Component     Latest Ref Rng & Units 11/24/2019  IGF-I, LC/MS     50 - 317 ng/mL 1,282 (H)  Z-Score (Male)     -2.0 - 2 SD 6.0 (H)  TSH     0.35 - 4.50 uIU/mL 0.84  T4,Free(Direct)     0.60 - 1.60 ng/dL 2.00 (H)  Triiodothyronine,Free,Serum     2.3 - 4.2 pg/mL 4.3 (H)  Growth Hormone     < OR = 7.1 ng/mL 48.9 (H)  LH     1.50 - 9.30 mIU/mL 13.07 (H)  FSH  1.4 - 18.1 mIU/ML 53.9 (H)  Prolactin     2.0 - 18.0 ng/mL 14.3  Cortisol, Plasma     ug/dL 13.6  C206 ACTH     6 - 50 pg/mL 60 (H)  Testosterone, total     264.0 - 916.0 ng/dL 341.9  10/28/2019: Growth hormone 27.9 (0-10)  Pituitary MRI (01/09/2020) showed a macroadenoma: 2.5 cm macroadenoma with suprasellar extension and abutment of the inferior optic chiasm. Mild splaying of the pre-chiasmatic optic nerves.  Sella floor defect with extension of tumor into the right sphenoid sinus.  Bilateral cavernous sinus involvement without significant encasement or luminal narrowing of the intracavernous ICAs.  Nonspecific supratentorial white  matter T2 hyperintensities. Differential includes minimal chronic microvascular ischemic changes versus post infectious/inflammatory sequela.  Osseous prominence of the bilateral supraorbital margin, anterior maxillary walls and mandibular condyles likely reflects sequela of acromegaly.      He has a slightly high free T4 but normal TSH: Lab Results  Component Value Date   TSH 0.84 11/24/2019   TSH 0.955 10/28/2019   TSH 0.845 05/01/2019   TSH 0.789 12/15/2018   FREET4 2.00 (H) 11/24/2019   FREET4 2.26 (H) 10/28/2019   FREET4 1.60 (H) 05/01/2019  He is on amiodarone.  No previous colonoscopies.  He has a history of nasal polyp surgeries and mentions that he has a lot of scar tissue in his nose.  He lost a significant amount of weight after he moved from [place] to [place].  He attributed this to his diabetes.  This was not fully intentional.  No FH of DM, pituitary tumors.   He is a Theme park manager.  He also has uncontrolled DM2: - dx'ed 2013 -Managed by PCP  Reviewed HbA1c levels: Lab Results  Component Value Date   HGBA1C 10.4 (A) 03/01/2020   HGBA1C 11.8 (A) 12/05/2019   HGBA1C 8.7 (A) 09/10/2019   HGBA1C 10.0 (A) 03/17/2019   HGBA1C 12.7 (H) 12/15/2018   He is on: - Metformin 1000 mg 2x a day - Glipizide 10 mg 2x a day - Jardiance >> Invokana 100 mg before b'fast >> Januvia 100 mg >> Farxiga 10 mg before breakfast He refused injectable medications in the past. At last visit I recommended Basaglar 16 units at bedtime, but he did not start this.  No FH of DM2.  ROS: Constitutional: no weight gain/no weight loss, + much improved fatigue, + subjective hyperthermia, no subjective hypothermia Eyes: no blurry vision, no xerophthalmia ENT: no sore throat, no nodules palpated in neck, no dysphagia, no odynophagia, no hoarseness Cardiovascular: no CP/no SOB/no palpitations/no leg swelling Respiratory: no cough/no SOB/no wheezing Gastrointestinal: no N/no V/no D/+  C/+ acid reflux Musculoskeletal: no muscle aches/+ joint aches Skin: no rashes, no hair loss Neurological: + tremors/no numbness/no tingling/no dizziness  I reviewed pt's medications, allergies, PMH, social hx, family hx, and changes were documented in the history of present illness. Otherwise, unchanged from my initial visit note.  Past Medical History:  Diagnosis Date  . Essential hypertension   . Hypertensive emergency 12/15/2018  . Obesity   . Systolic heart failure ([place])   . Type 2 diabetes mellitus with complication [facility])    Past Surgical History:  Procedure Laterality Date  . RIGHT/LEFT HEART CATH AND CORONARY ANGIOGRAPHY N/A 12/16/2018   Procedure: RIGHT/LEFT HEART CATH AND CORONARY ANGIOGRAPHY;  Surgeon: Lorretta Harp, MD;  Location: [place];  Service: Cardiovascular;  Laterality: N/A;   Social History   Socioeconomic History  . Marital  status: Married    Spouse name: Everlene Farrier  . Number of children: 1  . Years of education: Not on file  . Highest education level: Not on file  Occupational History  . Occupation: Minister/carpenter/musician  Tobacco Use  . Smoking status: Former Research scientist (life sciences)  . Smokeless tobacco: Never Used  Vaping Use  . Vaping Use: Never used  Substance and Sexual Activity  . Alcohol use: Never  . Drug use: Never  . Sexual activity: Yes  Other Topics Concern  . Not on file  Social History Narrative  . Not on file   Social Determinants of Health   Financial Resource Strain:   . Difficulty of Paying Living Expenses: Not on file  Food Insecurity:   . Worried About [name] in the Last Year: Not on file  . Ran Out of Food in the Last Year: Not on file  Transportation Needs:   . Lack of Transportation (Medical): Not on file  . Lack of Transportation (Non-Medical): Not on file  Physical Activity:   . Days of Exercise per Week: Not on file  . Minutes of Exercise per Session: Not on file  Stress:   . Feeling of Stress : Not on  file  Social Connections:   . Frequency of Communication with Friends and Family: Not on file  . Frequency of Social Gatherings with Friends and Family: Not on file  . Attends Religious Services: Not on file  . Active Member of Clubs or Organizations: Not on file  . Attends Archivist Meetings: Not on file  . Marital Status: Not on file  Intimate Partner Violence:   . Fear of Current or Ex-Partner: Not on file  . Emotionally Abused: Not on file  . Physically Abused: Not on file  . Sexually Abused: Not on file   Current Outpatient Medications on File Prior to Visit  Medication Sig Dispense Refill  . amiodarone (PACERONE) 200 MG tablet Take 1 tablet (200 mg total) by mouth daily. 60 tablet 3  . atorvastatin (LIPITOR) 20 MG tablet Take 1 tablet (20 mg total) by mouth daily. (Patient not taking: Reported on 04/06/2020) 90 tablet 3  . dapagliflozin propanediol (FARXIGA) 10 MG TABS tablet Take 1 tablet (10 mg total) by mouth daily before breakfast. (Patient not taking: Reported on 04/06/2020) 90 tablet 0  . ENTRESTO 24-26 MG Take 1 tablet by mouth twice daily (Patient taking differently: Take 1 tablet by mouth 2 (two) times daily. ) 60 tablet 11  . glipiZIDE (GLUCOTROL) 10 MG tablet Take 1 tablet (10 mg total) by mouth 2 (two) times daily before a meal. 180 tablet 1  . loratadine (CLARITIN) 10 MG tablet Take 10 mg by mouth daily as needed (allergies). (Patient not taking: Reported on 04/06/2020)    . metFORMIN (GLUCOPHAGE) 1000 MG tablet Take 1 tablet (1,000 mg total) by mouth 2 (two) times daily with a meal. 180 tablet 1  . spironolactone (ALDACTONE) 25 MG tablet Take 1 tablet by mouth once daily (Patient taking differently: Take 25 mg by mouth daily. ) 90 tablet 0   No current facility-administered medications on file prior to visit.   Allergies  Allergen Reactions  . Coreg [Carvedilol] Other (See Comments)    Brings heart rate and blood pressure down severly   Family History   Problem Relation Age of Onset  . Hypertension Mother   . Heart attack Mother   . Hypertension Father   . Hypertension Sister   . Hypertension  Brother     PE: BP 110/82   Pulse 78   Ht 6\' 4"  (1.93 m)   Wt 239 lb 12.8 oz (108.8 kg)   SpO2 98%   BMI 29.19 kg/m  Wt Readings from Last 3 Encounters:  04/09/20 239 lb 12.8 oz (108.8 kg)  04/06/20 236 lb 3.2 oz (107.1 kg)  03/01/20 232 lb (105.2 kg)   Constitutional: overweight, in NAD, + deep voice, + frontal bossing, + enlarged jaw with widely spaced incisors, + enlarged lower lip, + prominent nasolabial folds Eyes: PERRLA, EOMI, no exophthalmos ENT: moist mucous membranes, no thyromegaly, no cervical lymphadenopathy Cardiovascular: RRR, No MRG Respiratory: CTA B Gastrointestinal: abdomen soft, NT, ND, BS+ Musculoskeletal: + Deformities (enlarged jaw, fingers, forehead), strength intact in all 4 Skin: moist, warm, no rashes, + skin tags on neck Neurological: no tremor with outstretched hands, DTR normal in all 4  ASSESSMENT: 1.  Acromegaly  2.  Pituitary macroadenoma  3. DM2, uncontrolled  PLAN:  1.and 2.  Patient with an approximately 20-year history of facial feature changes (we reviewed together picture from his wedding day >> dramatic change in his appearance since then).  He has all physical features of acromegaly (please see HPI and physical exam).  Also, he has cardiomegaly which can be a feature of acromegaly.  He also has uncontrolled diabetes and he is the only family member with this condition.  A growth hormone checked by [name] returned elevated.  We checked an IGF-I level along with growth hormone and these were both very high.  He also had high LH, FSH, and ACTH.  We checked a pituitary MRI (01/09/2020), and this showed a pituitary macroadenoma, measuring 2.5 cm, with suprasellar extension and abutting of the inferior optic chiasm.  Also, the tumor eroded the sella floor and extended in the right sphenoid  sinus. -We referred him to neurosurgery and he has transsphenoidal surgery planned by Dr. Marcello Moores 04/15/2020.  At that time, he will also have sinus septoplasty by Dr. Wilburn Cornelia (ENT).  -We again discussed about what surgery consists of and possible complications including hypopituitarism and DI. -Also given reading materials pituitary tumors (see HPI) -We discussed about what to expect after surgery in which of his symptoms and physical features will likely improve (for example excess sweating, joint pain) -We discussed about the possibility of remnant tumor, which may require somatostatin, pegvisomant, or gamma knife. -I will see the pt back in 4 weeks after the surgery  3. DM2 -We addressed this problem at last visit, when his HbA1c was very high, at 10.4%, only slightly improved from 11.8% in the past.  At that time, we discussed that we absolutely need to get his diabetes under control before his pituitary surgery.  I suggested addition of insulin (Basaglar, starting at 16 units daily). I explained how to inject this.   - However, at today's visit, he tells me that since last visit, he recently had an appointment with PCP to manage his diabetes.  He is not taking the insulin and also is not on the SGLT2 inhibitor (insurance did not approve Invokana), and his PCP switched him to [place].  Few days ago, PCP sent Wilder Glade to his pharmacy, and he will pick it up today. -Sugars at home are between 78 and 135, which is excellent.  There is a significant improvement from last visit. -At this visit, we discussed that I will defer further management of diabetes to PCP.  Philemon Kingdom, MD PhD [facility] Endocrinology

## 2020-04-09 NOTE — Patient Instructions (Addendum)
Please return to see me in ~4 weeks after the surgery.  [email or web address]

## 2020-04-13 ENCOUNTER — Encounter (HOSPITAL_COMMUNITY): Payer: Self-pay

## 2020-04-13 ENCOUNTER — Other Ambulatory Visit (HOSPITAL_COMMUNITY): Payer: BC Managed Care – PPO

## 2020-04-13 ENCOUNTER — Inpatient Hospital Stay (HOSPITAL_COMMUNITY): Admission: RE | Admit: 2020-04-13 | Payer: BC Managed Care – PPO | Source: Ambulatory Visit

## 2020-04-13 ENCOUNTER — Ambulatory Visit (HOSPITAL_COMMUNITY): Admission: RE | Admit: 2020-04-13 | Payer: BC Managed Care – PPO | Source: Ambulatory Visit

## 2020-04-15 ENCOUNTER — Telehealth: Payer: Self-pay | Admitting: Nurse Practitioner

## 2020-04-15 ENCOUNTER — Inpatient Hospital Stay (HOSPITAL_COMMUNITY): Admission: RE | Admit: 2020-04-15 | Payer: BC Managed Care – PPO | Source: Home / Self Care

## 2020-04-15 ENCOUNTER — Encounter (HOSPITAL_COMMUNITY): Admission: RE | Payer: Self-pay | Source: Home / Self Care

## 2020-04-15 SURGERY — CRANIOTOMY HYPOPHYSECTOMY TRANSNASAL APPROACH
Anesthesia: General

## 2020-04-15 NOTE — Telephone Encounter (Addendum)
Pt wife is calling to notify zelda her husband test positive for covid on nov 2 and that he is quarantining. Pt is having symptoms. Pt wife did not elaborate however the whole house is quarantining

## 2020-04-19 ENCOUNTER — Other Ambulatory Visit: Payer: Self-pay | Admitting: Family Medicine

## 2020-04-19 NOTE — Telephone Encounter (Signed)
Spoke to patient. Pt. Stated he is feeling much better.  Pt. Stated he is taking OTC NyQuil to help him sleep. He stated he no longer have a fever, just feeling tired, weak, and cough. Pt. Was informed to go to emergency department if he start to have shortness of breath or pain. Pt. Understood.

## 2020-04-19 NOTE — Telephone Encounter (Signed)
Requested Prescriptions  Pending Prescriptions Disp Refills  . spironolactone (ALDACTONE) 25 MG tablet [Pharmacy Med Name: Spironolactone 25 MG Oral Tablet] 90 tablet 0    Sig: Take 1 tablet by mouth once daily     There is no refill protocol information for this order

## 2020-05-19 NOTE — Progress Notes (Signed)
Your procedure is scheduled on Wednesday, May 26, 2020.  Report to [facility] Main Entrance "A" at 6:30 A.M., and check in at the Admitting office.  Call this number if you have problems the morning of surgery:  [phone]  Call [phone] if you have any questions prior to your surgery date Monday-Friday 8am-4pm    Remember:  Do not eat or drink after midnight the night before your surgery    Take these medicines the morning of surgery with A SIP OF WATER:  amiodarone (PACERONE) atorvastatin (LIPITOR) ENTRESTO  loratadine (CLARITIN)   As of today, STOP taking any Aspirin (unless otherwise instructed by your surgeon) Aleve, Naproxen, Ibuprofen, Motrin, Advil, Goody's, BC's, all herbal medications, fish oil, and all vitamins.    WHAT DO I DO ABOUT MY DIABETES MEDICATION?   Marland Kitchen Do not take oral diabetes medicines (metFORMIN (GLUCOPHAGE) ) the morning of surgery.  . Do not take dapagliflozin propanediol (FARXIGA) the day before surgery OR the morning of surgery.  . Do not take glipiZIDE (GLUCOTROL) the night before surgery OR the morning of surgery.   HOW TO MANAGE YOUR DIABETES BEFORE AND AFTER SURGERY  Why is it important to control my blood sugar before and after surgery? . Improving blood sugar levels before and after surgery helps healing and can limit problems. . A way of improving blood sugar control is eating a healthy diet by: o  Eating less sugar and carbohydrates o  Increasing activity/exercise o  Talking with your doctor about reaching your blood sugar goals . High blood sugars (greater than 180 mg/dL) can raise your risk of infections and slow your recovery, so you will need to focus on controlling your diabetes during the weeks before surgery. . Make sure that the doctor who takes care of your diabetes knows about your planned surgery including the date and location.  How do I manage my blood sugar before surgery? . Check your blood sugar at least 4  times a day, starting 2 days before surgery, to make sure that the level is not too high or low. . Check your blood sugar the morning of your surgery when you wake up and every 2 hours until you get to the Short Stay unit. o If your blood sugar is less than 70 mg/dL, you will need to treat for low blood sugar: - Do not take insulin. - Treat a low blood sugar (less than 70 mg/dL) with  cup of clear juice (cranberry or apple), 4 glucose tablets, OR glucose gel. - Recheck blood sugar in 15 minutes after treatment (to make sure it is greater than 70 mg/dL). If your blood sugar is not greater than 70 mg/dL on recheck, call [phone] for further instructions. . Report your blood sugar to the short stay nurse when you get to Short Stay.  . If you are admitted to the hospital after surgery: o Your blood sugar will be checked by the staff and you will probably be given insulin after surgery (instead of oral diabetes medicines) to make sure you have good blood sugar levels. o The goal for blood sugar control after surgery is 80-180 mg/dL.                      Do not wear jewelry.            Do not wear lotions, powders, colognes, or deodorant.            Men may shave face and  neck.            Do not bring valuables to the hospital.            [facility] is not responsible for any belongings or valuables.  Do NOT Smoke (Tobacco/Vaping) or drink Alcohol 24 hours prior to your procedure If you use a CPAP at night, you may bring all equipment for your overnight stay.   Contacts, glasses, dentures or bridgework may not be worn into surgery.      For patients admitted to the hospital, discharge time will be determined by your treatment team.   Patients discharged the day of surgery will not be allowed to drive home, and someone needs to stay with them for 24 hours.    Special instructions:   [facility]- Preparing For Surgery  Before surgery, you can play an important role. Because skin is not  sterile, your skin needs to be as free of germs as possible. You can reduce the number of germs on your skin by washing with CHG (chlorahexidine gluconate) Soap before surgery.  CHG is an antiseptic cleaner which kills germs and bonds with the skin to continue killing germs even after washing.    Oral Hygiene is also important to reduce your risk of infection.  Remember - BRUSH YOUR TEETH THE MORNING OF SURGERY WITH YOUR REGULAR TOOTHPASTE  Please do not use if you have an allergy to CHG or antibacterial soaps. If your skin becomes reddened/irritated stop using the CHG.  Do not shave (including legs and underarms) for at least 48 hours prior to first CHG shower. It is OK to shave your face.  Please follow these instructions carefully.   1. Shower the NIGHT BEFORE SURGERY and the MORNING OF SURGERY with CHG Soap.   2. If you chose to wash your hair, wash your hair first as usual with your normal shampoo.  3. After you shampoo, rinse your hair and body thoroughly to remove the shampoo.  4. Use CHG as you would any other liquid soap. You can apply CHG directly to the skin and wash gently with a scrungie or a clean washcloth.   5. Apply the CHG Soap to your body ONLY FROM THE NECK DOWN.  Do not use on open wounds or open sores. Avoid contact with your eyes, ears, mouth and genitals (private parts). Wash Face and genitals (private parts)  with your normal soap.   6. Wash thoroughly, paying special attention to the area where your surgery will be performed.  7. Thoroughly rinse your body with warm water from the neck down.  8. DO NOT shower/wash with your normal soap after using and rinsing off the CHG Soap.  9. Pat yourself dry with a CLEAN TOWEL.  10. Wear CLEAN PAJAMAS to bed the night before surgery  11. Place CLEAN SHEETS on your bed the night of your first shower and DO NOT SLEEP WITH PETS.   Day of Surgery: Wear Clean/Comfortable clothing the morning of surgery Do not apply any  deodorants/lotions.   Remember to brush your teeth WITH YOUR REGULAR TOOTHPASTE.   Please read over the following fact sheets that you were given.

## 2020-05-20 ENCOUNTER — Other Ambulatory Visit: Payer: Self-pay

## 2020-05-20 ENCOUNTER — Encounter (HOSPITAL_COMMUNITY)
Admission: RE | Admit: 2020-05-20 | Discharge: 2020-05-20 | Disposition: A | Discharge status: Home / Self Care | Payer: BC Managed Care – PPO | Source: Ambulatory Visit | Attending: Neurosurgery | Admitting: Neurosurgery

## 2020-05-20 ENCOUNTER — Encounter (HOSPITAL_COMMUNITY): Payer: Self-pay

## 2020-05-20 DIAGNOSIS — Z01812 Encounter for preprocedural laboratory examination: Secondary | ICD-10-CM | POA: Insufficient documentation

## 2020-05-20 HISTORY — DX: Benign neoplasm of pituitary gland: D35.2

## 2020-05-20 HISTORY — DX: Other cardiomyopathies: I42.8

## 2020-05-20 HISTORY — DX: Acromegaly and pituitary gigantism: E22.0

## 2020-05-20 LAB — SURGICAL PCR SCREEN
MRSA, PCR: NEGATIVE
Staphylococcus aureus: NEGATIVE

## 2020-05-20 LAB — BASIC METABOLIC PANEL
Anion gap: 12 (ref 5–15)
BUN: 18 mg/dL (ref 6–20)
CO2: 28 mmol/L (ref 22–32)
Calcium: 10.2 mg/dL (ref 8.9–10.3)
Chloride: 98 mmol/L (ref 98–111)
Creatinine, Ser: 1.12 mg/dL (ref 0.61–1.24)
GFR, Estimated: >60 mL/min (ref >60)
Glucose, Bld: 306 mg/dL — ABNORMAL HIGH (ref 70–99)
Potassium: 4.7 mmol/L (ref 3.5–5.1)
Sodium: 138 mmol/L (ref 135–145)

## 2020-05-20 LAB — CBC
HCT: 47.9 % (ref 39.0–52.0)
Hemoglobin: 15.8 g/dL (ref 13.0–17.0)
MCH: 31 pg (ref 26.0–34.0)
MCHC: 33 g/dL (ref 30.0–36.0)
MCV: 94.1 fL (ref 80.0–100.0)
Platelets: 175 10*3/uL (ref 150–400)
RBC: 5.09 MIL/uL (ref 4.22–5.81)
RDW: 14.1 % (ref 11.5–15.5)
WBC: 5.6 10*3/uL (ref 4.0–10.5)
nRBC: 0 % (ref 0.0–0.2)

## 2020-05-20 LAB — HEMOGLOBIN A1C
Hgb A1c MFr Bld: 9 % — ABNORMAL HIGH (ref 4.8–5.6)
Mean Plasma Glucose: 211.6 mg/dL

## 2020-05-20 LAB — GLUCOSE, CAPILLARY: Glucose-Capillary: 350 mg/dL — ABNORMAL HIGH (ref 70–99)

## 2020-05-20 NOTE — Progress Notes (Signed)
PCP - Geryl Rankins, NP Cardiologist - Dr. Glori Bickers Endocrinologist - Dr. Philemon Kingdom  PPM/ICD - denies  Chest x-ray - N/A EKG - 12/01/2019 Stress Test - denies ECHO - 09/03/2019 Cardiac Cath - 12/16/2018  Sleep Study - denies CPAP - N/A  Fasting Blood Sugar - 140 - 210 Checks Blood Sugar 2 times a day  Blood Thinner Instructions: N/A Aspirin Instructions: N/A  ERAS Protcol - No PRE-SURGERY Ensure or G2-   COVID TEST- Per patient tested positive on 04/13/2020. No results in epic. Patient stated testing was done outside at the  hospital in [place], [place]. Patient unable to provide results at time of PAT appointment. Patient instructed to try and see if he can get copy of results from where test was taken. Also, labs have been requested from [facility]. Patient also instructed to call the health department to see if they have records of recent testing. If results not available by day of surgery, patient to be retested. Patient made aware and verbalized understanding. Royann Shivers., RN notified as well.  Anesthesia review: Yes, cardiac hx, cardiac clearance, CBG 350 @PAT  appointment Patient stated, has appointment with cardiologist tomorrow 05/21/2020  Patient denies shortness of breath, fever, cough and chest pain at PAT appointment  All instructions explained to the patient, with a verbal understanding of the material. Patient agrees to go over the instructions while at home for a better understanding. Patient also instructed to self quarantine after being tested for COVID-19. The opportunity to ask questions was provided.

## 2020-05-21 ENCOUNTER — Encounter (HOSPITAL_COMMUNITY): Payer: Self-pay | Admitting: Internal Medicine

## 2020-05-21 ENCOUNTER — Encounter (HOSPITAL_COMMUNITY): Payer: Self-pay

## 2020-05-21 ENCOUNTER — Ambulatory Visit (HOSPITAL_COMMUNITY)
Admission: RE | Admit: 2020-05-21 | Discharge: 2020-05-21 | Disposition: A | Discharge status: Home / Self Care | Payer: BC Managed Care – PPO | Source: Ambulatory Visit | Attending: Internal Medicine | Admitting: Internal Medicine

## 2020-05-21 VITALS — BP 132/82 | HR 79 | Wt 234.8 lb

## 2020-05-21 DIAGNOSIS — D497 Neoplasm of unspecified behavior of endocrine glands and other parts of nervous system: Secondary | ICD-10-CM

## 2020-05-21 DIAGNOSIS — I110 Hypertensive heart disease with heart failure: Secondary | ICD-10-CM | POA: Insufficient documentation

## 2020-05-21 DIAGNOSIS — Z87891 Personal history of nicotine dependence: Secondary | ICD-10-CM | POA: Diagnosis not present

## 2020-05-21 DIAGNOSIS — Z79899 Other long term (current) drug therapy: Secondary | ICD-10-CM | POA: Insufficient documentation

## 2020-05-21 DIAGNOSIS — I428 Other cardiomyopathies: Secondary | ICD-10-CM | POA: Diagnosis not present

## 2020-05-21 DIAGNOSIS — I447 Left bundle-branch block, unspecified: Secondary | ICD-10-CM | POA: Insufficient documentation

## 2020-05-21 DIAGNOSIS — E1165 Type 2 diabetes mellitus with hyperglycemia: Secondary | ICD-10-CM | POA: Insufficient documentation

## 2020-05-21 DIAGNOSIS — Z7984 Long term (current) use of oral hypoglycemic drugs: Secondary | ICD-10-CM | POA: Insufficient documentation

## 2020-05-21 DIAGNOSIS — I5022 Chronic systolic (congestive) heart failure: Secondary | ICD-10-CM | POA: Insufficient documentation

## 2020-05-21 DIAGNOSIS — I493 Ventricular premature depolarization: Secondary | ICD-10-CM | POA: Diagnosis not present

## 2020-05-21 HISTORY — DX: Heart failure, unspecified: I50.9

## 2020-05-21 NOTE — Progress Notes (Addendum)
ADVANCED HF CLINIC NOTE  HPI:  [name] is a 54 year old male with a history of HTN and poorly controlled diabetes who was admitted to [facility] from 7/5-7/20/20 with hypertensive urgency and acute systolic HF. ECG showed LBBB of uncertain duration.  He was admitted to the ICU and placed on IV NTG and underwent diuresis. Echocardiogram showed severe systolic dysfunction with EF 25-30%. Cardiac catheterization on 12/16/18 showed normal coronary arteries with well compensated filling pressures after diuresis.   RA = 4 RV = 34/3 PA = 31/8 (19) PCWP =  11 Fick = 5.1/2.24  He was started on GDMT including carvedilol 6.25 bid, Entresto 49/51 bid, spironolactone 12.5 daily. He did well for several days but then developed severe fatigue. I saw him in Clinic on 12/27/18. He was in bigeminy. I stopped his b-blocker and started amio 200 bid.K 5.0 Mg 1.7.  LifeVest placed.  Remains on amio to suppress PVCs. 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 Following with Dr. Renne Hampton. Working with Surgery and ENT for pituitary surgery.   Here for f/u with his wife. Following BP closely SBP 120-130. Says he feels good but not as good as when he was younger. Says the heat bothers him. Can walk and do general stuff around the house without too much difficulty. No edema, orthopnea or PND. At last visit increased Entresto to 49/51 but made hem feel "loopy" so he stopped. He was on Jardiance but PCP switch to Invokana due to cost.   Here for f/u. Feels good today. SOB with heavy lifting and walking far distances but otherwise no resting symtpoms. No dizziness, CP, palpitations, edema, orthopnea/PND. BPs at home ~120-130s. Taking all medications, PCP switched patient to [name] recently. No GU symtpoms.  Having transphenoid surgery & sinus septoplasty on 12/15 at [facility].    cMRI 10/21/19 1. Very difficult study due to frequent ectopy and respiratory artifact. 2. Moderately dilated left ventricle with estimated EF  20-25% (unable to quantify due to respiratory artifact and ectopy). Diffuse hypokinesis with basal to mid inferolateral akinesis. 3.  Normal RV size and systolic function. 4. Delayed enhancement images were also very difficult. However, there was clearly a dense basal to mid inferolateral scar. This looked most consistent with a prior MI in the LCx territory.  Echo 9/21: EF 30-35%, RV ok.  Echo 09/03/19: EF 25% RV ok. Personally reviewed  Echo 11/19 EF ~35-40% with severe dyssynchrony due to LBBB and PVCs Personally reviewed  Zio patch 10/20: 4.8%PVCs  SH:  Social History   Socioeconomic History  . Marital status: Married    Spouse name: Kevin Hampton  . Number of children: 1  . Years of education: Not on file  . Highest education level: Not on file  Occupational History  . Occupation: Minister/carpenter/musician  Tobacco Use  . Smoking status: Former Research scientist (life sciences)  . Smokeless tobacco: Never Used  Vaping Use  . Vaping Use: Never used  Substance and Sexual Activity  . Alcohol use: Never  . Drug use: Never  . Sexual activity: Yes  Other Topics Concern  . Not on file  Social History Narrative  . Not on file   Social Determinants of Health   Financial Resource Strain: Not on file  Food Insecurity: Not on file  Transportation Needs: Not on file  Physical Activity: Not on file  Stress: Not on file  Social Connections: Not on file  Intimate Partner Violence: Not on file    FH:  Family  History  Problem Relation Age of Onset  . Hypertension Mother   . Heart attack Mother   . Hypertension Father   . Hypertension Sister   . Hypertension Brother     Past Medical History:  Diagnosis Date  . CHF (congestive heart failure) ([place])   . Essential hypertension   . Hypertensive emergency 12/15/2018  . Obesity   . Systolic heart failure ([place])   . Type 2 diabetes mellitus with complication [facility])     Current Outpatient Medications  Medication Sig Dispense Refill  . amiodarone  (PACERONE) 200 MG tablet Take 1 tablet (200 mg total) by mouth daily. 60 tablet 3  . dapagliflozin propanediol (FARXIGA) 10 MG TABS tablet Take 1 tablet (10 mg total) by mouth daily before breakfast. 90 tablet 0  . ENTRESTO 24-26 MG Take 1 tablet by mouth twice daily 60 tablet 11  . glipiZIDE (GLUCOTROL) 10 MG tablet Take 1 tablet (10 mg total) by mouth 2 (two) times daily before a meal. 180 tablet 1  . loratadine (CLARITIN) 10 MG tablet Take 10 mg by mouth daily as needed (allergies).     . metFORMIN (GLUCOPHAGE) 1000 MG tablet Take 1 tablet (1,000 mg total) by mouth 2 (two) times daily with a meal. 180 tablet 1  . spironolactone (ALDACTONE) 25 MG tablet Take 1 tablet by mouth once daily 90 tablet 0   No current facility-administered medications for this encounter.    Vitals:   05/21/20 0911  BP: 132/82  Pulse: 79  SpO2: 98%  Weight: 106.5 kg (234 lb 12.8 oz)   Wt Readings from Last 3 Encounters:  05/21/20 106.5 kg (234 lb 12.8 oz)  05/20/20 106.3 kg (234 lb 6.4 oz)  04/09/20 108.8 kg (239 lb 12.8 oz)    PHYSICAL EXAM: General:  Well appearing. No resp difficulty HEENT: coarse features  Neck: supple. no JVD. Carotids 2+ bilat; no bruits. No lymphadenopathy or thryomegaly appreciated. Cor: PMI nondisplaced. Regular rate & rhythm. No rubs, gallops or murmurs. Lungs: clear Abdomen: soft, nontender, nondistended. No hepatosplenomegaly. No bruits or masses. Good bowel sounds. Extremities: no cyanosis, clubbing, rash, edema Neuro: alert & orientedx3, cranial nerves grossly intact. moves all 4 extremities w/o difficulty. Affect pleasant  ECG: SR 81 bpm, LBBB (personally reviewed)  ASSESSMENT & PLAN:  1. Chronic systolic HF - diagnosed 5/28 EF ~25-30%. NICM - cath 7/20 with normal coronaries and compensated hemodynamics - suspect he likely has LBBB +/- PVC-induced CM  - Echo 09/03/19 EF ~25% with severe LV dyssynchrony (no improvement with PVC suppression) - Zio patch 10/20 with  4.8% PVC  - cMRI 5/21 EF 20-25% frequent ectopy. RV normal. Inferolateral scar - cMRI 5/21 suggestive of PVC/LBBB CM but presence of ? Inferolateral scar is puzzling.  - Unclear if cardiomyopathy related to PVCs, LBBB (or both). EF initially did not get better with PVC suppression.  - Seen by EP for consideration of CRT, felt due to lack of significant symptoms and no activity limitations, low risk for cardiac arrest and would defer device at this time pending ongoing EF improvement. If EF not improving, will ask EP to re-evaluate CRT.  - Echo 9/21 EF 30-35%, severe decrease LV function with dyssynchrony, RV normal - NYHA II, volume stable on exam. - Continue Entresto 24/26 mg BID (did not tolerate 49/51 dose, consider re-challenge after surgery) - Continue amio 200 mg daily for PVC suppression   - Continue spiro 25 mg daily. - Continue Farxiga 10 mg daily. - He remains  unwilling to retry b-blockers because of how bad he felt in past. He may reconside after surgery.  - Repeat echo in 2 months  2. Frequent PVCs - Suppressed with amio. None on ECG today, no palpitations. - Continue amio 100 mg daily - aware need for yearly eye exams - zio 10/20 4.8%  - Can consider switch to mexilitene as needed.  - home sleep study with AHI 3. (No significant OSA)  3. HTN - Blood pressure well controlled. Continue current regimen. - continue home BP checks.  4. LBBB - discussion as above. Has seen EP and CRT consideration deferred.   5. Poorly controlled DM2 - Per his PCP. HgBA1c was 11.8 on 6/21 - Continue Farxiga - Hgba1c improving  6. Acromegaly - GH level elevated - has growth hormone excess - Scheduled for transphenoid pituitary resection surgery & sinus septoplasty on 12/15 at [facility].  - he is at low to moderate risk for CV complications. Can proceed without further testing.   Glori Bickers, MD  9:17 AM

## 2020-05-21 NOTE — Progress Notes (Addendum)
Anesthesia Chart Review:  Case: [id number] Date/Time: 05/26/20 0815   Procedures:      ENDOSCOPIC TRANSPHENOIDAL RESECTION OF TUMOR, POSS FAT/FACIA GRAFT (N/A )     PLACEMENT OF LUMBAR DRAIN (N/A )     TRANSPHENOIDAL APPROACH EXPOSURE (N/A )   Anesthesia type: General   Pre-op diagnosis: FUNCTIONING PITUITARY NEOPLASM   Location: [facility] OR ROOM 21 / [facility] OR   Surgeons: Vallarie Mare, MD; Jerrell [place], MD      DISCUSSION:  Patient is a 54 year old male scheduled for the above procedure. It appears surgery was initially planned for 04/15/20, but he tested positive for COVID-19 (on 04/12/20 by [facility] notes).   History includes former smoker, DM2, HTN, chronic systolic CHF (diagnosed 09/1738), non-ischemic cardiomyopathy (normal coronaries 12/2018, although LCX territory scar on 10/21/19 cMRI; likely LBBB +/- PVC induced CM), LBBB, acromegaly (diagnosed ~  02/2020), pituitary macroadenoma (01/09/20 MRI).   He was evaluated by [name] on 05/21/20. Volume stable. He will consider adding low dose b-blocker after surgery. Repeat echo in 2 months is planned. Continue Amiodarone to suppress PVCs. He is aware of surgery plans, and preliminary note states, "he is at low to moderate risk for CV complications. Can proceed without further testing."  Last visit with Dr. Cruzita Lederer on 04/09/20. He was started on insulin for uncontrolled DM in 02/2020, but he never started. He was on oral medication and adjusted his diet. Reported his sugars had improved to 78-135.  He is planning to have his PCP manages DM.  He was preparing for pituitary surgery.  She plans to see him 4 weeks after surgery.   Preoperative labs showed non-fasting glucose 306 with A1c 9.0 (down from 10.4 on 03/01/20 and 11.8 on 12/05/19). I left a message with Lexine Baton at Dr. Manon Hilding office and with Dr. Victorio Palm MA regarding this. He will get a fasting CBG on arrival.   Anesthesia team to evaluate on the day of surgery.   UPDATE  05/24/2020 5:05 PM.  Received 04/13/20 positive COVID-19 yest from [facility], so he should not need to be retested on the day of surgery.   VS: BP (!) 142/94   Pulse 86   Temp 36.9 C (Oral)   Ht 6\' 4"  (1.93 m)   Wt 106.3 kg   SpO2 100%   BMI 28.53 kg/m     PROVIDERS: Gildardo Pounds, NP is PCP  - Glori Bickers, MD is HF cardiologist - Virl Axe, MD is EP cardiologist. Visit on 12/01/19 for consideration of CRT, but due to lack of lack of significant symptoms and no activity limitations, he was felt low risk for cardiac arrest. ICD and resynchronization therapy not felt indicated at that time.  Philemon Kingdom, MD is endocrinologist   LABS: Preoperative labs noted. See DISCUSSION. (all labs ordered are listed, but only abnormal results are displayed)  Labs Reviewed  GLUCOSE, CAPILLARY - Abnormal; Notable for the following components:      Result Value   Glucose-Capillary 350 (*)    All other components within normal limits  HEMOGLOBIN A1C - Abnormal; Notable for the following components:   Hgb A1c MFr Bld 9.0 (*)    All other components within normal limits  BASIC METABOLIC PANEL - Abnormal; Notable for the following components:   Glucose, Bld 306 (*)    All other components within normal limits  SURGICAL PCR SCREEN  CBC     IMAGES: CT Paranasal Sinuses 03/17/20 [facility]): IMPRESSION:  Unusual extension of tumor through the floor of the expanded sella  into a bone covered diverticulum-like area that protrudes into the  sphenoid sinus along the septum dividing the right division from the  left division.   MRI Brain 01/09/20: IMPRESSION: - 2.5 cm macroadenoma with suprasellar extension and abutment of the inferior optic chiasm. Mild splaying of the pre-chiasmatic optic nerves. - Sella floor defect with extension of tumor into the right sphenoid sinus. - Bilateral cavernous sinus involvement without significant encasement or luminal  narrowing of the intracavernous ICAs. - Nonspecific supratentorial white matter T2 hyperintensities. Differential includes minimal chronic microvascular ischemic changes versus post infectious/inflammatory sequela. - Osseous prominence of the bilateral supraorbital margin, anterior maxillary walls and mandibular condyles likely reflects sequela of acromegaly.   EKG: 05/21/20 ([facility]) Normal sinus rhythm Left bundle branch block Abnormal EKG - Known LBBB   CV: Echo 02/25/20: IMPRESSIONS  1. Left ventricular ejection fraction, by estimation, is 30 to 35%. The  left ventricle has severely decreased function. The left ventricle  demonstrates global hypokinesis. Left ventricular diastolic parameters are  indeterminate. There is Dyssynchrony  due to LBBB.  2. Right ventricular systolic function is normal. The right ventricular  size is normal. Tricuspid regurgitation signal is inadequate for assessing  PA pressure.  3. The mitral valve is grossly normal. Trivial mitral valve  regurgitation. No evidence of mitral stenosis.  4. The aortic valve is tricuspid. There is mild thickening of the aortic  valve. Aortic valve regurgitation is trivial. No aortic stenosis is  present.  5. The inferior vena cava is normal in size with greater than 50%  respiratory variability, suggesting right atrial pressure of 3 mmHg.  - Comparison(s): A prior study was performed on 09/03/19. No significant  change from prior study. Prior images reviewed side by side. There may be  a slight improvement in EF assessment due to less ectopy.   cMRI 10/21/19: Impression: 1. Very difficult study due to frequent ectopy and respiratory artifact. 2. Moderately dilated left ventricle with estimated EF 20-25% (unable to quantify due to respiratory artifact and ectopy). Diffuse hypokinesis with basal to mid inferolateral akinesis. 3. Normal RV size and systolic function. 4. Delayed enhancement images  were also very difficult. However, there was clearly a dense basal to mid inferolateral scar. This looked most consistent with a prior MI in the LCx territory.   Long term event monitor 04/03/19-04/17/19: Study Highlights 1. Sinus rhythm with bundle branch block avg HR of 75 bpm. 2. Four runs NSVT - longest 8 beats  3. Thirty eight runs of SVT - the longest lasting 7 beats with an avg rate of 114 bpm. 4. Occasional PVCs 4.8%   RHC/LHC 12/16/18: Right atrial pressure- 4/2 Right ventricular pressure- 31/1 Pulmonary artery pressure- 31/8, mean 19 Pulmonary wedge pressure- A-wave 14, V wave 12, mean 11 LVEDP- 17 Cardiac output-5.1 L/min with an index of 2.24 L/min/m IMPRESSION: Mr. Ozga has clean coronary arteries with [place] slightly depressed cardiac output and fairly normal filling pressures probably as result of diuresis.  He will need treatment for nonischemic cardiomyopathy with Entresto, spironolactone, and carvedilol.    Past Medical History:  Diagnosis Date  . Acromegaly ([place])   . CHF (congestive heart failure) ([place])   . Essential hypertension   . Hypertensive emergency 12/15/2018  . Nonischemic cardiomyopathy ([place])   . Obesity   . Pituitary macroadenoma ([place])   . Systolic heart failure ([place])   . Type 2 diabetes mellitus with complication ([facility])  Past Surgical History:  Procedure Laterality Date  . RIGHT/LEFT HEART CATH AND CORONARY ANGIOGRAPHY N/A 12/16/2018   Procedure: RIGHT/LEFT HEART CATH AND CORONARY ANGIOGRAPHY;  Surgeon: Lorretta Harp, MD;  Location: [facility];  Service: Cardiovascular;  Laterality: N/A;    MEDICATIONS: . amiodarone (PACERONE) 200 MG tablet  . dapagliflozin propanediol (FARXIGA) 10 MG TABS tablet  . ENTRESTO 24-26 MG  . glipiZIDE (GLUCOTROL) 10 MG tablet  . loratadine (CLARITIN) 10 MG tablet  . metFORMIN (GLUCOPHAGE) 1000 MG tablet  . spironolactone (ALDACTONE) 25 MG tablet   No current facility-administered medications for this  encounter.    Myra Gianotti, PA-C Surgical Short Stay/Anesthesiology [facility] Phone [phone] [facility] Phone [phone] 05/21/2020 6:17 PM

## 2020-05-21 NOTE — Patient Instructions (Signed)
It was great to see you today! No medication changes are needed at this time.   Your physician has requested that you have an echocardiogram. Echocardiography is a painless test that uses sound waves to create images of your heart. It provides your doctor with information about the size and shape of your heart and how well your heart's chambers and valves are working. This procedure takes approximately one hour. There are no restrictions for this procedure.  Your physician recommends that you schedule a follow-up appointment in: 2-3 months with Dr Haroldine Laws  If you have any questions or concerns before your next appointment please send [place] a message through [place] or call our office at [phone].    TO LEAVE A MESSAGE FOR THE NURSE SELECT OPTION 2, PLEASE LEAVE A MESSAGE INCLUDING: . YOUR NAME . DATE OF BIRTH . CALL BACK NUMBER . REASON FOR CALL**this is important as we prioritize the call backs  YOU WILL RECEIVE A CALL BACK THE SAME DAY AS LONG AS YOU CALL BEFORE 4:00 PM

## 2020-05-25 ENCOUNTER — Encounter (HOSPITAL_COMMUNITY): Payer: Self-pay | Admitting: Certified Registered Nurse Anesthetist

## 2020-05-25 NOTE — Anesthesia Preprocedure Evaluation (Addendum)
Anesthesia Evaluation  Patient identified by MRN, date of birth, ID band Patient awake    Reviewed: Allergy & Precautions, NPO status , Patient's Chart, lab work & pertinent test results  Airway Mallampati: II  TM Distance: >3 FB Neck ROM: Full    Dental  (+) Teeth Intact   Pulmonary former smoker,    Pulmonary exam normal breath sounds clear to auscultation       Cardiovascular hypertension, Pt. on medications +CHF  + dysrhythmias Atrial Fibrillation  Rhythm:Irregular Rate:Abnormal     Neuro/Psych Pituitary neoplasm Acromegaly     GI/Hepatic negative GI ROS, Neg liver ROS,   Endo/Other  diabetes, Type 2, Oral Hypoglycemic Agents  Renal/GU negative Renal ROS     Musculoskeletal negative musculoskeletal ROS (+)   Abdominal   Peds  Hematology negative hematology ROS (+)   Anesthesia Other Findings   Reproductive/Obstetrics                            Anesthesia Physical Anesthesia Plan  ASA: III  Anesthesia Plan: General   Post-op Pain Management:    Induction: Intravenous  PONV Risk Score and Plan: 3 and Midazolam, Dexamethasone and Ondansetron  Airway Management Planned: Oral ETT  Additional Equipment: Arterial line  Intra-op Plan:   Post-operative Plan: Extubation in OR  Informed Consent: I have reviewed the patients History and Physical, chart, labs and discussed the procedure including the risks, benefits and alternatives for the proposed anesthesia with the patient or authorized representative who has indicated his/her understanding and acceptance.       Plan Discussed with: CRNA  Anesthesia Plan Comments: (2nd PIV)       Anesthesia Quick Evaluation

## 2020-05-25 NOTE — Progress Notes (Signed)
Called patient to discuss covid test results from November.  Patient states that he has a copy of his covid test results and will bring them the DOS.    Patient also mentioned that he was supposed to have a CT prior to the procedure but did not have that done.  Notified Nikki at Dr. Marcello Moores' office who stated that she will follow-up on the CT.

## 2020-05-26 ENCOUNTER — Other Ambulatory Visit: Payer: Self-pay

## 2020-05-26 ENCOUNTER — Encounter (HOSPITAL_COMMUNITY): Payer: Self-pay

## 2020-05-26 ENCOUNTER — Inpatient Hospital Stay (HOSPITAL_COMMUNITY): Payer: BC Managed Care – PPO | Admitting: Physician Assistant

## 2020-05-26 ENCOUNTER — Inpatient Hospital Stay (HOSPITAL_COMMUNITY): Payer: BC Managed Care – PPO | Admitting: Certified Registered Nurse Anesthetist

## 2020-05-26 ENCOUNTER — Inpatient Hospital Stay (HOSPITAL_COMMUNITY)
Admission: RE | Admit: 2020-05-26 | Discharge: 2020-05-28 | DRG: 614 | Disposition: A | Discharge status: Home / Self Care | Payer: BC Managed Care – PPO | Attending: Neurosurgery | Admitting: Neurosurgery

## 2020-05-26 ENCOUNTER — Encounter (HOSPITAL_COMMUNITY)
Admission: RE | Disposition: A | Discharge status: Home / Self Care | Payer: Self-pay | Source: Home / Self Care | Attending: Neurosurgery

## 2020-05-26 DIAGNOSIS — I5022 Chronic systolic (congestive) heart failure: Secondary | ICD-10-CM | POA: Diagnosis present

## 2020-05-26 DIAGNOSIS — Z79899 Other long term (current) drug therapy: Secondary | ICD-10-CM | POA: Diagnosis not present

## 2020-05-26 DIAGNOSIS — J323 Chronic sphenoidal sinusitis: Secondary | ICD-10-CM | POA: Diagnosis not present

## 2020-05-26 DIAGNOSIS — Z6825 Body mass index (BMI) 25.0-25.9, adult: Secondary | ICD-10-CM | POA: Diagnosis not present

## 2020-05-26 DIAGNOSIS — E118 Type 2 diabetes mellitus with unspecified complications: Secondary | ICD-10-CM | POA: Diagnosis present

## 2020-05-26 DIAGNOSIS — D352 Benign neoplasm of pituitary gland: Secondary | ICD-10-CM | POA: Diagnosis not present

## 2020-05-26 DIAGNOSIS — I110 Hypertensive heart disease with heart failure: Secondary | ICD-10-CM | POA: Diagnosis not present

## 2020-05-26 DIAGNOSIS — E669 Obesity, unspecified: Secondary | ICD-10-CM | POA: Diagnosis not present

## 2020-05-26 DIAGNOSIS — I428 Other cardiomyopathies: Secondary | ICD-10-CM | POA: Diagnosis present

## 2020-05-26 DIAGNOSIS — Z87891 Personal history of nicotine dependence: Secondary | ICD-10-CM | POA: Diagnosis not present

## 2020-05-26 DIAGNOSIS — E893 Postprocedural hypopituitarism: Secondary | ICD-10-CM

## 2020-05-26 DIAGNOSIS — E119 Type 2 diabetes mellitus without complications: Secondary | ICD-10-CM | POA: Diagnosis not present

## 2020-05-26 DIAGNOSIS — E220 Acromegaly and pituitary gigantism: Secondary | ICD-10-CM | POA: Diagnosis present

## 2020-05-26 DIAGNOSIS — J342 Deviated nasal septum: Secondary | ICD-10-CM | POA: Diagnosis not present

## 2020-05-26 DIAGNOSIS — Z7984 Long term (current) use of oral hypoglycemic drugs: Secondary | ICD-10-CM | POA: Diagnosis not present

## 2020-05-26 DIAGNOSIS — I447 Left bundle-branch block, unspecified: Secondary | ICD-10-CM | POA: Diagnosis not present

## 2020-05-26 DIAGNOSIS — Z8249 Family history of ischemic heart disease and other diseases of the circulatory system: Secondary | ICD-10-CM | POA: Diagnosis not present

## 2020-05-26 HISTORY — PX: TRANSPHENOIDAL APPROACH EXPOSURE: SHX6311

## 2020-05-26 HISTORY — PX: CRANIOTOMY: SHX93

## 2020-05-26 LAB — ABO/RH: ABO/RH(D): O POS

## 2020-05-26 LAB — GLUCOSE, CAPILLARY
Glucose-Capillary: 220 mg/dL — ABNORMAL HIGH (ref 70–99)
Glucose-Capillary: 242 mg/dL — ABNORMAL HIGH (ref 70–99)
Glucose-Capillary: 250 mg/dL — ABNORMAL HIGH (ref 70–99)
Glucose-Capillary: 266 mg/dL — ABNORMAL HIGH (ref 70–99)
Glucose-Capillary: 271 mg/dL — ABNORMAL HIGH (ref 70–99)

## 2020-05-26 LAB — TYPE AND SCREEN
ABO/RH(D): O POS
Antibody Screen: NEGATIVE

## 2020-05-26 SURGERY — CRANIOTOMY HYPOPHYSECTOMY TRANSNASAL APPROACH
Anesthesia: General | Site: Head

## 2020-05-26 MED ORDER — HEMOSTATIC AGENTS (NO CHARGE) OPTIME
TOPICAL | Status: DC | PRN
  Administered 2020-05-26: 1 via TOPICAL

## 2020-05-26 MED ORDER — MORPHINE SULFATE (PF) 2 MG/ML IV SOLN: 1.0000 mg | INTRAVENOUS | Status: DC | PRN

## 2020-05-26 MED ORDER — SODIUM CHLORIDE 0.9 % IV SOLN: INTRAVENOUS | Status: DC | PRN

## 2020-05-26 MED ORDER — LIDOCAINE-EPINEPHRINE 1 %-1:100000 IJ SOLN
INTRAMUSCULAR | Status: DC | PRN
  Administered 2020-05-26: 10 mL

## 2020-05-26 MED ORDER — ONDANSETRON HCL 4 MG/2ML IJ SOLN
INTRAMUSCULAR | Status: AC
  Filled 2020-05-26: qty 2

## 2020-05-26 MED ORDER — PHENYLEPHRINE 40 MCG/ML (10ML) SYRINGE FOR IV PUSH (FOR BLOOD PRESSURE SUPPORT)
PREFILLED_SYRINGE | INTRAVENOUS | Status: AC
  Filled 2020-05-26: qty 10

## 2020-05-26 MED ORDER — 0.9 % SODIUM CHLORIDE (POUR BTL) OPTIME
TOPICAL | Status: DC | PRN
  Administered 2020-05-26: 11:00:00 1000 mL

## 2020-05-26 MED ORDER — OXYMETAZOLINE HCL 0.05 % NA SOLN
NASAL | Status: DC | PRN
  Administered 2020-05-26: 1 via TOPICAL

## 2020-05-26 MED ORDER — SUGAMMADEX SODIUM 200 MG/2ML IV SOLN
INTRAVENOUS | Status: DC | PRN
  Administered 2020-05-26: 300 mg via INTRAVENOUS

## 2020-05-26 MED ORDER — ONDANSETRON HCL 4 MG/2ML IJ SOLN
INTRAMUSCULAR | Status: DC | PRN
  Administered 2020-05-26: 4 mg via INTRAVENOUS

## 2020-05-26 MED ORDER — LABETALOL HCL 5 MG/ML IV SOLN
10.0000 mg | INTRAVENOUS | Status: DC | PRN
  Administered 2020-05-26: 18:00:00 10 mg via INTRAVENOUS
  Filled 2020-05-26: qty 4

## 2020-05-26 MED ORDER — ROCURONIUM BROMIDE 10 MG/ML (PF) SYRINGE
PREFILLED_SYRINGE | INTRAVENOUS | Status: AC
  Filled 2020-05-26: qty 10

## 2020-05-26 MED ORDER — DAPAGLIFLOZIN PROPANEDIOL 10 MG PO TABS
10.0000 mg | ORAL_TABLET | Freq: Every day | ORAL | Status: DC
  Administered 2020-05-27 – 2020-05-28 (×2): 10 mg via ORAL
  Filled 2020-05-26 (×2): qty 1

## 2020-05-26 MED ORDER — CEFAZOLIN SODIUM-DEXTROSE 1-4 GM/50ML-% IV SOLN
1.0000 g | Freq: Three times a day (TID) | INTRAVENOUS | Status: DC
  Administered 2020-05-26 – 2020-05-28 (×6): 1 g via INTRAVENOUS
  Filled 2020-05-26 (×6): qty 50

## 2020-05-26 MED ORDER — TRIAMCINOLONE ACETONIDE 40 MG/ML IJ SUSP
INTRAMUSCULAR | Status: AC
  Filled 2020-05-26: qty 5

## 2020-05-26 MED ORDER — NICARDIPINE HCL IN NACL 20-0.86 MG/200ML-% IV SOLN
3.0000 mg/h | INTRAVENOUS | Status: DC
  Administered 2020-05-26: 20:00:00 5 mg/h via INTRAVENOUS
  Filled 2020-05-26: qty 200

## 2020-05-26 MED ORDER — CEFAZOLIN SODIUM-DEXTROSE 2-4 GM/100ML-% IV SOLN
INTRAVENOUS | Status: AC
  Filled 2020-05-26: qty 100

## 2020-05-26 MED ORDER — SPIRONOLACTONE 25 MG PO TABS
25.0000 mg | ORAL_TABLET | Freq: Every day | ORAL | Status: DC
  Administered 2020-05-27 – 2020-05-28 (×2): 25 mg via ORAL
  Filled 2020-05-26 (×2): qty 1

## 2020-05-26 MED ORDER — POTASSIUM CHLORIDE IN NACL 20-0.9 MEQ/L-% IV SOLN
INTRAVENOUS | Status: DC
  Filled 2020-05-26 (×2): qty 1000

## 2020-05-26 MED ORDER — ROCURONIUM BROMIDE 10 MG/ML (PF) SYRINGE
PREFILLED_SYRINGE | INTRAVENOUS | Status: DC | PRN
  Administered 2020-05-26: 30 mg via INTRAVENOUS
  Administered 2020-05-26: 70 mg via INTRAVENOUS
  Administered 2020-05-26: 50 mg via INTRAVENOUS
  Administered 2020-05-26: 30 mg via INTRAVENOUS
  Administered 2020-05-26: 20 mg via INTRAVENOUS

## 2020-05-26 MED ORDER — GLIPIZIDE 10 MG PO TABS
10.0000 mg | ORAL_TABLET | Freq: Two times a day (BID) | ORAL | Status: DC
  Administered 2020-05-27 – 2020-05-28 (×3): 10 mg via ORAL
  Filled 2020-05-26 (×4): qty 1

## 2020-05-26 MED ORDER — OXYMETAZOLINE HCL 0.05 % NA SOLN
NASAL | Status: AC
  Filled 2020-05-26: qty 30

## 2020-05-26 MED ORDER — HYDROCODONE-ACETAMINOPHEN 5-325 MG PO TABS: 1.0000 | ORAL_TABLET | ORAL | Status: DC | PRN

## 2020-05-26 MED ORDER — HYDROCORTISONE NA SUCCINATE PF 100 MG IJ SOLR
50.0000 mg | Freq: Three times a day (TID) | INTRAMUSCULAR | Status: DC
  Administered 2020-05-26 – 2020-05-27 (×3): 50 mg via INTRAVENOUS
  Filled 2020-05-26 (×3): qty 2

## 2020-05-26 MED ORDER — LIDOCAINE 2% (20 MG/ML) 5 ML SYRINGE
INTRAMUSCULAR | Status: DC | PRN
  Administered 2020-05-26: 100 mg via INTRAVENOUS

## 2020-05-26 MED ORDER — SACUBITRIL-VALSARTAN 24-26 MG PO TABS
1.0000 | ORAL_TABLET | Freq: Two times a day (BID) | ORAL | Status: DC
  Administered 2020-05-26 – 2020-05-28 (×4): 1 via ORAL
  Filled 2020-05-26 (×4): qty 1

## 2020-05-26 MED ORDER — BISACODYL 10 MG RE SUPP: 10.0000 mg | Freq: Every day | RECTAL | Status: DC | PRN

## 2020-05-26 MED ORDER — ACETAMINOPHEN 650 MG RE SUPP: 650.0000 mg | RECTAL | Status: DC | PRN

## 2020-05-26 MED ORDER — DOCUSATE SODIUM 100 MG PO CAPS
100.0000 mg | ORAL_CAPSULE | Freq: Two times a day (BID) | ORAL | Status: DC
  Administered 2020-05-26 – 2020-05-28 (×4): 100 mg via ORAL
  Filled 2020-05-26 (×4): qty 1

## 2020-05-26 MED ORDER — PROMETHAZINE HCL 25 MG PO TABS: 12.5000 mg | ORAL_TABLET | ORAL | Status: DC | PRN

## 2020-05-26 MED ORDER — PROPOFOL 10 MG/ML IV BOLUS
INTRAVENOUS | Status: DC | PRN
  Administered 2020-05-26: 100 mg via INTRAVENOUS
  Administered 2020-05-26: 50 mg via INTRAVENOUS

## 2020-05-26 MED ORDER — FENTANYL CITRATE (PF) 100 MCG/2ML IJ SOLN: 25.0000 ug | INTRAMUSCULAR | Status: DC | PRN

## 2020-05-26 MED ORDER — EPHEDRINE 5 MG/ML INJ
INTRAVENOUS | Status: AC
  Filled 2020-05-26: qty 10

## 2020-05-26 MED ORDER — LORATADINE 10 MG PO TABS: 10.0000 mg | ORAL_TABLET | Freq: Every day | ORAL | Status: DC | PRN

## 2020-05-26 MED ORDER — LIDOCAINE-EPINEPHRINE 1 %-1:100000 IJ SOLN
INTRAMUSCULAR | Status: AC
  Filled 2020-05-26: qty 1

## 2020-05-26 MED ORDER — OXYMETAZOLINE HCL 0.05 % NA SOLN
NASAL | Status: DC | PRN
Start: 2020-05-26 — End: 2020-05-26
  Administered 2020-05-26 (×2): 2 via NASAL

## 2020-05-26 MED ORDER — ONDANSETRON HCL 4 MG PO TABS: 4.0000 mg | ORAL_TABLET | ORAL | Status: DC | PRN

## 2020-05-26 MED ORDER — LACTATED RINGERS IV SOLN: INTRAVENOUS | Status: DC

## 2020-05-26 MED ORDER — FLEET ENEMA 7-19 GM/118ML RE ENEM: 1.0000 | ENEMA | Freq: Once | RECTAL | Status: DC | PRN

## 2020-05-26 MED ORDER — INSULIN ASPART 100 UNIT/ML ~~LOC~~ SOLN
0.0000 [IU] | SUBCUTANEOUS | Status: DC
  Administered 2020-05-26: 19:00:00 11 [IU] via SUBCUTANEOUS
  Administered 2020-05-26 – 2020-05-27 (×2): 7 [IU] via SUBCUTANEOUS
  Administered 2020-05-27: 20:00:00 4 [IU] via SUBCUTANEOUS
  Administered 2020-05-27 (×2): 7 [IU] via SUBCUTANEOUS
  Administered 2020-05-27 – 2020-05-28 (×4): 4 [IU] via SUBCUTANEOUS

## 2020-05-26 MED ORDER — METFORMIN HCL 500 MG PO TABS
1000.0000 mg | ORAL_TABLET | Freq: Two times a day (BID) | ORAL | Status: DC
  Administered 2020-05-26 – 2020-05-28 (×4): 1000 mg via ORAL
  Filled 2020-05-26 (×4): qty 2

## 2020-05-26 MED ORDER — ENOXAPARIN SODIUM 40 MG/0.4ML ~~LOC~~ SOLN
40.0000 mg | SUBCUTANEOUS | Status: DC
  Administered 2020-05-27 – 2020-05-28 (×2): 40 mg via SUBCUTANEOUS
  Filled 2020-05-26 (×2): qty 0.4

## 2020-05-26 MED ORDER — SENNA 8.6 MG PO TABS
1.0000 | ORAL_TABLET | Freq: Two times a day (BID) | ORAL | Status: DC
  Administered 2020-05-26 – 2020-05-28 (×4): 8.6 mg via ORAL
  Filled 2020-05-26 (×4): qty 1

## 2020-05-26 MED ORDER — MUPIROCIN CALCIUM 2 % EX CREA
TOPICAL_CREAM | CUTANEOUS | Status: AC
  Filled 2020-05-26: qty 15

## 2020-05-26 MED ORDER — LIDOCAINE 2% (20 MG/ML) 5 ML SYRINGE
INTRAMUSCULAR | Status: AC
  Filled 2020-05-26: qty 5

## 2020-05-26 MED ORDER — MIDAZOLAM HCL 2 MG/2ML IJ SOLN
INTRAMUSCULAR | Status: DC | PRN
  Administered 2020-05-26: 2 mg via INTRAVENOUS

## 2020-05-26 MED ORDER — ORAL CARE MOUTH RINSE: 15.0000 mL | Freq: Once | OROMUCOSAL | Status: AC

## 2020-05-26 MED ORDER — CHLORHEXIDINE GLUCONATE 0.12 % MT SOLN
15.0000 mL | Freq: Once | OROMUCOSAL | Status: AC
  Administered 2020-05-26: 07:00:00 15 mL via OROMUCOSAL
  Filled 2020-05-26: qty 15

## 2020-05-26 MED ORDER — CHLORHEXIDINE GLUCONATE CLOTH 2 % EX PADS
6.0000 | MEDICATED_PAD | Freq: Every day | CUTANEOUS | Status: DC
  Administered 2020-05-27: 16:00:00 6 via TOPICAL

## 2020-05-26 MED ORDER — EPHEDRINE SULFATE-NACL 50-0.9 MG/10ML-% IV SOSY
PREFILLED_SYRINGE | INTRAVENOUS | Status: DC | PRN
  Administered 2020-05-26: 10 mg via INTRAVENOUS
  Administered 2020-05-26: 5 mg via INTRAVENOUS

## 2020-05-26 MED ORDER — ALBUMIN HUMAN 5 % IV SOLN: INTRAVENOUS | Status: DC | PRN

## 2020-05-26 MED ORDER — ARTIFICIAL TEARS OPHTHALMIC OINT
TOPICAL_OINTMENT | OPHTHALMIC | Status: DC | PRN
  Administered 2020-05-26: 1 via OPHTHALMIC

## 2020-05-26 MED ORDER — FENTANYL CITRATE (PF) 250 MCG/5ML IJ SOLN
INTRAMUSCULAR | Status: DC | PRN
  Administered 2020-05-26: 100 ug via INTRAVENOUS
  Administered 2020-05-26: 50 ug via INTRAVENOUS

## 2020-05-26 MED ORDER — AMIODARONE HCL 200 MG PO TABS
200.0000 mg | ORAL_TABLET | Freq: Every day | ORAL | Status: DC
  Administered 2020-05-27 – 2020-05-28 (×2): 200 mg via ORAL
  Filled 2020-05-26 (×2): qty 1

## 2020-05-26 MED ORDER — FENTANYL CITRATE (PF) 250 MCG/5ML IJ SOLN
INTRAMUSCULAR | Status: AC
  Filled 2020-05-26: qty 5

## 2020-05-26 MED ORDER — POLYETHYLENE GLYCOL 3350 17 G PO PACK: 17.0000 g | PACK | Freq: Every day | ORAL | Status: DC | PRN

## 2020-05-26 MED ORDER — ACETAMINOPHEN 500 MG PO TABS
1000.0000 mg | ORAL_TABLET | Freq: Once | ORAL | Status: AC
  Administered 2020-05-26: 07:00:00 1000 mg via ORAL
  Filled 2020-05-26: qty 2

## 2020-05-26 MED ORDER — PHENYLEPHRINE 40 MCG/ML (10ML) SYRINGE FOR IV PUSH (FOR BLOOD PRESSURE SUPPORT)
PREFILLED_SYRINGE | INTRAVENOUS | Status: DC | PRN
  Administered 2020-05-26 (×3): 80 ug via INTRAVENOUS

## 2020-05-26 MED ORDER — MIDAZOLAM HCL 2 MG/2ML IJ SOLN
INTRAMUSCULAR | Status: AC
  Filled 2020-05-26: qty 2

## 2020-05-26 MED ORDER — PROPOFOL 10 MG/ML IV BOLUS
INTRAVENOUS | Status: AC
  Filled 2020-05-26: qty 20

## 2020-05-26 MED ORDER — PANTOPRAZOLE SODIUM 40 MG PO TBEC
40.0000 mg | DELAYED_RELEASE_TABLET | Freq: Every day | ORAL | Status: DC
  Administered 2020-05-27 – 2020-05-28 (×2): 40 mg via ORAL
  Filled 2020-05-26 (×2): qty 1

## 2020-05-26 MED ORDER — ENALAPRILAT 1.25 MG/ML IV SOLN: 1.2500 mg | Freq: Four times a day (QID) | INTRAVENOUS | Status: DC | PRN

## 2020-05-26 MED ORDER — CEFAZOLIN SODIUM-DEXTROSE 2-3 GM-%(50ML) IV SOLR
INTRAVENOUS | Status: DC | PRN
  Administered 2020-05-26: 2 g via INTRAVENOUS

## 2020-05-26 MED ORDER — ONDANSETRON HCL 4 MG/2ML IJ SOLN
4.0000 mg | INTRAMUSCULAR | Status: DC | PRN
  Administered 2020-05-27: 05:00:00 4 mg via INTRAVENOUS
  Filled 2020-05-26: qty 2

## 2020-05-26 MED ORDER — PROMETHAZINE HCL 25 MG/ML IJ SOLN: 6.2500 mg | INTRAMUSCULAR | Status: DC | PRN

## 2020-05-26 MED ORDER — HYDROCORTISONE NA SUCCINATE PF 100 MG IJ SOLR
INTRAMUSCULAR | Status: DC | PRN
  Administered 2020-05-26: 100 mg via INTRAVENOUS

## 2020-05-26 MED ORDER — PHENYLEPHRINE HCL-NACL 10-0.9 MG/250ML-% IV SOLN
INTRAVENOUS | Status: DC | PRN
  Administered 2020-05-26: 20 ug/min via INTRAVENOUS

## 2020-05-26 MED ORDER — ACETAMINOPHEN 325 MG PO TABS
650.0000 mg | ORAL_TABLET | ORAL | Status: DC | PRN
  Administered 2020-05-27 – 2020-05-28 (×2): 650 mg via ORAL
  Filled 2020-05-26 (×2): qty 2

## 2020-05-26 SURGICAL SUPPLY — 49 items
BLADE RAD40 ROTATE 4M 4 5PK (BLADE) IMPLANT
BLADE RAD40 ROTATE 4M 4MM 5PK (BLADE)
BLADE ROTATE TRICUT 4MX13CM M4 (BLADE) ×1
BLADE ROTATE TRICUT 4X13 M4 (BLADE) ×3 IMPLANT
BLADE SURG 15 STRL LF DISP TIS (BLADE) ×2 IMPLANT
BLADE SURG 15 STRL SS (BLADE) ×2
BUR DIAMOND 13CMX5MM 70DEG (BURR)
BUR DIAMOND 13X5 70D (BURR) IMPLANT
BUR DIAMOND 15CMX5MM 15SINUS (BURR)
BUR DIAMOND CURV 15X5 15D (BURR) IMPLANT
BUR TAPER CHOANAL ATRESIA 30K (BURR) ×4 IMPLANT
CANISTER SUCT 3000ML PPV (MISCELLANEOUS) ×8 IMPLANT
COAGULATOR SUCT 8FR VV (MISCELLANEOUS) ×4 IMPLANT
DRAPE HALF SHEET 40X57 (DRAPES) ×4 IMPLANT
DRESSING NASAL POPE 10X1.5X2.5 (GAUZE/BANDAGES/DRESSINGS) IMPLANT
DRSG NASAL POPE 10X1.5X2.5 (GAUZE/BANDAGES/DRESSINGS)
DRSG NASOPORE 8CM (GAUZE/BANDAGES/DRESSINGS) ×4 IMPLANT
ELECT COATED BLADE 2.86 ST (ELECTRODE) IMPLANT
ELECT REM PT RETURN 9FT ADLT (ELECTROSURGICAL) ×4
ELECTRODE REM PT RTRN 9FT ADLT (ELECTROSURGICAL) ×2 IMPLANT
GLOVE BIOGEL M 7.0 STRL (GLOVE) ×8 IMPLANT
GOWN STRL REUS W/ TWL LRG LVL3 (GOWN DISPOSABLE) ×4 IMPLANT
GOWN STRL REUS W/TWL LRG LVL3 (GOWN DISPOSABLE) ×4
GRAFT DURAGEN MATRIX 1WX1L (Tissue) ×4 IMPLANT
KIT BASIN OR (CUSTOM PROCEDURE TRAY) ×4 IMPLANT
KIT TURNOVER KIT B (KITS) ×4 IMPLANT
NEEDLE HYPO 25GX1X1/2 BEV (NEEDLE) ×4 IMPLANT
NEEDLE SPNL 25GX3.5 QUINCKE BL (NEEDLE) ×4 IMPLANT
NS IRRIG 1000ML POUR BTL (IV SOLUTION) ×4 IMPLANT
SEALANT ADHERUS EXTEND TIP (MISCELLANEOUS) ×4 IMPLANT
SHEATH ENDOSCRUB 0 DEG (SHEATH) ×4 IMPLANT
SHEATH ENDOSCRUB 30 DEG (SHEATH) IMPLANT
SHEATH ENDOSCRUB 45 DEG (SHEATH) IMPLANT
SPLINT NASAL DOYLE BI-VL (GAUZE/BANDAGES/DRESSINGS) IMPLANT
SPONGE NEURO XRAY DETECT 1X3 (DISPOSABLE) ×4 IMPLANT
SUT 5.0 PDS RB-1 (SUTURE)
SUT ETHILON 6 0 P 1 (SUTURE) IMPLANT
SUT PDS AB 4-0 RB1 27 (SUTURE) IMPLANT
SUT PDS PLUS AB 5-0 RB-1 (SUTURE) IMPLANT
SUT PLAIN 4 0 ~~LOC~~ 1 (SUTURE) IMPLANT
TOWEL GREEN STERILE FF (TOWEL DISPOSABLE) ×4 IMPLANT
TRACKER ENT INSTRUMENT (MISCELLANEOUS) ×4 IMPLANT
TRACKER ENT PATIENT (MISCELLANEOUS) ×4 IMPLANT
TRAP SPECIMEN MUCUS 40CC (MISCELLANEOUS) IMPLANT
TRAY ENT MC OR (CUSTOM PROCEDURE TRAY) ×4 IMPLANT
TUBE CONNECTING 12'X1/4 (SUCTIONS) ×1
TUBE CONNECTING 12X1/4 (SUCTIONS) ×3 IMPLANT
TUBING EXTENTION W/L.L. (IV SETS) ×4 IMPLANT
TUBING STRAIGHTSHOT EPS 5PK (TUBING) ×4 IMPLANT

## 2020-05-26 NOTE — Anesthesia Procedure Notes (Signed)
Procedure Name: Intubation Date/Time: 05/26/2020 9:06 AM Performed by: Dorthea Cove, CRNA Pre-anesthesia Checklist: Patient identified, Emergency Drugs available, Suction available and Patient being monitored Patient Re-evaluated:Patient Re-evaluated prior to induction Oxygen Delivery Method: Circle system utilized Preoxygenation: Pre-oxygenation with 100% oxygen Induction Type: IV induction Ventilation: Mask ventilation without difficulty Laryngoscope Size: Mac and 4 Grade View: Grade I Tube type: Oral Tube size: 8.0 mm Number of attempts: 1 Airway Equipment and Method: Stylet and Oral airway Placement Confirmation: ETT inserted through vocal cords under direct vision,  positive ETCO2 and breath sounds checked- equal and bilateral Secured at: 24 cm Tube secured with: Tape Dental Injury: Teeth and Oropharynx as per pre-operative assessment

## 2020-05-26 NOTE — Op Note (Signed)
Operative Note:  ENDOSCOPIC TRANSSPHENOIDAL PITUITARY RESECTION WITH NAVIGATION      Patient: Kevin Hampton  Medical record number: [id number]  Date:05/26/2020  Pre-operative Indications: 1.  Pituitary Mass       Postoperative Indications: Same  Surgical Procedure: 1. Endoscopic transsphenoidal pituitary resection with intraoperative navigation    2.  Bilateral sphenoidotomy with removal of tissue      Anesthesia: GET  Surgeon: Delsa Bern, M.D.  Neurosurgeon: Dr. Marcello Moores  Complications: None  EBL: 150 cc  Findings: Deviated nasal septum, no evidence of active sinonasal disease.  Moderate mucosal thickening in the sphenoid sinus. Naso-pore sphenoid packing placed.  Note: The neurosurgical component of the operative procedure is dictated as a separate operative note.   Brief History: The patient is a 54 y.o. male with a history of pituitary mass. The patient has a history of progressive acromegaly.  Patient with secreting pituitary macroadenoma. The patient was referred to Dr. Marcello Moores for neurosurgical evaluation.  Patient seen by me at [facility] preoperatively with review of nasal anatomy and sinus CT scan for navigation.  Given the patient's history and findings, the above surgical procedures were recommended, risks and benefits were discussed in detail with the patient.  They understand and agree with our plan for surgery which is scheduled at [facility] under general anesthesia.  Surgical Procedure: The patient is brought to the neurosurgical operating room on 05/26/2020 and placed in supine position on the operating table. General endotracheal anesthesia was established without difficulty. When the patient was adequately anesthetized, surgical timeout was performed with correct identification of the patient and the surgical procedure. The patient's nose was then injected with 8 cc of 1% lidocaine 1:100,000 dilution epinephrine which was injected in a  submucosal fashion. The patient's nose was then packed with Afrin-soaked cottonoid pledgets were left in place for approximately 10 minutes to allow for vasoconstriction and hemostasis.  The Xomed Fusion navigation headgear was applied and anatomic and surgical landmarks were identified and confirmed, navigation was used throughout the sinus component of the surgical procedure.  With the patient prepped draped and prepared for surgery, nasal endoscopy was performed on the patient's right.  The middle turbinate was carefully lateralized to allow access to the posterior aspect of the nasal passageway.  The right sphenoid sinus ostium was identified.  The inferior aspect of the superior turbinate was then resected with through-cutting forceps and a microdebrider.  The right sphenoid sinus ostium was enlarged in a superior and lateral direction using the microdebrider and through-cutting forceps to create a widely patent ostium.  Nasal endoscopy on the patient's left-hand side was then undertaken.  The left middle turbinate was lateralized and the posterior nasal cavity was visualized with identification of the left sphenoid sinus ostium using navigation.  The inferior aspect of the superior turbinate was resected and the sinus ostium was enlarged in the lateral and superior direction to create a wide sphenoid sinus ostium.  A posterior septectomy was then performed with a Surveyor, quantity.  Bone, cartilage and soft tissue was then resected to create a wide posterior septotomy.  The anterior face of the sphenoid sinus and sphenoid sinus septum were then resected with a combination of through-cutting forceps, osteotome and microdebrider to allow direct access to the entire posterior aspect of the sphenoid sinus and pituitary fossa.  Sphenoid sinus mucosa overlying the pituitary fossa was elevated and lateralized.  The anterior face of the pituitary fossa was demarcated using navigation.  With adequate access  to the  pituitary fossa the neurosurgical component of the procedure was begun by Dr. Marcello Moores.  This is dictated as a separate operative report.  Resection of the pituitary tumor was undertaken using direct visualization of the 0 degree endoscope, navigation and blunt and sharp dissection.  With pituitary tumor resection completed, reconstruction was undertaken.  Placement of DuraGen dural graft and Adheris dural glue.  Surgicel was placed over the pituitary fossa defect and the sphenoid sinus was loosely packed with Naso-pore absorbable nasal packing.  There was no active bleeding and no evidence of spinal fluid leak.  The sphenoid sinus was carefully inspected, no further bleeding along the mucosal margins, sphenoidotomy sites or posterior septectomy.  The patient's nasal cavity was irrigated and suctioned.  Surgical sponge count was correct. An oral gastric tube was passed and the stomach contents were aspirated. Patient was awakened from anesthetic and transferred from the operating room to the recovery room in stable condition. There were no complications and blood loss was 150 cc.   Delsa Bern, M.D. [facility] 05/26/2020

## 2020-05-26 NOTE — Op Note (Signed)
Procedure(s): ENDOSCOPIC TRANSPHENOIDAL RESECTION OF TUMOR TRANSPHENOIDAL APPROACH EXPOSURE Procedure Note  Kevin Hampton male 54 y.o. 05/26/2020  Procedure(s) and Anesthesia Type: Panel 1:    * ENDOSCOPIC TRANSPHENOIDAL RESECTION OF TUMOR - General  Use of computer assisted neuro-navigation  Panel 2:    * TRANSPHENOIDAL APPROACH EXPOSURE - General  Surgeon(s) and Role: Panel 1:    Vallarie Mare, MD - Primary Panel 2:    Jerrell Belfast, MD - Primary   Indications: This is a 54 year old man with acromegaly and associated CHF and diabetes.  He was found to have a large pituitary macroadenoma with suprasellar extension and impingement of the optic apparatus.  For purposes of medical control of his acromegaly as well as for preservation of vision, I recommended transsphenoidal surgery.  Risks, benefits, alternatives, expected convalescence were discussed with the patient.  Risks discussed included, but were not limited to, bleeding, pain, infection, seizure, stroke, neurologic deficit, visual deficit, pituitary insufficiency, recurrence of tumor, spinal fluid leak, coma, and death.     Surgeon: Vallarie Mare   Co-surgeon: Jerrell Belfast, MD  Anesthesia: General   Procedure Detail  ENDOSCOPIC TRANSPHENOIDAL RESECTION OF TUMOR   Patient was brought to the operating room.  General anesthesia was induced and patient was intubated by anesthesia service.  After appropriate lines and monitors placed, patient was positioned supine on the table.  Preoperative thin cut CT was reconstructed into 3D image.  This allowed for surface registration to allow for intraoperative navigation.  The abdomen and the nasal area was prepped and draped in sterile fashion.  A timeout performed.  Preoperative antibiotics and hydrocortisone was administered.  Dr. Wilburn Cornelia then performed the endoscopic approach with septostomy and sphenoidotomy.  I joined in the case and removed the thinned  face of the enlarged sella.  Neuro navigation was used to guide the opening of the sella so that adequate width was established.  A small shelf of sellar floor was left in place to help buttress the reconstruction.  The dura of the sella was then opened in cruciate manner from behind the anterior intercavernous sinus.  The dural flaps were bipolared.  Using various angled ringed curettes and suction as well as pituitary rongeurs, the tumor was removed first in its posterior portion and then its lateral portions were dissected from the medial walls of the cavernous sinus.  The tumor was admixed of liquidy and more firm tissue.  The tumor was then freed and dissected from the diaphragma and the anterior superior portion of the tumor was removed with angled ringed curettes.  Some of the tumor appeared to have grown through the diaphragma and remove this, small opening of the anterior diaphragmatic arachnoid was incidentally noted and with manipulation of the diaphragma, a tiny amount of CSF was noted.  However, without manipulating the diaphragma, there was no leakage of CSF.  This suprasellar component was removed and 30 degree endoscope was used to confirm no residual tumor.  The normal pituitary gland which was then was noted along the superior aspect of the resection as expected based on his preoperative MRI.  Meticulous hemostasis was obtained.  On close expansion, there was no evidence of CSF leak.  A small piece of DuraGen was placed as an inlay within the dural folds.  This was then secured with adherence dural spray.  The closure was then turned over to Dr. Wilburn Cornelia.  No complications were noted.  Findings: Large pituitary macroadenoma  Estimated Blood Loss:  200  mL         Drains: none         Blood Given: none          Specimens: pituitary macroadenoma              Complications:  * No complications entered in OR log *         Disposition: PACU - hemodynamically stable.         Condition:  stable

## 2020-05-26 NOTE — Anesthesia Procedure Notes (Signed)
Arterial Line Insertion Start/End12/15/2021 7:55 AM, 05/26/2020 8:00 AM Performed by: Dorthea Cove, CRNA, CRNA  Patient location: Pre-op. Preanesthetic checklist: patient identified, IV checked, site marked, risks and benefits discussed, surgical consent, monitors and equipment checked, pre-op evaluation, timeout performed and anesthesia consent Lidocaine 1% used for infiltration Left, radial was placed Catheter size: 20 Fr Hand hygiene performed  and maximum sterile barriers used   Attempts: 1 Procedure performed without using ultrasound guided technique. Following insertion, dressing applied and Biopatch. Post procedure assessment: normal and unchanged  Patient tolerated the procedure well with no immediate complications.

## 2020-05-26 NOTE — Transfer of Care (Signed)
Immediate Anesthesia Transfer of Care Note  Patient: Kevin Hampton  Procedure(s) Performed: ENDOSCOPIC TRANSPHENOIDAL RESECTION OF TUMOR (N/A Head) TRANSPHENOIDAL APPROACH EXPOSURE (N/A )  Patient Location: PACU  Anesthesia Type:General  Level of Consciousness: awake, alert  and oriented  Airway & Oxygen Therapy: Patient Spontanous Breathing and Patient connected to face mask oxygen  Post-op Assessment: Report given to RN and Post -op Vital signs reviewed and stable  Post vital signs: Reviewed and stable  Last Vitals:  Vitals Value Taken Time  BP 149/89 05/26/20 1240  Temp    Pulse 98 05/26/20 1243  Resp 14 05/26/20 1241  SpO2 96 % 05/26/20 1243  Vitals shown include unvalidated device data.  Last Pain:  Vitals:   05/26/20 0717  TempSrc: Oral  PainSc:          Complications: No complications documented.

## 2020-05-26 NOTE — Anesthesia Postprocedure Evaluation (Signed)
Anesthesia Post Note  Patient: Kevin Hampton  Procedure(s) Performed: ENDOSCOPIC TRANSPHENOIDAL RESECTION OF TUMOR (N/A Head) TRANSPHENOIDAL APPROACH EXPOSURE (N/A )     Patient location during evaluation: PACU Anesthesia Type: General Level of consciousness: awake and alert, awake and oriented Pain management: pain level controlled Vital Signs Assessment: post-procedure vital signs reviewed and stable Respiratory status: spontaneous breathing, nonlabored ventilation and respiratory function stable Cardiovascular status: stable and blood pressure returned to baseline Postop Assessment: no apparent nausea or vomiting Anesthetic complications: no   No complications documented.  Last Vitals:  Vitals:   05/26/20 1309 05/26/20 1500  BP: (!) 149/91   Pulse: 96   Resp: 15   Temp: 36.6 C 36.8 C  SpO2: 93%     Last Pain:  Vitals:   05/26/20 1500  TempSrc: Oral  PainSc:                  Catalina Gravel

## 2020-05-26 NOTE — H&P (Signed)
CC: acromegaly  HPI:     Patient is a 54 y.o. male presents with acromegaly and associated CHF, DM and was found to have a large pituitary mass.  Surgical resection was recommended.  He denies vision changes.    Patient Active Problem List   Diagnosis Date Noted  . Acromegaly ([place]) 03/01/2020  . Pituitary macroadenoma ([place]) 01/09/2020  . Left bundle branch block 09/10/2019  . Frequent PVCs 09/10/2019  . Overweight (BMI 25.0-29.9) 09/10/2019  . Chronic systolic heart failure ([place]) 12/15/2018  . HTN (hypertension) 06/10/2012  . DM2 (diabetes mellitus, type 2) ([place]) 10/18/2011   Past Medical History:  Diagnosis Date  . Acromegaly ([place])   . CHF (congestive heart failure) ([place])   . Essential hypertension   . Hypertensive emergency 12/15/2018  . Nonischemic cardiomyopathy ([place])   . Obesity   . Pituitary macroadenoma ([place])   . Systolic heart failure ([place])   . Type 2 diabetes mellitus with complication [facility])     Past Surgical History:  Procedure Laterality Date  . RIGHT/LEFT HEART CATH AND CORONARY ANGIOGRAPHY N/A 12/16/2018   Procedure: RIGHT/LEFT HEART CATH AND CORONARY ANGIOGRAPHY;  Surgeon: Lorretta Harp, MD;  Location: [place] [facility];  Service: Cardiovascular;  Laterality: N/A;    Medications Prior to Admission  Medication Sig Dispense Refill Last Dose  . amiodarone (PACERONE) 200 MG tablet Take 1 tablet (200 mg total) by mouth daily. 60 tablet 3 05/26/2020 at 0445  . dapagliflozin propanediol (FARXIGA) 10 MG TABS tablet Take 1 tablet (10 mg total) by mouth daily before breakfast. 90 tablet 0 05/25/2020 at Unknown time  . ENTRESTO 24-26 MG Take 1 tablet by mouth twice daily 60 tablet 11 05/26/2020 at 0445  . glipiZIDE (GLUCOTROL) 10 MG tablet Take 1 tablet (10 mg total) by mouth 2 (two) times daily before a meal. 180 tablet 1 05/25/2020 at Unknown time  . loratadine (CLARITIN) 10 MG tablet Take 10 mg by mouth daily as needed (allergies).    Past Week at Unknown time  .  metFORMIN (GLUCOPHAGE) 1000 MG tablet Take 1 tablet (1,000 mg total) by mouth 2 (two) times daily with a meal. 180 tablet 1 05/25/2020 at Unknown time  . spironolactone (ALDACTONE) 25 MG tablet Take 1 tablet by mouth once daily 90 tablet 0 05/25/2020 at Unknown time   Allergies  Allergen Reactions  . Coreg [Carvedilol] Other (See Comments)    Brings heart rate and blood pressure down severly    Social History   Tobacco Use  . Smoking status: Former Research scientist (life sciences)  . Smokeless tobacco: Never Used  Substance Use Topics  . Alcohol use: Never    Family History  Problem Relation Age of Onset  . Hypertension Mother   . Heart attack Mother   . Hypertension Father   . Hypertension Sister   . Hypertension Brother      Review of Systems Pertinent items noted in HPI and remainder of comprehensive ROS otherwise negative.  Objective:   Patient Vitals for the past 8 hrs:  BP Temp Temp src Pulse Resp SpO2 Height Weight  05/26/20 0717 131/88 98.9 F (37.2 C) Oral 88 18 99 % 6\' 4"  (1.93 m) 105.7 kg   No intake/output data recorded. No intake/output data recorded.      General : Alert, cooperative, no distress, appears stated age   Head:  Coarse features    Eyes: PERRL, conjunctiva/corneas clear, EOM's intact. Fundi could not be visualized Neck: Supple Chest:  Respirations unlabored Chest  wall: no tenderness or deformity Heart: Regular rate and rhythm Abdomen: Soft, nontender and nondistended Extremities: enlarged fingers Skin: normal turgor, color and texture Neurologic:  Alert, oriented x 3.  Eyes open spontaneously. PERRL, EOMI, VFC, no facial droop. V1-3 intact.  No dysarthria, tongue protrusion symmetric.  CNII-XII intact. Normal strength, sensation and reflexes throughout.  No pronator drift, full strength in legs       Data ReviewRadiology review: Pituitary macroadenoma with suprasellar extension  Assessment:  Acromegaly with secretory pituitary tumor  Plan:   -  transsphenoidal resection today

## 2020-05-26 NOTE — Progress Notes (Signed)
   ENT Progress Note: Procedure(s): ENDOSCOPIC TRANSPHENOIDAL RESECTION OF TUMOR, POSS FAT/FACIA GRAFT PLACEMENT OF LUMBAR DRAIN TRANSPHENOIDAL APPROACH EXPOSURE   Subjective: Stable pre-op  Objective: Vital signs in last 24 hours: Temp:  [98.9 F (37.2 C)] 98.9 F (37.2 C) (12/15 0717) Pulse Rate:  [88] 88 (12/15 0717) Resp:  [18] 18 (12/15 0717) BP: (131)/(88) 131/88 (12/15 0717) SpO2:  [99 %] 99 % (12/15 0717) Weight:  [105.7 kg] 105.7 kg (12/15 0717) Weight change:     Intake/Output from previous day: No intake/output data recorded. Intake/Output this shift: No intake/output data recorded.  Labs: No results for input(s): WBC, HGB, HCT, PLT in the last 72 hours. No results for input(s): NA, K, CL, CO2, GLUCOSE, BUN, CALCIUM in the last 72 hours.  Invalid input(s): CREATININR  Studies/Results: No results found.   PHYSICAL EXAM: Deviated nasal septum   Assessment/Plan: Pt for Endoscopic Trans-Sphenoidal Pituitary resection with navigation    Jerrell Belfast 05/26/2020, 8:32 AM

## 2020-05-27 ENCOUNTER — Encounter (HOSPITAL_COMMUNITY): Payer: Self-pay | Admitting: Neurosurgery

## 2020-05-27 LAB — URINALYSIS, ROUTINE W REFLEX MICROSCOPIC
Bacteria, UA: NONE SEEN
Bilirubin Urine: NEGATIVE
Glucose, UA: >=500 mg/dL — AB
Ketones, ur: 80 mg/dL — AB
Nitrite: NEGATIVE
Protein, ur: NEGATIVE mg/dL
Specific Gravity, Urine: 1.025 (ref 1.005–1.030)
pH: 5 (ref 5.0–8.0)

## 2020-05-27 LAB — BASIC METABOLIC PANEL
Anion gap: 18 — ABNORMAL HIGH (ref 5–15)
BUN: 25 mg/dL — ABNORMAL HIGH (ref 6–20)
CO2: 16 mmol/L — ABNORMAL LOW (ref 22–32)
Calcium: 9 mg/dL (ref 8.9–10.3)
Chloride: 101 mmol/L (ref 98–111)
Creatinine, Ser: 1.46 mg/dL — ABNORMAL HIGH (ref 0.61–1.24)
GFR, Estimated: 56 mL/min — ABNORMAL LOW (ref >60)
Glucose, Bld: 231 mg/dL — ABNORMAL HIGH (ref 70–99)
Potassium: 4.5 mmol/L (ref 3.5–5.1)
Sodium: 135 mmol/L (ref 135–145)

## 2020-05-27 LAB — GLUCOSE, CAPILLARY
Glucose-Capillary: 225 mg/dL — ABNORMAL HIGH (ref 70–99)
Glucose-Capillary: 199 mg/dL — ABNORMAL HIGH (ref 70–99)
Glucose-Capillary: 244 mg/dL — ABNORMAL HIGH (ref 70–99)
Glucose-Capillary: 216 mg/dL — ABNORMAL HIGH (ref 70–99)
Glucose-Capillary: 185 mg/dL — ABNORMAL HIGH (ref 70–99)
Glucose-Capillary: 194 mg/dL — ABNORMAL HIGH (ref 70–99)

## 2020-05-27 LAB — CBC
HCT: 42.4 % (ref 39.0–52.0)
Hemoglobin: 14.5 g/dL (ref 13.0–17.0)
MCH: 31.9 pg (ref 26.0–34.0)
MCHC: 34.2 g/dL (ref 30.0–36.0)
MCV: 93.4 fL (ref 80.0–100.0)
Platelets: 231 10*3/uL (ref 150–400)
RBC: 4.54 MIL/uL (ref 4.22–5.81)
RDW: 14.3 % (ref 11.5–15.5)
WBC: 15 10*3/uL — ABNORMAL HIGH (ref 4.0–10.5)
nRBC: 0 % (ref 0.0–0.2)

## 2020-05-27 MED ORDER — HYDROCORTISONE NA SUCCINATE PF 100 MG IJ SOLR
25.0000 mg | Freq: Three times a day (TID) | INTRAMUSCULAR | Status: DC
  Administered 2020-05-27 – 2020-05-28 (×3): 25 mg via INTRAVENOUS
  Filled 2020-05-27 (×3): qty 2

## 2020-05-27 NOTE — Progress Notes (Signed)
Subjective: Patient reports no headaches, minimal nasal drainage.  Objective: Vital signs in last 24 hours: Temp:  [97.1 F (36.2 C)-99.1 F (37.3 C)] 98.7 F (37.1 C) (12/16 0800) Pulse Rate:  [84-100] 90 (12/16 0900) Resp:  [15-30] 25 (12/16 0900) BP: (116-153)/(63-95) 121/63 (12/16 0900) SpO2:  [88 %-100 %] 94 % (12/16 0900) Arterial Line BP: (96-164)/(56-81) 103/65 (12/16 0900)  Intake/Output from previous day: 12/15 0701 - 12/16 0700 In: 5441.4 [P.O.:1750; I.V.:3091.3; IV Piggyback:600.1] Out: 4275 [Urine:4175; Blood:100] Intake/Output this shift: No intake/output data recorded.  Awake, alert, Ox3.  VFC. No pronator drift.  No drainage from nose on sniff test.  Lab Results: Recent Labs    05/27/20 0500  WBC 15.0*  HGB 14.5  HCT 42.4  PLT 231   BMET Recent Labs    05/27/20 0500  NA 135  K 4.5  CL 101  CO2 16*  GLUCOSE 231*  BUN 25*  CREATININE 1.46*  CALCIUM 9.0    Studies/Results: No results found.  Assessment/Plan: S/p transsphenoidal resection of GH-secreting pituitary adenoma, doing well - downgrade to PCU - likely d/c tomorrow   Vallarie Mare 05/27/2020, 11:15 AM

## 2020-05-28 LAB — GLUCOSE, CAPILLARY
Glucose-Capillary: 170 mg/dL — ABNORMAL HIGH (ref 70–99)
Glucose-Capillary: 180 mg/dL — ABNORMAL HIGH (ref 70–99)
Glucose-Capillary: 181 mg/dL — ABNORMAL HIGH (ref 70–99)

## 2020-05-28 MED ORDER — DOCUSATE SODIUM 100 MG PO CAPS: 100.0000 mg | ORAL_CAPSULE | Freq: Two times a day (BID) | ORAL | 2 refills | Status: DC

## 2020-05-28 MED ORDER — HYDROCORTISONE 5 MG PO TABS: ORAL_TABLET | ORAL | 2 refills | Status: DC

## 2020-05-28 MED ORDER — HYDROCODONE-ACETAMINOPHEN 5-325 MG PO TABS: 1.0000 | ORAL_TABLET | ORAL | 0 refills | Status: DC | PRN

## 2020-05-28 NOTE — Discharge Instructions (Signed)
Sinus/Nasal Instructions:  1. Limited activity 2. Liquid and soft diet 3. May bathe and shower 4. Saline nasal spray - 4 puffs/nostril every hour while awake, begin the morning after surgery 5. Elevate Head of Bed 6. No nose blowing/Open mouth sneeze 7. Alternate Tylenol and ibuprofen every 6 hours as needed for pain.  Call [facility] for any questions or concerns nasal issues: [phone]  Drink water when thirsty Call office if persistent clear drainage from nose F/u with endocrinology within 1 month of surgery

## 2020-05-28 NOTE — Progress Notes (Signed)
   ENT Progress Note: POD #2 s/p Procedure(s): ENDOSCOPIC TRANSPHENOIDAL RESECTION OF TUMOR TRANSPHENOIDAL APPROACH EXPOSURE   Subjective: Patient complains of moderate nasal congestion, no significant discharge or bleeding  Objective: Vital signs in last 24 hours: Temp:  [98.2 F (36.8 C)-98.9 F (37.2 C)] 98.2 F (36.8 C) (12/17 0801) Pulse Rate:  [57-92] 57 (12/17 1000) Resp:  [16-27] 22 (12/17 1000) BP: (105-139)/(62-95) 133/94 (12/17 1000) SpO2:  [93 %-97 %] 96 % (12/17 1000) Arterial Line BP: (125-136)/(63-67) 125/63 (12/16 1200) Weight change:  Last BM Date:  (pta)  Intake/Output from previous day: 12/16 0701 - 12/17 0700 In: 1921.2 [P.O.:1500; I.V.:271.1; IV Piggyback:150.1] Out: 4650 [Urine:4650] Intake/Output this shift: Total I/O In: 220 [P.O.:200; I.V.:20] Out: 300 [Urine:300]  Labs: Recent Labs    05/27/20 0500  WBC 15.0*  HGB 14.5  HCT 42.4  PLT 231   Recent Labs    05/27/20 0500  NA 135  K 4.5  CL 101  CO2 16*  GLUCOSE 231*  BUN 25*  CALCIUM 9.0    Studies/Results: No results found.   PHYSICAL EXAM: Nasal passageway patent with some dry crusting.  No significant bleeding or discharge   Assessment/Plan: Patient stable after endoscopic transsphenoidal pituitary resection.  Patient doing well, tolerating oral diet, urine output stable.  Plan discharge to home today.  Plan follow-up to [facility] in 2 to 3 weeks for outpatient recheck.    Jerrell Belfast 05/28/2020, 10:41 AM

## 2020-05-28 NOTE — Discharge Summary (Signed)
  Physician Discharge Summary  Patient ID: Kevin Hampton MRN: [id number] DOB/AGE: May 18, 1966 54 y.o.  Admit date: 05/26/2020 Discharge date: 05/28/2020  Admission Diagnoses:  Acromegaly  Discharge Diagnoses:  Same Active Problems:   Acromegaly [facility])   Status post transsphenoidal pituitary resection [facility])   Discharged Condition: Stable  Hospital Course:  Kevin Hampton is a 54 y.o. male with acromegaly was found to have a pituitary macroadenoma with suprasellar extension and impingement of the optic chiasm.  He underwent transsphenoidal resection on 1215.  He tolerated the surgery well and was admitted to the neuro critical care unit for postoperative care.  He did not demonstrate signs or symptoms of DI and did not have any fluid leak from his nose.  He was neurologically intact and his visual fields were full.  He was deemed ready for discharge home on 05/28/2020  Treatments: Surgery - Endoscopic transsphenoidal resection of pituitary tumor  Discharge Exam: Blood pressure 118/75, pulse 82, temperature 98.2 F (36.8 C), temperature source Oral, resp. rate 18, height 6\' 4"  (1.93 m), weight 105.7 kg, SpO2 93 %. Awake, alert, oriented Speech fluent, appropriate CN grossly intact, VFC No drainage from nose 5/5 BUE/BLE Wound c/d/i  Disposition: Discharge disposition: 01-Home or Self Care        Allergies as of 05/28/2020      Reactions   Coreg [carvedilol] Other (See Comments)   Brings heart rate and blood pressure down severly      Medication List    TAKE these medications   amiodarone 200 MG tablet Commonly known as: PACERONE Take 1 tablet (200 mg total) by mouth daily.   dapagliflozin propanediol 10 MG Tabs tablet Commonly known as: Farxiga Take 1 tablet (10 mg total) by mouth daily before breakfast.   docusate sodium 100 MG capsule Commonly known as: COLACE Take 1 capsule (100 mg total) by mouth 2 (two) times daily.   Entresto 24-26 MG Generic drug:  sacubitril-valsartan Take 1 tablet by mouth twice daily   glipiZIDE 10 MG tablet Commonly known as: GLUCOTROL Take 1 tablet (10 mg total) by mouth 2 (two) times daily before a meal.   HYDROcodone-acetaminophen 5-325 MG tablet Commonly known as: NORCO/VICODIN Take 1 tablet by mouth every 4 (four) hours as needed for moderate pain.   hydrocortisone 5 MG tablet Commonly known as: Cortef 10 mg PO in morning, 5 mg PO at night   loratadine 10 MG tablet Commonly known as: CLARITIN Take 10 mg by mouth daily as needed for allergies.   metFORMIN 1000 MG tablet Commonly known as: GLUCOPHAGE Take 1 tablet (1,000 mg total) by mouth 2 (two) times daily with a meal.   spironolactone 25 MG tablet Commonly known as: ALDACTONE Take 1 tablet by mouth once daily       Follow-up Information    Jerrell Belfast, MD. Schedule an appointment as soon as possible for a visit in 3 weeks.   Specialty: Otolaryngology Contact information: [address] [place] [address] [phone]        Vallarie Mare, MD. Schedule an appointment as soon as possible for a visit in 2 week(s).   Specialty: Neurosurgery Contact information: [address] [place] [address] [phone]               Signed: Vallarie Mare 05/28/2020, 8:56 AM

## 2020-05-28 NOTE — Progress Notes (Signed)
Removed PIV. Gave discharge packet and instructions to patient. He verbalized understanding. Pushed in wheelchair to car by NT. Sent home with his wife.

## 2020-05-30 ENCOUNTER — Other Ambulatory Visit (HOSPITAL_COMMUNITY): Payer: Self-pay | Admitting: Internal Medicine

## 2020-05-31 ENCOUNTER — Telehealth: Payer: Self-pay

## 2020-05-31 NOTE — Telephone Encounter (Signed)
Transition Care Management Follow-up Telephone Call  Date of discharge and from where: 05/28/2020  How have you been since you were released from the hospital? He said that he remains somewhat congested since the surgery.   Any questions or concerns? Yes - he said that ENT gave him oxymetazoline nasal spay.  He thinks they told him to use it every hour but; he is not sure.  His wife has called the ENT office to confirm how often he is to use it and they are waiting for a call back   Items Reviewed:  Did the pt receive and understand the discharge instructions provided? Yes  he said that he has reviewed them a couple of times.   Medications obtained and verified? Yes  - he said he has all medications and did not have any questions about his med regime at this time  Other? No   Any new allergies since your discharge?  no  Do you have support at home? Yes   Home Care and Equipment/Supplies: Were home health services ordered?no If so, what is the name of the agency? n/a  Has the agency set up a time to come to the patient's home? n/a Were any new equipment or medical supplies ordered?  No What is the name of the medical supply agency? n/a Were you able to get the supplies/equipment? n/a Do you have any questions related to the use of the equipment or supplies? No, n/a  Functional Questionnaire: (I = Independent and D = Dependent) ADLs: independent  Follow up appointments reviewed:   PCP Hospital f/u appt confirmed? Dyann Kief, NP 06/25/2020  Specialist Hospital f/u appt confirmed? No  - he still needs to schedule appointments with Dr Marcello Moores and Dr Wilburn Cornelia.  Are transportation arrangements needed? Yes   If their condition worsens, is the pt aware to call PCP or go to the Emergency Dept.? yes  Was the patient provided with contact information for the PCP's office or ED? He has the phone number for the clinic  Was to pt encouraged to call back with questions or  concerns? yes

## 2020-06-23 ENCOUNTER — Encounter: Payer: Self-pay | Admitting: Internal Medicine

## 2020-06-24 ENCOUNTER — Ambulatory Visit (INDEPENDENT_AMBULATORY_CARE_PROVIDER_SITE_OTHER): Payer: Self-pay | Admitting: Internal Medicine

## 2020-06-24 ENCOUNTER — Encounter: Payer: Self-pay | Admitting: Internal Medicine

## 2020-06-24 ENCOUNTER — Other Ambulatory Visit: Payer: Self-pay

## 2020-06-24 VITALS — BP 128/82 | HR 87 | Ht 76.0 in | Wt 237.6 lb

## 2020-06-24 DIAGNOSIS — D352 Benign neoplasm of pituitary gland: Secondary | ICD-10-CM

## 2020-06-24 DIAGNOSIS — E0590 Thyrotoxicosis, unspecified without thyrotoxic crisis or storm: Secondary | ICD-10-CM

## 2020-06-24 DIAGNOSIS — E220 Acromegaly and pituitary gigantism: Secondary | ICD-10-CM

## 2020-06-24 LAB — BASIC METABOLIC PANEL
BUN: 18 mg/dL (ref 6–23)
CO2: 30 mEq/L (ref 19–32)
Calcium: 10.6 mg/dL — ABNORMAL HIGH (ref 8.4–10.5)
Chloride: 100 mEq/L (ref 96–112)
Creatinine, Ser: 1.17 mg/dL (ref 0.40–1.50)
GFR: 69.86 mL/min (ref >60.00)
Glucose, Bld: 219 mg/dL — ABNORMAL HIGH (ref 70–99)
Potassium: 5.3 mEq/L — ABNORMAL HIGH (ref 3.5–5.1)
Sodium: 138 mEq/L (ref 135–145)

## 2020-06-24 LAB — T4, FREE: Free T4: 2.26 ng/dL — ABNORMAL HIGH (ref 0.60–1.60)

## 2020-06-24 LAB — TSH: TSH: 0.16 u[IU]/mL — ABNORMAL LOW (ref 0.35–4.50)

## 2020-06-24 LAB — T3, FREE: T3, Free: 4.7 pg/mL — ABNORMAL HIGH (ref 2.3–4.2)

## 2020-06-24 LAB — CORTISOL: Cortisol, Plasma: 11.3 ug/dL

## 2020-06-24 NOTE — Progress Notes (Signed)
Patient ID: Kevin Hampton, male   DOB: 23-May-1966, 55 y.o.   MRN: [id number]   This visit occurred during the SARS-CoV-2 public health emergency.  Safety protocols were in place, including screening questions prior to the visit, additional usage of staff PPE, and extensive cleaning of exam room while observing appropriate contact time as indicated for disinfecting solutions.   HPI  Kevin Hampton is a 55 y.o.-year-old male, initially referred by his cardiologist, [name], returning for follow-up for acromegaly, diagnosed after last visit.  Last visit 1.5 months ago.  Since last visit, he had transsphenoidal surgery for acromegaly with Dr. Marcello Moores on 05/26/2020. This was originally planned for 04/15/2020, but he had COVID-19 at that time and the surgery was postponed.  He is feeling well after the surgery, with no complaints. He did not develop DI or SIADH after the surgery. Also, there is no significant congestion. However, he does report loss of taste and smell, initially with his COVID-19 infection, after which he started to recover, but lost these again after the surgery.  He had steroids after the treatment, hydrocortisone, 2 tablets in a.m. and 1 in p.m., stopped 6 days ago.  I reviewed the pathology of his pituitary tumor, and unfortunately, the report was very minimalistic (there was no staining for Ki67 and somatostatin receptors and no mention of granulation status): FINAL MICROSCOPIC DIAGNOSIS:   A. PITUITARY GLAND, TUMOR, RESECTION:  - Pituitary adenoma   GROSS DESCRIPTION:  Received fresh are fragments of soft tan-pink tissue measuring 1.8 x 1.7  x 0.3 cm in aggregate. The specimen is submitted in toto. (GRP 05/26/2020)   Postoperative sodium level was normal: Lab Results  Component Value Date   NA 135 05/27/2020   NA 138 05/20/2020   NA 140 04/06/2020   He saw ENT - Dr Wilburn Cornelia (note reviewed from 06/22/2020).  Reviewed history: Pt describes that in late 57s,  he noticed a change in his appearance: his jaw and forehead started to protrude.  He also has large fingers but he was quite obese and lost approximately 100 pounds approximately 10 to 15 years ago so he thought this was the cause why his fingers were larger.  He also had problems with increased spacing between his lower incisors. His son noticed a change in appearance and advised the patient to have his growth hormone checked.  Pt. was seen by [name] in 10/2019 and a growth hormone was checked at that time.  This returned elevated, so they pituitary MRI was ordered, but this was not done before last visit.  She was referred to endocrinology for investigation for acromegaly.  At last visit, he described: - + increase in ring size - + widening of spaces between teeth - + developing of an underbite - + deepening of the voice - no increase in shoe size - no headaches - + Increase sweating - no joint pain, only occasionally in fingers - + occasional constipation - + now only rarely snoring, but had OSA before losing weight - repeated sleep study >> normal - + weight loss - + fatigue, low energy - + cold intolerance only at night, occasionally - no vision loss, no double vision, he prev. wore readers  -not since 03/2019  He also has: - cardiomegaly with cardiomyopathy and CHF (EF 20 to 25%, per review of his most recent cardiac MRI report) - diabetes type 2 (dx'ed 2012), uncontrolled  At last visit, he was telling me that he felt much better  after he improved his diet and his sugars also improved. He was not tired anymore and was trying to stay active.  Reviewed pertinent labs:  He had high LH, FSH, ACTH, with normal cortisol and testosterone. He had very high IGF-I and growth hormone. we did not perform a growth hormone suppression test due to the high suspicion for acromegaly: Component     Latest Ref Rng & Units 11/24/2019  IGF-I, LC/MS     50 - 317 ng/mL 1,282 (H)  Z-Score  (Male)     -2.0 - 2 SD 6.0 (H)  TSH     0.35 - 4.50 uIU/mL 0.84  T4,Free(Direct)     0.60 - 1.60 ng/dL 2.00 (H)  Triiodothyronine,Free,Serum     2.3 - 4.2 pg/mL 4.3 (H)  Growth Hormone     < OR = 7.1 ng/mL 48.9 (H)  LH     1.50 - 9.30 mIU/mL 13.07 (H)  FSH     1.4 - 18.1 mIU/ML 53.9 (H)  Prolactin     2.0 - 18.0 ng/mL 14.3  Cortisol, Plasma     ug/dL 13.6  C206 ACTH     6 - 50 pg/mL 60 (H)  Testosterone, total     264.0 - 916.0 ng/dL 341.9  10/28/2019: Growth hormone 27.9 (0-10)  Pituitary MRI (01/09/2020) showed a macroadenoma: 2.5 cm macroadenoma with suprasellar extension and abutment of the inferior optic chiasm. Mild splaying of the pre-chiasmatic optic nerves.  Sella floor defect with extension of tumor into the right sphenoid sinus.  Bilateral cavernous sinus involvement without significant encasement or luminal narrowing of the intracavernous ICAs.  Nonspecific supratentorial white matter T2 hyperintensities. Differential includes minimal chronic microvascular ischemic changes versus post infectious/inflammatory sequela.  Osseous prominence of the bilateral supraorbital margin, anterior maxillary walls and mandibular condyles likely reflects sequela of acromegaly.      He has a slightly high free T4 but normal TSH: Lab Results  Component Value Date   TSH 0.84 11/24/2019   TSH 0.955 10/28/2019   TSH 0.845 05/01/2019   TSH 0.789 12/15/2018   FREET4 2.00 (H) 11/24/2019   FREET4 2.26 (H) 10/28/2019   FREET4 1.60 (H) 05/01/2019  He is on amiodarone.  No previous colonoscopies.  He has a history of nasal polyp surgeries and mentions that he has a lot of scar tissue in his nose.  He lost a significant amount of weight after he moved from [employer] to [place]. He attributed this to his diabetes. This was not an intentional.  No FH of DM, pituitary tumors.   He is a Theme park manager.  He also has uncontrolled diabetes, diagnosed in 2013, managed by  PCP.  Reviewed: Lab Results  Component Value Date   HGBA1C 9.0 (H) 05/20/2020   HGBA1C 10.4 (A) 03/01/2020   HGBA1C 11.8 (A) 12/05/2019   HGBA1C 8.7 (A) 09/10/2019   HGBA1C 10.0 (A) 03/17/2019   HGBA1C 12.7 (H) 12/15/2018   He is on: - Metformin 1000 mg 2x a day - Glipizide 10 mg 2x a day - Jardiance >> Invokana 100 mg before b'fast >> Januvia 100 mg >> Farxiga 10 mg before breakfast She refused injectable medications. In the past I recommended Basaglar 16 units at bedtime, but he did not start this.  No FH of DM2.  ROS: Constitutional: no weight gain/no weight loss, no fatigue, + subjective hyperthermia, no subjective hypothermia Eyes: no blurry vision, no xerophthalmia ENT: no sore throat, no nodules palpated in neck, no dysphagia, no odynophagia, no  hoarseness Cardiovascular: no CP/no SOB/no palpitations/no leg swelling Respiratory: no cough/no SOB/no wheezing Gastrointestinal: no N/no V/no D/+ C/+ acid reflux Musculoskeletal: no muscle aches/+ joint aches Skin: no rashes, no hair loss Neurological: + tremors/no numbness/no tingling/no dizziness  I reviewed pt's medications, allergies, PMH, social hx, family hx, and changes were documented in the history of present illness. Otherwise, unchanged from my initial visit note.  Past Medical History:  Diagnosis Date  . Acromegaly ([place])   . CHF (congestive heart failure) ([place])   . Essential hypertension   . Hypertensive emergency 12/15/2018  . Nonischemic cardiomyopathy ([place])   . Obesity   . Pituitary macroadenoma ([place])   . Systolic heart failure ([place])   . Type 2 diabetes mellitus with complication [facility])    Past Surgical History:  Procedure Laterality Date  . CRANIOTOMY N/A 05/26/2020   Procedure: ENDOSCOPIC TRANSPHENOIDAL RESECTION OF TUMOR;  Surgeon: Vallarie Mare, MD;  Location: [place];  Service: Neurosurgery;  Laterality: N/A;  . RIGHT/LEFT HEART CATH AND CORONARY ANGIOGRAPHY N/A 12/16/2018   Procedure: RIGHT/LEFT  HEART CATH AND CORONARY ANGIOGRAPHY;  Surgeon: Lorretta Harp, MD;  Location: [place] CV LAB;  Service: Cardiovascular;  Laterality: N/A;  . TRANSPHENOIDAL APPROACH EXPOSURE N/A 05/26/2020   Procedure: TRANSPHENOIDAL APPROACH EXPOSURE;  Surgeon: Jerrell Belfast, MD;  Location: [place];  Service: ENT;  Laterality: N/A;   Social History   Socioeconomic History  . Marital status: Married    Spouse name: Everlene Farrier  . Number of children: 1  . Years of education: Not on file  . Highest education level: Not on file  Occupational History  . Occupation: Minister/carpenter/musician  Tobacco Use  . Smoking status: Former Research scientist (life sciences)  . Smokeless tobacco: Never Used  Vaping Use  . Vaping Use: Never used  Substance and Sexual Activity  . Alcohol use: Never  . Drug use: Never  . Sexual activity: Yes  Other Topics Concern  . Not on file  Social History Narrative  . Not on file   Social Determinants of Health   Financial Resource Strain: Not on file  Food Insecurity: Not on file  Transportation Needs: Not on file  Physical Activity: Not on file  Stress: Not on file  Social Connections: Not on file  Intimate Partner Violence: Not on file   Current Outpatient Medications on File Prior to Visit  Medication Sig Dispense Refill  . amiodarone (PACERONE) 200 MG tablet Take 1 tablet by mouth once daily 90 tablet 0  . dapagliflozin propanediol (FARXIGA) 10 MG TABS tablet Take 1 tablet (10 mg total) by mouth daily before breakfast. 90 tablet 0  . docusate sodium (COLACE) 100 MG capsule Take 1 capsule (100 mg total) by mouth 2 (two) times daily. 60 capsule 2  . ENTRESTO 24-26 MG Take 1 tablet by mouth twice daily (Patient taking differently: Take 1 tablet by mouth 2 (two) times daily.) 60 tablet 11  . glipiZIDE (GLUCOTROL) 10 MG tablet Take 1 tablet (10 mg total) by mouth 2 (two) times daily before a meal. 180 tablet 1  . HYDROcodone-acetaminophen (NORCO/VICODIN) 5-325 MG tablet Take 1 tablet by  mouth every 4 (four) hours as needed for moderate pain. 30 tablet 0  . hydrocortisone (CORTEF) 5 MG tablet 10 mg PO in morning, 5 mg PO at night 90 tablet 2  . loratadine (CLARITIN) 10 MG tablet Take 10 mg by mouth daily as needed for allergies.    . metFORMIN (GLUCOPHAGE) 1000 MG tablet Take 1 tablet (1,000 mg  total) by mouth 2 (two) times daily with a meal. 180 tablet 1  . spironolactone (ALDACTONE) 25 MG tablet Take 1 tablet by mouth once daily (Patient taking differently: Take 25 mg by mouth daily.) 90 tablet 0   No current facility-administered medications on file prior to visit.   Allergies  Allergen Reactions  . Coreg [Carvedilol] Other (See Comments)    Brings heart rate and blood pressure down severly   Family History  Problem Relation Age of Onset  . Hypertension Mother   . Heart attack Mother   . Hypertension Father   . Hypertension Sister   . Hypertension Brother     PE: BP 128/82   Pulse 87   Ht _0  (1.93 m)   Wt 237 lb 9.6 oz (107.8 kg)   SpO2 97%   BMI 28.92 kg/m  Wt Readings from Last 3 Encounters:  06/24/20 237 lb 9.6 oz (107.8 kg)  05/26/20 233 lb (105.7 kg)  05/21/20 234 lb 12.8 oz (106.5 kg)   Constitutional: overweight, in NAD, + deep voice, + frontal bossing, + enlarged jaw with widely spaced incisors, + enlarged lower lip, + prominent nasolabial folds Eyes: PERRLA, EOMI, no exophthalmos ENT: moist mucous membranes, no thyromegaly, no cervical lymphadenopathy Cardiovascular: RRR, No MRG Respiratory: CTA B Gastrointestinal: abdomen soft, NT, ND, BS+ Musculoskeletal: + deformities (large jaw, fingers, forehead), strength intact in all 4 Skin: moist, warm, no rashes, + skin tags on neck Neurological: no tremor with outstretched hands, DTR normal in all 4  ASSESSMENT: 1.  Acromegaly  2.  Pituitary macroadenoma  PLAN:  1.and 2.   -Patient with approximately 20-year history of facial features changes and dramatic change in the appearance over the  years (per review of  pictures brought by patient).  He has all physical features of acromegaly (please see HPI and physical exam).  He also has cardiomegaly, which can be a feature of acromegaly, as well as uncontrolled diabetes and he is the only family member with this condition.  I first saw the patient after a growth hormone checked by [name] returned elevated.  At that time, we checked an IGF-I level along with growth hormone and these were both high.  He also had high LH, FSH, and ACTH.  We checked a pituitary MRI (01/09/2020) that showed a pituitary macroadenoma, measuring 2.5 cm, with suprasellar extension and abutting the inferior optic chiasm.  Also, the tumor eroded the sella floor and extending in the right sphenoid sinus. -We referred him to neurosurgery and he saw Dr. Marcello Moores.  Transsphenoidal surgery was planned for 04/15/2020, however, patient developed COVID-19 and the surgery had to be postponed.  He had this on 05/26/2020.  He tolerated the surgery well and  did not develop DI or SIADH after the surgery.  Also, no increased congestion or headaches. -At today's visit, we reviewed together the pathology of the tumor.  It appears that the tumor was 1.8 cm in the largest dimension and it was an adenoma, however, no other pathology information is offered.  I will try to discuss with the pathologist to see if staining can be performed for KI 67 and also for somatostatin receptors.  We did discuss at last visit and again today that was such a large tumor extending outside the pituitary sella, he most likely will require adjuvant treatment, usually with somatostatin analogs.  Staining for somatostatin receptors can give [place] a clue about the tumor sensitivity to these agents.  Another option is dopamine  agonist (Cabergoline), which could be tried individually, or is in addition to a somatostatin analog in case the response to these is incomplete.  Further treatment with growth hormone receptor  antagonist (Pegvisomant) or even radiation therapy (gamma knife therapy) may be needed in the future. -For now, we discussed about follow-up vascular surgery.  The growth hormone level is usually checked postoperatively and is ideally lower than 2 ng/mL fasting.  However, I do not see that this was checked in the hospital.  We will  check [place] today.  I will also add an IGF-I level, however, this takes longer to improve, up to 6 months after surgery.  If growth hormone is not suppressed, we may need to check an oral glucose tolerance test-I explained what this entails and why it is important: He would be given 75 g glucose - he needs to be here for 2 hours, with 30 minutes [place] checks - considered to be in remission if [place] is <1 (or, better, <0.4 ng/mL). -Since his ACTH was slightly high at last visit, I will recheck ACTH and cortisol now. He has been on hydrocortisone after the surgery, stopped 6 days ago. Since this has a short half-life, we can recheck his pituitary-adrenal axis today. His thyroid hormones were also abnormal, but this was most likely in the setting of amiodarone treatment.  However, I would like to recheck his TFTs today. -We discussed that his loss of taste and smell may take a longer time to recover. This could be related to his COVID-19 infection, but he does mention that this is starting to improve before his surgery, but he started to experience them again after the surgery -I plan to see him in 3 months and we will repeat the above investigation.  Around 12 weeks postoperatively, he will also need another pituitary MRI. He has an appointment coming up with Dr. Marcello Moores at the end of the month.  - Total time spent for the visit: 40 minutes, in obtaining medical information from the chart, reviewing his  previous surgery report, labs, imaging evaluations, and treatments, reviewing his symptoms, counseling him about his condition (please see the discussed topics above), and developing a plan to  further investigate and treat it; he had a number of questions which I addressed.  Addendum: I called and discussed with the pathologist (Dr. Thressa Sheller) - I will try to add several stains to the pituitary tumor slides: -Growth hormone granularity -Prolactin -Somatostatin receptors -Ki-67 The stains will confirm acromegaly ([place] staining), will assess for future response to Cabergoline and somatostatin analogs (prolactin and SSR) and will assess tumor aggressivity (Ki67).  Component     Latest Ref Rng & Units 06/24/2020          Sodium     135 - 145 mEq/L 138  Potassium     3.5 - 5.1 mEq/L 5.3 (H)  Chloride     96 - 112 mEq/L 100  CO2     19 - 32 mEq/L 30  Glucose     70 - 99 mg/dL 219 (H)  BUN     6 - 23 mg/dL 18  Creatinine     0.40 - 1.50 mg/dL 1.17  GFR     >60.00 mL/min 69.86  Calcium     8.4 - 10.5 mg/dL 10.6 (H)  IGF-I, LC/MS     50 - 317 ng/mL 1,094 (H)  Z-Score (Male)     -2.0 - 2 SD 5.5 (H)  Extra tube recieved  Specimen type recieved      Serum;  TSH     0.35 - 4.50 uIU/mL 0.16 (L)  T4,Free(Direct)     0.60 - 1.60 ng/dL 2.26 (H)  Triiodothyronine,Free,Serum     2.3 - 4.2 pg/mL 4.7 (H)  Growth Hormone     < OR = 7.1 ng/mL 7.0  Cortisol, Plasma     ug/dL 11.3  C206 ACTH     6 - 50 pg/mL 50   Potassium is slightly high.  I will forward the results to PCP.  He is on [place] and also [name].  I will advise him to stay well-hydrated. IGF-I slightly lower than before surgery, but still very high, >1000.  We will reassess at next visit since the levels decrease slowly, but I would have expected a lower level today, 1 month after the surgery. Growth hormone elevated, at 7.0, despite a high blood glucose of 219.  I do not feel that he needs an OGTT for now, since his glucose was already high at the time of the collection.  We can conclude that his surgery was not curative.  I will recheck these parameters at our next visit.  Afterwards, depending also on  the pathology stains, we will decide about further treatment. His TSH is slightly suppressed while free T4 and free T3 are high.  I believe that these are due to a combination of amiodarone and recent pituitary surgery.  Since the values are worse compared to last check, I will suggest to start a low-dose methimazole and I would like to recheck the tests in 3-4 weeks.  At that time, if not improved, will need to add prednisone.  I would like to avoid the steroid, if possible, due to his uncontrolled diabetes. Calcium is elevated, which is occasionally seen in the setting of acromegaly either due to high PTH or high calcitriol levels. I would like to recheck at next visit to see if further investigation for primary hyperparathyroidism is needed. His cortisol and ACTH levels are normal.  Philemon Kingdom, MD PhD [facility] Endocrinology

## 2020-06-24 NOTE — Patient Instructions (Signed)
Please stop at the lab.  Please come back for a follow-up appointment in 3 months  

## 2020-06-25 ENCOUNTER — Encounter: Payer: Self-pay | Admitting: Nurse Practitioner

## 2020-06-25 ENCOUNTER — Ambulatory Visit: Payer: Self-pay | Attending: Nurse Practitioner | Admitting: Nurse Practitioner

## 2020-06-25 ENCOUNTER — Telehealth (HOSPITAL_COMMUNITY): Payer: Self-pay | Admitting: Pharmacy Technician

## 2020-06-25 DIAGNOSIS — E785 Hyperlipidemia, unspecified: Secondary | ICD-10-CM

## 2020-06-25 DIAGNOSIS — E11630 Type 2 diabetes mellitus with periodontal disease: Secondary | ICD-10-CM

## 2020-06-25 MED ORDER — DAPAGLIFLOZIN PROPANEDIOL 10 MG PO TABS: 10.0000 mg | ORAL_TABLET | Freq: Every day | ORAL | 1 refills | Status: DC

## 2020-06-25 MED ORDER — ATORVASTATIN CALCIUM 20 MG PO TABS: 20.0000 mg | ORAL_TABLET | Freq: Every day | ORAL | 3 refills | Status: DC

## 2020-06-25 NOTE — Telephone Encounter (Signed)
Received a message from the patient's wife regarding his Entrseto. It looks like his 30 day co-pay is $200. She is concerned about pricing.   I called back, left a message suggesting that they use a co-pay which will drop the co-pay to $10. A 90 day supply would be the same price, as long as insurance allows 90 day billing.  Advised her to call back with any issues.  Charlann Boxer, CPhT

## 2020-06-25 NOTE — Progress Notes (Signed)
Virtual Visit via Telephone Note Due to national recommendations of social distancing due to [place] 19, telehealth visit is felt to be most appropriate for this patient at this time.  I discussed the limitations, risks, security and privacy concerns of performing an evaluation and management service by telephone and the availability of in person appointments. I also discussed with the patient that there may be a patient responsible charge related to this service. The patient expressed understanding and agreed to proceed.    I connected with [name] on 06/25/20  at  11:10 AM EST  EDT by telephone and verified that I am speaking with the correct person using two identifiers.   Consent I discussed the limitations, risks, security and privacy concerns of performing an evaluation and management service by telephone and the availability of in person appointments. I also discussed with the patient that there may be a patient responsible charge related to this service. The patient expressed understanding and agreed to proceed.   Location of Patient: Private Residence   Location of Provider: [place] and [employer] Office    Persons participating in Telemedicine visit: Geryl Rankins FNP-BC [place]    History of Present Illness: Telemedicine visit for: Follow Up He has a past medical history of pituitary tumor, left bundle branch block and chronic systolic heart failure for which she is being followed by [name]   Status post endoscopic transsphenoidal resection of pituitary tumor on 05/26/2020.  Doing well aside from nasal congestion and loss of taste and smell at this time.  CHF Taking Entresto 24-26 mg twice daily, amiodarone 200 mg daily and spironolactone 25 mg daily. Denies chest pain, shortness of breath, palpitations, lightheadedness, dizziness, headaches or BLE edema.    DM2 Fasting blood glucose level this morning 150.  He continues on  Farxiga 10 mg daily, glipizide 10 mg twice daily and metformin 1000 mg twice daily.  He is interested in stopping metformin due to information he has read regarding metformin and kidney failure.  I have assured him that we do monitor his kidney function/creatinine every few months and we would stop metformin if we see his creatinine get to a certain level.  LDL not at goal currently. Lab Results  Component Value Date   HGBA1C 9.0 (H) 05/20/2020   Lab Results  Component Value Date   [place] 92 04/06/2020   Past Medical History:  Diagnosis Date  . Acromegaly ([place])   . CHF (congestive heart failure) ([place])   . Essential hypertension   . Hypertensive emergency 12/15/2018  . Nonischemic cardiomyopathy ([place])   . Obesity   . Pituitary macroadenoma ([place])   . Systolic heart failure ([place])   . Type 2 diabetes mellitus with complication [facility])     Past Surgical History:  Procedure Laterality Date  . CRANIOTOMY N/A 05/26/2020   Procedure: ENDOSCOPIC TRANSPHENOIDAL RESECTION OF TUMOR;  Surgeon: Vallarie Mare, MD;  Location: [place];  Service: Neurosurgery;  Laterality: N/A;  . RIGHT/LEFT HEART CATH AND CORONARY ANGIOGRAPHY N/A 12/16/2018   Procedure: RIGHT/LEFT HEART CATH AND CORONARY ANGIOGRAPHY;  Surgeon: Lorretta Harp, MD;  Location: [place] [facility];  Service: Cardiovascular;  Laterality: N/A;  . TRANSPHENOIDAL APPROACH EXPOSURE N/A 05/26/2020   Procedure: TRANSPHENOIDAL APPROACH EXPOSURE;  Surgeon: Jerrell Belfast, MD;  Location: [place];  Service: ENT;  Laterality: N/A;    Family History  Problem Relation Age of Onset  . Hypertension Mother   . Heart attack Mother   .  Hypertension Father   . Hypertension Sister   . Hypertension Brother     Social History   Socioeconomic History  . Marital status: Married    Spouse name: Everlene Farrier  . Number of children: 1  . Years of education: Not on file  . Highest education level: Not on file  Occupational History  . Occupation:  Minister/carpenter/musician  Tobacco Use  . Smoking status: Former Research scientist (life sciences)  . Smokeless tobacco: Never Used  Vaping Use  . Vaping Use: Never used  Substance and Sexual Activity  . Alcohol use: Never  . Drug use: Never  . Sexual activity: Yes  Other Topics Concern  . Not on file  Social History Narrative  . Not on file   Social Determinants of Health   Financial Resource Strain: Not on file  Food Insecurity: Not on file  Transportation Needs: Not on file  Physical Activity: Not on file  Stress: Not on file  Social Connections: Not on file     Observations/Objective: Awake, alert and oriented x 3   Review of Systems  Constitutional: Negative for fever, malaise/fatigue and weight loss.       SEE HPI  HENT: Positive for congestion. Negative for nosebleeds.   Eyes: Negative.  Negative for blurred vision, double vision and photophobia.  Respiratory: Negative.  Negative for cough and shortness of breath.   Cardiovascular: Negative.  Negative for chest pain, palpitations and leg swelling.  Gastrointestinal: Negative.  Negative for heartburn, nausea and vomiting.  Musculoskeletal: Negative.  Negative for myalgias.  Neurological: Negative.  Negative for dizziness, focal weakness, seizures and headaches.  Psychiatric/Behavioral: Negative.  Negative for suicidal ideas.    Assessment and Plan: Itzae was seen today for follow-up.  Diagnoses and all orders for this visit:  Type 2 diabetes mellitus with periodontal disease, without long-term current use of insulin ([place]) -     dapagliflozin propanediol (FARXIGA) 10 MG TABS tablet; Take 1 tablet (10 mg total) by mouth daily before breakfast. Continue blood sugar control as discussed in office today, low carbohydrate diet, and regular physical exercise as tolerated, 150 minutes per week (30 min each day, 5 days per week, or 50 min 3 days per week). Keep blood sugar logs with fasting goal of 90-130 mg/dl, post prandial (after you eat) less  than 180.  For Hypoglycemia: BS <60 and Hyperglycemia BS >400; contact the clinic ASAP. Annual eye exams and foot exams are recommended.  Dyslipidemia, goal LDL below 70 -     atorvastatin (LIPITOR) 20 MG tablet; Take 1 tablet (20 mg total) by mouth daily. INSTRUCTIONS: Work on a low fat, heart healthy diet and participate in regular aerobic exercise program by working out at least 150 minutes per week; 5 days a week-30 minutes per day. Avoid red meat/beef/steak,  fried foods. junk foods, sodas, sugary drinks, unhealthy snacking, alcohol and smoking.  Drink at least 80 oz of water per day and monitor your carbohydrate intake daily.      Follow Up Instructions Return in about 10 weeks (around 09/03/2020).     I discussed the assessment and treatment plan with the patient. The patient was provided an opportunity to ask questions and all were answered. The patient agreed with the plan and demonstrated an understanding of the instructions.   The patient was advised to call back or seek an in-person evaluation if the symptoms worsen or if the condition fails to improve as anticipated.  I provided 14 minutes of non-face-to-face time during this encounter  including median intraservice time, reviewing previous notes, labs, imaging, medications and explaining diagnosis and management.  Gildardo Pounds, FNP-BC

## 2020-06-29 ENCOUNTER — Telehealth (HOSPITAL_COMMUNITY): Payer: Self-pay | Admitting: Pharmacy Technician

## 2020-06-29 LAB — EXTRA SPECIMEN

## 2020-06-29 LAB — INSULIN-LIKE GROWTH FACTOR
IGF-I, LC/MS: 1094 ng/mL — ABNORMAL HIGH (ref 50–317)
Z-Score (Male): 5.5 SD — ABNORMAL HIGH (ref ?–2.0)

## 2020-06-29 LAB — GROWTH HORMONE: Growth Hormone: 7 ng/mL (ref <=7.1)

## 2020-06-29 LAB — ACTH: C206 ACTH: 50 pg/mL (ref 6–50)

## 2020-06-29 NOTE — Telephone Encounter (Signed)
Received a message that the patient needed help with Kevin Hampton and [name] co-pay.  Called to make sure they received my message regarding co-pay cards. The patient confirmed that he did in fact, get a co-pay card for both medications.  Advised him to call me back with any issues.  Charlann Boxer, CPhT

## 2020-06-30 LAB — SURGICAL PATHOLOGY

## 2020-06-30 MED ORDER — METHIMAZOLE 5 MG PO TABS
5.0000 mg | ORAL_TABLET | Freq: Three times a day (TID) | ORAL | 3 refills | Status: DC
Start: 2020-06-30 — End: 2020-08-23

## 2020-07-12 ENCOUNTER — Other Ambulatory Visit: Payer: Self-pay | Admitting: Physician Assistant

## 2020-07-12 ENCOUNTER — Other Ambulatory Visit: Payer: Self-pay | Admitting: Family Medicine

## 2020-07-12 DIAGNOSIS — E11630 Type 2 diabetes mellitus with periodontal disease: Secondary | ICD-10-CM

## 2020-07-12 NOTE — Telephone Encounter (Signed)
Requested Prescriptions  Pending Prescriptions Disp Refills  . spironolactone (ALDACTONE) 25 MG tablet [Pharmacy Med Name: Spironolactone 25 MG Oral Tablet] 90 tablet 0    Sig: Take 1 tablet by mouth once daily     There is no refill protocol information for this order     

## 2020-07-14 ENCOUNTER — Other Ambulatory Visit: Payer: Self-pay | Admitting: Neurosurgery

## 2020-07-14 DIAGNOSIS — D497 Neoplasm of unspecified behavior of endocrine glands and other parts of nervous system: Secondary | ICD-10-CM

## 2020-08-03 ENCOUNTER — Ambulatory Visit
Admission: RE | Admit: 2020-08-03 | Discharge: 2020-08-03 | Disposition: A | Discharge status: Home / Self Care | Payer: 59 | Source: Ambulatory Visit | Attending: Neurosurgery | Admitting: Neurosurgery

## 2020-08-03 DIAGNOSIS — D497 Neoplasm of unspecified behavior of endocrine glands and other parts of nervous system: Secondary | ICD-10-CM

## 2020-08-03 MED ORDER — GADOBENATE DIMEGLUMINE 529 MG/ML IV SOLN
10.0000 mL | Freq: Once | INTRAVENOUS | Status: AC | PRN
  Administered 2020-08-03: 10 mL via INTRAVENOUS

## 2020-08-18 ENCOUNTER — Encounter: Payer: Self-pay | Admitting: Nurse Practitioner

## 2020-08-18 ENCOUNTER — Other Ambulatory Visit: Payer: Self-pay

## 2020-08-18 ENCOUNTER — Ambulatory Visit: Payer: Self-pay | Attending: Nurse Practitioner | Admitting: Nurse Practitioner

## 2020-08-18 ENCOUNTER — Ambulatory Visit: Payer: Self-pay | Admitting: Nurse Practitioner

## 2020-08-18 VITALS — BP 144/88 | HR 72 | Resp 16 | Wt 247.8 lb

## 2020-08-18 DIAGNOSIS — Z1211 Encounter for screening for malignant neoplasm of colon: Secondary | ICD-10-CM

## 2020-08-18 DIAGNOSIS — E11630 Type 2 diabetes mellitus with periodontal disease: Secondary | ICD-10-CM

## 2020-08-18 DIAGNOSIS — E876 Hypokalemia: Secondary | ICD-10-CM

## 2020-08-18 DIAGNOSIS — I10 Essential (primary) hypertension: Secondary | ICD-10-CM

## 2020-08-18 DIAGNOSIS — D72829 Elevated white blood cell count, unspecified: Secondary | ICD-10-CM

## 2020-08-18 DIAGNOSIS — E1165 Type 2 diabetes mellitus with hyperglycemia: Secondary | ICD-10-CM

## 2020-08-18 LAB — POCT GLYCOSYLATED HEMOGLOBIN (HGB A1C): HbA1c, POC (controlled diabetic range): 8.1 % — AB (ref 0.0–7.0)

## 2020-08-18 LAB — GLUCOSE, POCT (MANUAL RESULT ENTRY): POC Glucose: 205 mg/dl — AB (ref 70–99)

## 2020-08-18 NOTE — Progress Notes (Signed)
Assessment & Plan:  Kevin Hampton was seen today for diabetes and hypertension.  Diagnoses and all orders for this visit:  Type 2 diabetes mellitus with hyperglycemia, without long-term current use of insulin (HCC) -     POCT glucose (manual entry) -     POCT glycosylated hemoglobin (Hb A1C)  Essential hypertension Continue all antihypertensives as prescribed.  Remember to bring in your blood pressure log with you for your follow up appointment.  DASH/Mediterranean Diets are healthier choices for HTN.    Leukocytosis, unspecified type -     CBC with Differential  Hypokalemia -     Basic metabolic panel  Colon cancer screening -     Fecal occult blood, imunochemical([facility])    Patient has been counseled on age-appropriate routine health concerns for screening and prevention. These are reviewed and up-to-date. Referrals have been placed accordingly. Immunizations are up-to-date or declined.    Subjective:   Chief Complaint  Patient presents with  . Diabetes  . Hypertension   HPI Kevin Hampton 55 y.o. male presents to office today for follow up.  S/P transsphenoidal surgery for acromegaly on 05-26-2020. He did briefly require steroids post procedure but has completed therapy.    DM 2 Poorly controlled. He declines injectables. This could likely be related to previous steroid use but also a component of dietary non adherence. Past 2 months average fasting readings: 120-135s. He has not checked any postprandial readings. I have recommended he check both due to high A1c.  He endorses medication adherence taking glipizide 10 mg twice daily, Metformin 1000 mg twice daily, Farxiga 10 mg daily.  LDL not at goal with atorvastatin 20 mg daily. He will try to make dietary changes and will start monitoring postprandial readings. Return in 4 weeks for meter check.  Lab Results  Component Value Date   HGBA1C 8.1 (A) 08/18/2020   Lab Results  Component Value Date   LDLCALC 92  04/06/2020    BP Not at goal today. He is currently taking spironolactone 25 mg daily, Entresto 24-26 mg twice daily, amiodarone 200 mg. Denies chest pain, shortness of breath, palpitations, lightheadedness, dizziness, headaches or BLE edema.  BP Readings from Last 3 Encounters:  08/18/20 (!) 144/88  06/24/20 128/82  05/28/20 (!) 133/94    Review of Systems  Constitutional: Negative for fever, malaise/fatigue and weight loss.  HENT: Negative.  Negative for nosebleeds.   Eyes: Negative.  Negative for blurred vision, double vision and photophobia.  Respiratory: Negative.  Negative for cough and shortness of breath.   Cardiovascular: Negative.  Negative for chest pain, palpitations and leg swelling.  Gastrointestinal: Negative.  Negative for heartburn, nausea and vomiting.  Musculoskeletal: Negative.  Negative for myalgias.  Neurological: Negative.  Negative for dizziness, focal weakness, seizures and headaches.  Psychiatric/Behavioral: Negative.  Negative for suicidal ideas.    Past Medical History:  Diagnosis Date  . Acromegaly ([place])   . CHF (congestive heart failure) ([place])   . Essential hypertension   . Hypertensive emergency 12/15/2018  . Nonischemic cardiomyopathy ([place])   . Obesity   . Pituitary macroadenoma ([place])   . Systolic heart failure ([place])   . Type 2 diabetes mellitus with complication [facility])     Past Surgical History:  Procedure Laterality Date  . CRANIOTOMY N/A 05/26/2020   Procedure: ENDOSCOPIC TRANSPHENOIDAL RESECTION OF TUMOR;  Surgeon: Kevin Mare, MD;  Location: [place];  Service: Neurosurgery;  Laterality: N/A;  . RIGHT/LEFT HEART CATH AND CORONARY ANGIOGRAPHY N/A 12/16/2018  Procedure: RIGHT/LEFT HEART CATH AND CORONARY ANGIOGRAPHY;  Surgeon: Kevin Harp, MD;  Location: [place] [facility];  Service: Cardiovascular;  Laterality: N/A;  . TRANSPHENOIDAL APPROACH EXPOSURE N/A 05/26/2020   Procedure: TRANSPHENOIDAL APPROACH EXPOSURE;  Surgeon: Kevin Belfast, MD;  Location: [place];  Service: ENT;  Laterality: N/A;    Family History  Problem Relation Age of Onset  . Hypertension Mother   . Heart attack Mother   . Hypertension Father   . Hypertension Sister   . Hypertension Brother     Social History Reviewed with no changes to be made today.   Outpatient Medications Prior to Visit  Medication Sig Dispense Refill  . amiodarone (PACERONE) 200 MG tablet Take 1 tablet by mouth once daily 90 tablet 0  . atorvastatin (LIPITOR) 20 MG tablet Take 1 tablet (20 mg total) by mouth daily. (Patient not taking: Reported on 08/18/2020) 90 tablet 3  . dapagliflozin propanediol (FARXIGA) 10 MG TABS tablet Take 1 tablet (10 mg total) by mouth daily before breakfast. 90 tablet 1  . ENTRESTO 24-26 MG Take 1 tablet by mouth twice daily (Patient taking differently: Take 1 tablet by mouth 2 (two) times daily.) 60 tablet 11  . glipiZIDE (GLUCOTROL) 10 MG tablet Take 1 tablet (10 mg total) by mouth 2 (two) times daily before a meal. 180 tablet 1  . loratadine (CLARITIN) 10 MG tablet Take 10 mg by mouth daily as needed for allergies. (Patient not taking: Reported on 08/18/2020)    . metFORMIN (GLUCOPHAGE) 500 MG tablet TAKE 2 TABLETS BY MOUTH TWICE DAILY WITH A MEAL 120 tablet 1  . methimazole (TAPAZOLE) 5 MG tablet Take 1 tablet (5 mg total) by mouth 3 (three) times daily. (Patient not taking: Reported on 08/18/2020) 30 tablet 3  . spironolactone (ALDACTONE) 25 MG tablet Take 1 tablet by mouth once daily 90 tablet 0  . docusate sodium (COLACE) 100 MG capsule Take 1 capsule (100 mg total) by mouth 2 (two) times daily. (Patient not taking: No sig reported) 60 capsule 2   No facility-administered medications prior to visit.    Allergies  Allergen Reactions  . Coreg [Carvedilol] Other (See Comments)    Brings heart rate and blood pressure down severly       Objective:    BP (!) 144/88   Pulse 72   Resp 16   Wt 247 lb 12.8 oz (112.4 kg)   SpO2 99%   BMI  30.16 kg/m  Wt Readings from Last 3 Encounters:  08/18/20 247 lb 12.8 oz (112.4 kg)  06/24/20 237 lb 9.6 oz (107.8 kg)  05/26/20 233 lb (105.7 kg)    Physical Exam Vitals and nursing note reviewed.  Constitutional:      Appearance: He is well-developed and well-nourished.  HENT:     Head: Normocephalic and atraumatic.  Eyes:     Extraocular Movements: EOM normal.  Cardiovascular:     Rate and Rhythm: Normal rate and regular rhythm.     Pulses: Intact distal pulses.     Heart sounds: Normal heart sounds. No murmur heard. No friction rub. No gallop.   Pulmonary:     Effort: Pulmonary effort is normal. No tachypnea or respiratory distress.     Breath sounds: Normal breath sounds. No decreased breath sounds, wheezing, rhonchi or rales.  Chest:     Chest wall: No tenderness.  Abdominal:     General: Bowel sounds are normal.     Palpations: Abdomen is soft.  Musculoskeletal:        General: No edema. Normal range of motion.     Cervical back: Normal range of motion.  Skin:    General: Skin is warm and dry.  Neurological:     Mental Status: He is alert and oriented to person, place, and time.     Coordination: Coordination normal.  Psychiatric:        Mood and Affect: Mood and affect normal.        Behavior: Behavior normal. Behavior is cooperative.        Thought Content: Thought content normal.        Judgment: Judgment normal.          Patient has been counseled extensively about nutrition and exercise as well as the importance of adherence with medications and regular follow-up. The patient was given clear instructions to go to ER or return to medical center if symptoms don't improve, worsen or new problems develop. The patient verbalized understanding.   Follow-up: Return for 4 weeks luke meter check. See me in 3 months.Gildardo Pounds, FNP-BC [facility] [place], [place]   08/18/2020, 12:43 PM

## 2020-08-19 LAB — BASIC METABOLIC PANEL
BUN/Creatinine Ratio: 15 (ref 9–20)
BUN: 19 mg/dL (ref 6–24)
CO2: 24 mmol/L (ref 20–29)
Calcium: 10.2 mg/dL (ref 8.7–10.2)
Chloride: 99 mmol/L (ref 96–106)
Creatinine, Ser: 1.31 mg/dL — ABNORMAL HIGH (ref 0.76–1.27)
Glucose: 158 mg/dL — ABNORMAL HIGH (ref 65–99)
Potassium: 5.4 mmol/L — ABNORMAL HIGH (ref 3.5–5.2)
Sodium: 140 mmol/L (ref 134–144)
eGFR: 64 mL/min/{1.73_m2} (ref >59)

## 2020-08-19 LAB — CBC WITH DIFFERENTIAL/PLATELET
Basophils Absolute: 0 10*3/uL (ref 0.0–0.2)
Basos: 0 %
EOS (ABSOLUTE): 0.2 10*3/uL (ref 0.0–0.4)
Eos: 2 %
Hematocrit: 53.2 % — ABNORMAL HIGH (ref 37.5–51.0)
Hemoglobin: 18 g/dL — ABNORMAL HIGH (ref 13.0–17.7)
Immature Grans (Abs): 0 10*3/uL (ref 0.0–0.1)
Immature Granulocytes: 1 %
Lymphocytes Absolute: 2.1 10*3/uL (ref 0.7–3.1)
Lymphs: 31 %
MCH: 31 pg (ref 26.6–33.0)
MCHC: 33.8 g/dL (ref 31.5–35.7)
MCV: 92 fL (ref 79–97)
Monocytes Absolute: 0.7 10*3/uL (ref 0.1–0.9)
Monocytes: 10 %
Neutrophils Absolute: 3.7 10*3/uL (ref 1.4–7.0)
Neutrophils: 56 %
Platelets: 224 10*3/uL (ref 150–450)
RBC: 5.81 x10E6/uL — ABNORMAL HIGH (ref 4.14–5.80)
RDW: 12.6 % (ref 11.6–15.4)
WBC: 6.7 10*3/uL (ref 3.4–10.8)

## 2020-08-22 NOTE — Progress Notes (Signed)
ADVANCED HF CLINIC NOTE  HPI:  Kevin Hampton is a 55 yo male with  HTN, poorly-controlled, DM2, acromegaly and systolic HF due to NICM.   He was admitted 7/20 with hypertensive urgency and acute systolic HF. ECG showed LBBB of uncertain duration. Echo EF 25-30%. Cardiac catheterization on 12/16/18 showed normal coronary arteries with well compensated filling pressures after diuresis.   He was started on GDMT including carvedilol 6.25 bid, Entresto 49/51 bid, spironolactone 12.5 daily. He did well for several days but then developed severe fatigue. I saw him in Clinic on 12/27/18. He was in bigeminy. I stopped his b-blocker and started amio 200 bid.K 5.0 Mg 1.7  Echo 9/21: EF 30-35%, RV ok.  Remains on amio to suppress PVCs. 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 Referred to Dr. Monna Fam. Underwent transphenoidal pituitary resection in 12/21  Here for f/u. Says he feels pretty good. Doing what he wants to do. No CP , orthopnea or PND. No CP. No problems with meds.   Echo today 08/23/20: EF 20-25% + septal dyssynchrony RV ok. Personally reviewed  Studies  cMRI 10/21/19 1. Very difficult study due to frequent ectopy and respiratory artifact. 2. Moderately dilated left ventricle with estimated EF 20-25% (unable to quantify due to respiratory artifact and ectopy). Diffuse hypokinesis with basal to mid inferolateral akinesis. 3.  Normal RV size and systolic function. 4. Delayed enhancement images were also very difficult. However, there was clearly a dense basal to mid inferolateral scar. This looked most consistent with a prior MI in the LCx territory.  Echo 9/21: EF 30-35%, RV ok.  Echo 09/03/19: EF 25% RV ok. Personally reviewed  Echo 11/19 EF ~35-40% with severe dyssynchrony due to LBBB and PVCs Personally reviewed  Zio patch 10/20: 4.8%PVCs  SH:  Social History   Socioeconomic History  . Marital status: Married    Spouse name: Everlene Farrier  . Number of children: 1  . Years of  education: Not on file  . Highest education level: Not on file  Occupational History  . Occupation: Minister/carpenter/musician  Tobacco Use  . Smoking status: Former Research scientist (life sciences)  . Smokeless tobacco: Never Used  Vaping Use  . Vaping Use: Never used  Substance and Sexual Activity  . Alcohol use: Never  . Drug use: Never  . Sexual activity: Yes  Other Topics Concern  . Not on file  Social History Narrative  . Not on file   Social Determinants of Health   Financial Resource Strain: Not on file  Food Insecurity: Not on file  Transportation Needs: Not on file  Physical Activity: Not on file  Stress: Not on file  Social Connections: Not on file  Intimate Partner Violence: Not on file    FH:  Family History  Problem Relation Age of Onset  . Hypertension Mother   . Heart attack Mother   . Hypertension Father   . Hypertension Sister   . Hypertension Brother     Past Medical History:  Diagnosis Date  . Acromegaly ([place])   . CHF (congestive heart failure) ([place])   . Essential hypertension   . Hypertensive emergency 12/15/2018  . Nonischemic cardiomyopathy ([place])   . Obesity   . Pituitary macroadenoma ([place])   . Systolic heart failure ([place])   . Type 2 diabetes mellitus with complication [facility])     Current Outpatient Medications  Medication Sig Dispense Refill  . amiodarone (PACERONE) 200 MG tablet Take 1 tablet by mouth once daily 90  tablet 0  . atorvastatin (LIPITOR) 20 MG tablet Take 1 tablet (20 mg total) by mouth daily. (Patient not taking: Reported on 08/18/2020) 90 tablet 3  . dapagliflozin propanediol (FARXIGA) 10 MG TABS tablet Take 1 tablet (10 mg total) by mouth daily before breakfast. 90 tablet 1  . ENTRESTO 24-26 MG Take 1 tablet by mouth twice daily (Patient taking differently: Take 1 tablet by mouth 2 (two) times daily.) 60 tablet 11  . glipiZIDE (GLUCOTROL) 10 MG tablet Take 1 tablet (10 mg total) by mouth 2 (two) times daily before a meal. 180 tablet 1  . loratadine  (CLARITIN) 10 MG tablet Take 10 mg by mouth daily as needed for allergies. (Patient not taking: Reported on 08/18/2020)    . metFORMIN (GLUCOPHAGE) 500 MG tablet TAKE 2 TABLETS BY MOUTH TWICE DAILY WITH A MEAL 120 tablet 1  . methimazole (TAPAZOLE) 5 MG tablet Take 1 tablet (5 mg total) by mouth 3 (three) times daily. (Patient not taking: Reported on 08/18/2020) 30 tablet 3  . spironolactone (ALDACTONE) 25 MG tablet Take 1 tablet by mouth once daily 90 tablet 0   No current facility-administered medications for this encounter.    Vitals:   08/23/20 1052  BP: (!) 142/70  Pulse: 75  SpO2: 99%  Weight: 112.9 kg (249 lb)   Wt Readings from Last 3 Encounters:  08/18/20 112.4 kg (247 lb 12.8 oz)  06/24/20 107.8 kg (237 lb 9.6 oz)  05/26/20 105.7 kg (233 lb)    PHYSICAL EXAM: General:  Well appearing. No resp difficulty HEENT: normal Neck: supple. no JVD. Carotids 2+ bilat; no bruits. No lymphadenopathy or thryomegaly appreciated. Cor: PMI nondisplaced. Regular rate & rhythm. No rubs, gallops or murmurs. Lungs: clear Abdomen: soft, nontender, nondistended. No hepatosplenomegaly. No bruits or masses. Good bowel sounds. Extremities: no cyanosis, clubbing, rash, edema Neuro: alert & orientedx3, cranial nerves grossly intact. moves all 4 extremities w/o difficulty. Affect pleasant  ASSESSMENT & PLAN:  1. Chronic systolic HF - diagnosed 2/50 EF ~25-30%. NICM - cath 7/20 with normal coronaries and compensated hemodynamics - suspect he likely has LBBB +/- PVC-induced CM  - Echo 09/03/19 EF ~25% with severe LV dyssynchrony (no improvement with PVC suppression) - Zio patch 10/20 with 4.8% PVC  - cMRI 5/21 EF 20-25% suggestive of PVC/LBBB CM but presence of ? Inferolateral scar is puzzling.  - Unclear if cardiomyopathy related to PVCs, LBBB (or both). EF initially did not get better with PVC suppression.  - Seen by EP for consideration of CRT, felt due to lack of significant symptoms and no  activity limitations, low risk for cardiac arrest and would defer device at this time pending ongoing EF improvement. - Echo 9/21 EF 30-35%, severe decrease LV function with dyssynchrony, RV normal - Echo today 08/23/20: EF 20-25% + septal dyssynchrony RV ok. Personally reviewed - NYHA II. Volume status ok  - Continue Entresto 24/26 mg BID (did not tolerate 49/51 dose. Wants to hold off on increasing for now) - On amio 200 mg daily for PVC suppression. Will decrease amio to 100 daily.  - Continue spiro 25 mg daily. - Continue Farxiga 10 mg daily. - He remains unwilling to retry b-blockers because of how bad he felt in past. He may reconside after surgery.  - Echo today shows worsening LV function in setting of LBBB and significant LV dyssynchrony. Will refer back to Dr. Caryl Comes for reconsideration of CRT.  - Potassium was 5.4 on 08/18/20. Recheck 1 week  2. Frequent PVCs - Suppressed with amio. None on ECG today, no palpitations. - Decrease amio to 100 mg daily - aware need for yearly eye exams - zio 10/20 4.8%  - Can consider switch to mexilitene as needed.  - home sleep study with AHI 3. (No significant OSA)  3. HTN - Blood pressure well controlled. Continue current regimen. - continue home BP checks.  4. LBBB - discussion as above. Has seen EP and CRT consideration deferred.  - EF worse will refer back to Dr. Caryl Comes  5. Poorly controlled DM2 - Per his PCP. HgBA1c was 11.8 on 6/21 - Continue Farxiga - Hgba1c improving. Recent A1c was 8.1 on 08/18/20  6. Acromegaly - has growth hormone excess - S/p  transphenoid pituitary resection surgery & sinus septoplasty 12/21  Glori Bickers, MD  10:37 PM

## 2020-08-23 ENCOUNTER — Ambulatory Visit (HOSPITAL_BASED_OUTPATIENT_CLINIC_OR_DEPARTMENT_OTHER)
Admission: RE | Admit: 2020-08-23 | Discharge: 2020-08-23 | Disposition: A | Discharge status: Home / Self Care | Payer: 59 | Source: Ambulatory Visit

## 2020-08-23 ENCOUNTER — Encounter (HOSPITAL_COMMUNITY): Payer: Self-pay | Admitting: Internal Medicine

## 2020-08-23 ENCOUNTER — Other Ambulatory Visit: Payer: Self-pay

## 2020-08-23 ENCOUNTER — Other Ambulatory Visit (HOSPITAL_COMMUNITY): Payer: BC Managed Care – PPO

## 2020-08-23 ENCOUNTER — Other Ambulatory Visit: Payer: Self-pay | Admitting: Nurse Practitioner

## 2020-08-23 ENCOUNTER — Ambulatory Visit (HOSPITAL_COMMUNITY)
Admission: RE | Admit: 2020-08-23 | Discharge: 2020-08-23 | Disposition: A | Discharge status: Home / Self Care | Payer: 59 | Source: Ambulatory Visit | Attending: Internal Medicine | Admitting: Internal Medicine

## 2020-08-23 VITALS — BP 142/70 | HR 75 | Wt 249.0 lb

## 2020-08-23 DIAGNOSIS — I493 Ventricular premature depolarization: Secondary | ICD-10-CM

## 2020-08-23 DIAGNOSIS — Z87891 Personal history of nicotine dependence: Secondary | ICD-10-CM | POA: Diagnosis not present

## 2020-08-23 DIAGNOSIS — Z79899 Other long term (current) drug therapy: Secondary | ICD-10-CM | POA: Insufficient documentation

## 2020-08-23 DIAGNOSIS — Z8249 Family history of ischemic heart disease and other diseases of the circulatory system: Secondary | ICD-10-CM | POA: Diagnosis not present

## 2020-08-23 DIAGNOSIS — I428 Other cardiomyopathies: Secondary | ICD-10-CM | POA: Diagnosis not present

## 2020-08-23 DIAGNOSIS — E1165 Type 2 diabetes mellitus with hyperglycemia: Secondary | ICD-10-CM | POA: Insufficient documentation

## 2020-08-23 DIAGNOSIS — E220 Acromegaly and pituitary gigantism: Secondary | ICD-10-CM | POA: Diagnosis not present

## 2020-08-23 DIAGNOSIS — I5022 Chronic systolic (congestive) heart failure: Secondary | ICD-10-CM

## 2020-08-23 DIAGNOSIS — Z7984 Long term (current) use of oral hypoglycemic drugs: Secondary | ICD-10-CM | POA: Diagnosis not present

## 2020-08-23 DIAGNOSIS — I110 Hypertensive heart disease with heart failure: Secondary | ICD-10-CM | POA: Insufficient documentation

## 2020-08-23 DIAGNOSIS — E875 Hyperkalemia: Secondary | ICD-10-CM

## 2020-08-23 DIAGNOSIS — I447 Left bundle-branch block, unspecified: Secondary | ICD-10-CM | POA: Insufficient documentation

## 2020-08-23 LAB — ECHOCARDIOGRAM COMPLETE
Area-P 1/2: 5.23 cm2
Calc EF: 18.8 %
S' Lateral: 5.3 cm
Single Plane A2C EF: 27.4 %
Single Plane A4C EF: 12.1 %

## 2020-08-23 MED ORDER — AMIODARONE HCL 200 MG PO TABS: 100.0000 mg | ORAL_TABLET | Freq: Every day | ORAL | 0 refills | Status: DC

## 2020-08-23 NOTE — Patient Instructions (Signed)
Decrease Amiodarone to 100 mg (1/2 tab) Daily  Your physician recommends that you return for lab work in: 1 week, we have provided you a prescription to have this done locally  You have been referred to follow up with Dr Caryl Comes, his office will call you for an appointment  Please call our office in May to schedule your follow up appointment for July 2022  If you have any questions or concerns before your next appointment please send [place] a message through [place] or call our office at [phone].    TO LEAVE A MESSAGE FOR THE NURSE SELECT OPTION 2, PLEASE LEAVE A MESSAGE INCLUDING: . YOUR NAME . DATE OF BIRTH . CALL BACK NUMBER . REASON FOR CALL**this is important as we prioritize the call backs  [place] AS LONG AS YOU CALL BEFORE 4:00 PM  At the [facility], you and your health needs are our priority. As part of our continuing mission to provide you with exceptional heart care, we have created designated Provider Care Teams. These Care Teams include your primary Cardiologist (physician) and Advanced Practice Providers (APPs- Physician Assistants and Nurse Practitioners) who all work together to provide you with the care you need, when you need it.   You may see any of the following providers on your designated Care Team at your next follow up: Marland Kitchen Dr Glori Bickers . Dr Loralie Champagne . Dr Vickki Muff . Darrick Grinder, NP . Lyda Jester, [place] . Audry Riles, PharmD   Please be sure to bring in all your medications bottles to every appointment.

## 2020-08-23 NOTE — Addendum Note (Signed)
Encounter addended by: Scarlette Calico, RN on: 08/23/2020 11:59 AM  Actions taken: Order list changed, Diagnosis association updated

## 2020-08-23 NOTE — Addendum Note (Signed)
Encounter addended by: Scarlette Calico, RN on: 08/23/2020 11:51 AM  Actions taken: Diagnosis association updated, Clinical Note Signed, Order list changed

## 2020-08-23 NOTE — Progress Notes (Signed)
  Echocardiogram 2D Echocardiogram has been performed.  Kevin Hampton 08/23/2020, 10:38 AM

## 2020-08-31 ENCOUNTER — Other Ambulatory Visit (HOSPITAL_COMMUNITY): Payer: 59

## 2020-09-02 ENCOUNTER — Telehealth: Payer: Self-pay

## 2020-09-02 ENCOUNTER — Other Ambulatory Visit: Payer: Self-pay

## 2020-09-02 ENCOUNTER — Telehealth (INDEPENDENT_AMBULATORY_CARE_PROVIDER_SITE_OTHER): Payer: 59 | Admitting: Internal Medicine

## 2020-09-02 VITALS — Ht 76.0 in | Wt 245.0 lb

## 2020-09-02 DIAGNOSIS — I447 Left bundle-branch block, unspecified: Secondary | ICD-10-CM | POA: Diagnosis not present

## 2020-09-02 DIAGNOSIS — D497 Neoplasm of unspecified behavior of endocrine glands and other parts of nervous system: Secondary | ICD-10-CM | POA: Diagnosis not present

## 2020-09-02 DIAGNOSIS — I5022 Chronic systolic (congestive) heart failure: Secondary | ICD-10-CM

## 2020-09-02 NOTE — Patient Instructions (Signed)
Medication Instructions:  Your physician recommends that you continue on your current medications as directed. Please refer to the Current Medication list given to you today.  *If you need a refill on your cardiac medications before your next appointment, please call your pharmacy*   Lab Work: None ordered.  If you have labs (blood work) drawn today and your tests are completely normal, you will receive your results only by: Marland Kitchen MyChart Message (if you have MyChart) OR . A paper copy in the mail If you have any lab test that is abnormal or we need to change your treatment, we will call you to review the results.   Testing/Procedures: None ordered.    Follow-Up: At [facility], you and your health needs are our priority.  As part of our continuing mission to provide you with exceptional heart care, we have created designated Provider Care Teams.  These Care Teams include your primary Cardiologist (physician) and Advanced Practice Providers (APPs -  Physician Assistants and Nurse Practitioners) who all work together to provide you with the care you need, when you need it.  We recommend signing up for the patient portal called "MyChart".  Sign up information is provided on this After Visit Summary.  MyChart is used to connect with patients for Virtual Visits (Telemedicine).  Patients are able to view lab/test results, encounter notes, upcoming appointments, etc.  Non-urgent messages can be sent to your provider as well.   To learn more about what you can do with MyChart, go to [email or web address].    Your next appointment:   4 month(s)  The format for your next appointment:   In Person  Provider:   Virl Axe, MD

## 2020-09-02 NOTE — Telephone Encounter (Signed)
  Patient Consent for Virtual Visit         Kevin Hampton has provided verbal consent on 09/02/2020 for a virtual visit (video or telephone).   CONSENT FOR VIRTUAL VISIT FOR:  Kevin Hampton  By participating in this virtual visit I agree to the following:  I hereby voluntarily request, consent and authorize [facility] and its employed or contracted physicians, physician assistants, nurse practitioners or other licensed health care professionals (the Practitioner), to provide me with telemedicine health care services (the "Services") as deemed necessary by the treating Practitioner. I acknowledge and consent to receive the Services by the Practitioner via telemedicine. I understand that the telemedicine visit will involve communicating with the Practitioner through live audiovisual communication technology and the disclosure of certain medical information by electronic transmission. I acknowledge that I have been given the opportunity to request an in-person assessment or other available alternative prior to the telemedicine visit and am voluntarily participating in the telemedicine visit.  I understand that I have the right to withhold or withdraw my consent to the use of telemedicine in the course of my care at any time, without affecting my right to future care or treatment, and that the Practitioner or I may terminate the telemedicine visit at any time. I understand that I have the right to inspect all information obtained and/or recorded in the course of the telemedicine visit and may receive copies of available information for a reasonable fee.  I understand that some of the potential risks of receiving the Services via telemedicine include:  Marland Kitchen Delay or interruption in medical evaluation due to technological equipment failure or disruption; . Information transmitted may not be sufficient (e.g. poor resolution of images) to allow for appropriate medical decision making by the Practitioner;  and/or  . In rare instances, security protocols could fail, causing a breach of personal health information.  Furthermore, I acknowledge that it is my responsibility to provide information about my medical history, conditions and care that is complete and accurate to the best of my ability. I acknowledge that Practitioner's advice, recommendations, and/or decision may be based on factors not within their control, such as incomplete or inaccurate data provided by me or distortions of diagnostic images or specimens that may result from electronic transmissions. I understand that the practice of medicine is not an exact science and that Practitioner makes no warranties or guarantees regarding treatment outcomes. I acknowledge that a copy of this consent can be made available to me via my patient portal ([place]), or I can request a printed copy by calling the office of [facility].    I understand that my insurance will be billed for this visit.   I have read or had this consent read to me. . I understand the contents of this consent, which adequately explains the benefits and risks of the Services being provided via telemedicine.  . I have been provided ample opportunity to ask questions regarding this consent and the Services and have had my questions answered to my satisfaction. . I give my informed consent for the services to be provided through the use of telemedicine in my medical care

## 2020-09-02 NOTE — Progress Notes (Signed)
Electrophysiology TeleHealth Note   Due to national recommendations of social distancing due to COVID 19, an audio/video telehealth visit is felt to be most appropriate for this patient at this time.  See MyChart message from today for the patient's consent to telehealth for [facility].   Date:  09/02/2020   ID:  Kevin Hampton, DOB 09-05-1965, MRN [id number]  Location: patient's home  Provider location: [address] [place]  Evaluation Performed: Follow-up visit  PCP:  Gildardo Pounds, NP  Cardiologist: DB Electrophysiologist:  SK   Chief Complaint:  *CHF  History of Present Illness:    Kevin Hampton is a 55 y.o. male who presents via audio/video conferencing for a telehealth visit today.  Since last being seen in our clinic for consideration of ICD Rx in setting of NICM and then class 1 sx, now with Class 2 symptoms and LBBB with intercurrent pituitary tumor resection 12/21, the patient reports no chest pain, mild SOB.  EF unchanged, but PVCs less.    Was told that it would take 6-12 months for the "resetting of his pituitary," and he would like to wait for this time to see if Heart improves   DATE TEST EF   11/19 Echo   35-40%    7/20 LHC  Normal Coronarys  3/21 Echo   25% %   5/21 cMRI 20-25% Difficult study2/ artifact  3/22 Echo 20-25%         Date Cr K TSH LFTs Hgb  6/21 1.28 4.9 0.84 25 17.5  3/22 1.31 5.4 0.16  18   Date PVCs    10/20 4.8%       Date QRSw  10/20 146  6/21 150  12/21 180      The patient denies symptoms of fevers, chills, cough, or new SOB worrisome for COVID 19.    Past Medical History:  Diagnosis Date  . Acromegaly ([place])   . CHF (congestive heart failure) ([place])   . Essential hypertension   . Hypertensive emergency 12/15/2018  . Nonischemic cardiomyopathy ([place])   . Obesity   . Pituitary macroadenoma ([place])   . Systolic heart failure ([place])   . Type 2 diabetes mellitus with complication [facility])      Past Surgical History:  Procedure Laterality Date  . CRANIOTOMY N/A 05/26/2020   Procedure: ENDOSCOPIC TRANSPHENOIDAL RESECTION OF TUMOR;  Surgeon: Vallarie Mare, MD;  Location: [place];  Service: Neurosurgery;  Laterality: N/A;  . RIGHT/LEFT HEART CATH AND CORONARY ANGIOGRAPHY N/A 12/16/2018   Procedure: RIGHT/LEFT HEART CATH AND CORONARY ANGIOGRAPHY;  Surgeon: Lorretta Harp, MD;  Location: [place] [facility] CV LAB;  Service: Cardiovascular;  Laterality: N/A;  . TRANSPHENOIDAL APPROACH EXPOSURE N/A 05/26/2020   Procedure: TRANSPHENOIDAL APPROACH EXPOSURE;  Surgeon: Jerrell Belfast, MD;  Location: [facility];  Service: ENT;  Laterality: N/A;    Current Outpatient Medications  Medication Sig Dispense Refill  . amiodarone (PACERONE) 200 MG tablet Take 0.5 tablets (100 mg total) by mouth daily. 90 tablet 0  . dapagliflozin propanediol (FARXIGA) 10 MG TABS tablet Take 1 tablet (10 mg total) by mouth daily before breakfast. 90 tablet 1  . ENTRESTO 24-26 MG Take 1 tablet by mouth twice daily 60 tablet 11  . glipiZIDE (GLUCOTROL) 10 MG tablet Take 1 tablet (10 mg total) by mouth 2 (two) times daily before a meal. 180 tablet 1  . metFORMIN (GLUCOPHAGE) 500 MG tablet TAKE 2 TABLETS BY MOUTH TWICE DAILY WITH  A MEAL 120 tablet 1  . spironolactone (ALDACTONE) 25 MG tablet Take 1 tablet by mouth once daily 90 tablet 0   No current facility-administered medications for this visit.    Allergies:   Coreg [carvedilol]   Social History:  The patient  reports that he has quit smoking. He has never used smokeless tobacco. He reports that he does not drink alcohol and does not use drugs.   Family History:  The patient's   family history includes Heart attack in his mother; Hypertension in his brother, father, mother, and sister.   ROS:  Please see the history of present illness.   All other systems are personally reviewed and negative.    Exam:    Vital Signs:  Ht 6\' 4"  (1.93 m)   Wt 245 lb (111.1 kg)    BMI 29.82 kg/m    Well appearing, alert and conversant, regular work of breathing,  good skin color Eyes- anicteric, neuro- grossly intact, skin- no apparent rash or lesions or cyanosis, mouth- oral mucosa is pink   Labs/Other Tests and Data Reviewed:    Recent Labs: 02/18/2020: B Natriuretic Peptide 34.7 04/06/2020: ALT 24 06/24/2020: TSH 0.16 08/18/2020: BUN 19; Creatinine, Ser 1.31; Hemoglobin 18.0; Platelets 224; Potassium 5.4; Sodium 140   Wt Readings from Last 3 Encounters:  09/02/20 245 lb (111.1 kg)  08/23/20 249 lb (112.9 kg)  08/18/20 247 lb 12.8 oz (112.4 kg)     Other studies personally reviewed: Additional studies/ records that were reviewed today include:  Notes above     ASSESSMENT & PLAN:    Cardiomyopathy-nonischemic  PVCs-frequent right bundle right axis  Left bundle branch block  Congestive heart failure-chronic-systolic  Functional status is class 2;  He is reluctant to proceed until he has given his heart the full 6 months which he was told might be assoc with heart improvement following his pituitary surgery.  We reviewed anticipated benefits of sudden death risk reduction based on an absolute ( in NICM) value of aobut 1.4/yr  I told him I am not sanguine that the time will make any difference and with his widening LBBB would stand to benefit significantly.Marland Kitchen He is advised that he could worsen as his QRS widens and he said if things do he will let us know * COVID 19 screen The patient denies symptoms of COVID 19 at this time.  The importance of social distancing was discussed today.  Follow-up:  49m    Current medicines are reviewed at length with the patient today.   The patient does not have concerns regarding his medicines.  The following changes were made today:  none  Labs/ tests ordered today include: echo in 3 months ( with CHF clinic No orders of the defined types were placed in this encounter.    Patient Risk:  after full review of  this patients clinical status, I feel that they are at moderate  risk at this time.  Today, I have spent  13 minutes with the patient with telehealth technology discussing the above.  Signed, Virl Axe, MD  09/02/2020 2:42 PM     [place] [address] [place] [address] [phone] (office) [phone] (fax)

## 2020-09-06 ENCOUNTER — Other Ambulatory Visit: Payer: Self-pay

## 2020-09-06 ENCOUNTER — Ambulatory Visit (HOSPITAL_COMMUNITY)
Admission: RE | Admit: 2020-09-06 | Discharge: 2020-09-06 | Disposition: A | Discharge status: Home / Self Care | Payer: 59 | Source: Ambulatory Visit | Attending: Internal Medicine | Admitting: Internal Medicine

## 2020-09-06 DIAGNOSIS — I5022 Chronic systolic (congestive) heart failure: Secondary | ICD-10-CM | POA: Insufficient documentation

## 2020-09-06 LAB — BASIC METABOLIC PANEL
Anion gap: 7 (ref 5–15)
BUN: 12 mg/dL (ref 6–20)
CO2: 29 mmol/L (ref 22–32)
Calcium: 9.8 mg/dL (ref 8.9–10.3)
Chloride: 104 mmol/L (ref 98–111)
Creatinine, Ser: 1.3 mg/dL — ABNORMAL HIGH (ref 0.61–1.24)
GFR, Estimated: >60 mL/min (ref >60)
Glucose, Bld: 180 mg/dL — ABNORMAL HIGH (ref 70–99)
Potassium: 5 mmol/L (ref 3.5–5.1)
Sodium: 140 mmol/L (ref 135–145)

## 2020-09-14 ENCOUNTER — Other Ambulatory Visit: Payer: Self-pay | Admitting: Nurse Practitioner

## 2020-09-14 DIAGNOSIS — E11630 Type 2 diabetes mellitus with periodontal disease: Secondary | ICD-10-CM

## 2020-09-15 ENCOUNTER — Ambulatory Visit: Payer: Self-pay | Admitting: Pharmacist

## 2020-09-23 ENCOUNTER — Other Ambulatory Visit: Payer: Self-pay

## 2020-09-23 ENCOUNTER — Encounter: Payer: Self-pay | Admitting: Internal Medicine

## 2020-09-23 ENCOUNTER — Ambulatory Visit (INDEPENDENT_AMBULATORY_CARE_PROVIDER_SITE_OTHER): Payer: 59 | Admitting: Internal Medicine

## 2020-09-23 VITALS — BP 130/90 | HR 88 | Ht 76.0 in | Wt 256.0 lb

## 2020-09-23 DIAGNOSIS — E0590 Thyrotoxicosis, unspecified without thyrotoxic crisis or storm: Secondary | ICD-10-CM | POA: Diagnosis not present

## 2020-09-23 DIAGNOSIS — E220 Acromegaly and pituitary gigantism: Secondary | ICD-10-CM

## 2020-09-23 DIAGNOSIS — D352 Benign neoplasm of pituitary gland: Secondary | ICD-10-CM | POA: Diagnosis not present

## 2020-09-23 LAB — T4, FREE: Free T4: 1.89 ng/dL — ABNORMAL HIGH (ref 0.60–1.60)

## 2020-09-23 LAB — T3, FREE: T3, Free: 4.5 pg/mL — ABNORMAL HIGH (ref 2.3–4.2)

## 2020-09-23 LAB — TSH: TSH: 0.47 u[IU]/mL (ref 0.35–4.50)

## 2020-09-23 NOTE — Progress Notes (Signed)
Patient ID: Kevin Hampton, male   DOB: [birth year], 55 y.o.   MRN: [id number]   This visit occurred during the SARS-CoV-2 public health emergency.  Safety protocols were in place, including screening questions prior to the visit, additional usage of staff PPE, and extensive cleaning of exam room while observing appropriate contact time as indicated for disinfecting solutions.   HPI  Kevin Hampton is a 55 y.o.-year-old male, initially referred by his cardiologist, [name], returning for follow-up for acromegaly and thyrotoxicosis.  Last visit 3 months ago.  Interim history: He feels great: joint pain, reflux, constipation resolved. His heat intolerance is stable. His taste and smell are only slightly improved. He tells me he did not get the methimazole that I sent to the pharmacy at last visit. He gained more than 15 pounds since last visit.  Patient with history of acromegaly, suspected in 11/2019 based on elevated growth hormone and pituitary adenoma diagnosed in 12/2019 on MRI.  Reviewed history: Pt describes that in late 57s, he noticed a change in his appearance: his jaw and forehead started to protrude.  He also has large fingers but he was quite obese and lost approximately 100 pounds approximately 10 to 15 years ago so he thought this was the cause why his fingers were larger.  He also had problems with increased spacing between his lower incisors. His son noticed a change in appearance and advised the patient to have his growth hormone checked.  Pt. was seen by [name] in 10/2019 and a growth hormone was checked at that time.  This returned elevated, so they pituitary MRI was ordered, but this was not done before last visit.  She was referred to endocrinology for investigation for acromegaly.  At our first visit, he described: - + increase in ring size - + widening of spaces between teeth - + developing of an underbite - + deepening of the voice - no increase in shoe  size - no headaches - + Increase sweating - no joint pain, only occasionally in fingers - + occasional constipation - + now only rarely snoring, but had OSA before losing weight - repeated sleep study >> normal - + weight loss - + fatigue, low energy - + cold intolerance only at night, occasionally - no vision loss, no double vision, he prev. wore readers  -not since 03/2019  He also has: - cardiomegaly with cardiomyopathy and CHF (EF 20 to 25%, per review of his most recent cardiac MRI report) - diabetes type 2 (dx'ed 2012), uncontrolled  He had transsphenoidal surgery for acromegaly with Dr. Marcello Moores on 05/26/2020. This was originally planned for 04/15/2020, but he had COVID-19 at that time and the surgery was postponed.  He felt well after the surgery, with no complaints. He did not develop DI or SIADH after the surgery (reviewed sodium levels and these were normal). Also, there is no significant congestion. However, he does report loss of taste and smell, initially with his COVID-19 infection, after which he started to recover, but lost these again after the surgery.  He had steroids after the treatment, hydrocortisone, 2 tablets in a.m. and 1 in p.m., stopped 3 weeks after surgery.  Unfortunately, the pathology of his pituitary tumor was very minimalistic (there was no staining for Ki67 and somatostatin receptors and no mention of granulation status): FINAL MICROSCOPIC DIAGNOSIS:   A. PITUITARY GLAND, TUMOR, RESECTION:  - Pituitary adenoma   GROSS DESCRIPTION:  Received fresh are fragments of soft tan-pink tissue  measuring 1.8 x 1.7  x 0.3 cm in aggregate. The specimen is submitted in toto. (GRP 05/26/2020)   I requested stainings for Ki-67, somatostatin receptors, growth, granularity and prolactin, but only the Ki-67 could be run by the pathology department.  This was <1%.  He saw ENT - Dr Wilburn Cornelia (note reviewed from 06/22/2020).  Reviewed pertinent labs: He had high LH,  FSH, ACTH, with normal cortisol and testosterone. He had very high IGF-I and growth hormone. we did not perform a growth hormone suppression test due to the high suspicion for acromegaly: Component     Latest Ref Rng & Units 11/24/2019 (preop)  IGF-I, LC/MS     50 - 317 ng/mL 1,282 (H)  Z-Score (Male)     -2.0 - 2 SD 6.0 (H)  TSH     0.35 - 4.50 uIU/mL 0.84  T4,Free(Direct)     0.60 - 1.60 ng/dL 2.00 (H)  Triiodothyronine,Free,Serum     2.3 - 4.2 pg/mL 4.3 (H)  Growth Hormone     < OR = 7.1 ng/mL 48.9 (H)  LH     1.50 - 9.30 mIU/mL 13.07 (H)  FSH     1.4 - 18.1 mIU/ML 53.9 (H)  Prolactin     2.0 - 18.0 ng/mL 14.3  Cortisol, Plasma     ug/dL 13.6  C206 ACTH     6 - 50 pg/mL 60 (H)  Testosterone, total     264.0 - 916.0 ng/dL 341.9  10/28/2019: Growth hormone 27.9 (0-10)  Component     Latest Ref Rng & Units 06/24/2020 (postop)          Sodium     135 - 145 mEq/L 138  Potassium     3.5 - 5.1 mEq/L 5.3 (H)  Chloride     96 - 112 mEq/L 100  CO2     19 - 32 mEq/L 30  Glucose     70 - 99 mg/dL 219 (H)  BUN     6 - 23 mg/dL 18  Creatinine     0.40 - 1.50 mg/dL 1.17  GFR     >60.00 mL/min 69.86  Calcium     8.4 - 10.5 mg/dL 10.6 (H)  IGF-I, LC/MS     50 - 317 ng/mL 1,094 (H)  Z-Score (Male)     -2.0 - 2 SD 5.5 (H)  Extra tube recieved        Specimen type recieved      Serum;  TSH     0.35 - 4.50 uIU/mL 0.16 (L)  T4,Free(Direct)     0.60 - 1.60 ng/dL 2.26 (H)  Triiodothyronine,Free,Serum     2.3 - 4.2 pg/mL 4.7 (H)  Growth Hormone     < OR = 7.1 ng/mL 7.0  Cortisol, Plasma     ug/dL 11.3  C206 ACTH     6 - 50 pg/mL 50   Pituitary MRI (01/09/2020) showed a macroadenoma: 2.5 cm macroadenoma with suprasellar extension and abutment of the inferior optic chiasm. Mild splaying of the pre-chiasmatic optic nerves.  Sella floor defect with extension of tumor into the right sphenoid sinus.  Bilateral cavernous sinus involvement without significant  encasement or luminal narrowing of the intracavernous ICAs.  Nonspecific supratentorial white matter T2 hyperintensities. Differential includes minimal chronic microvascular ischemic changes versus post infectious/inflammatory sequela.  Osseous prominence of the bilateral supraorbital margin, anterior maxillary walls and mandibular condyles likely reflects sequela of acromegaly.      Pituitary MRI (08/03/2020): 1. Postsurgical  changes from transsphenoidal pituitary tumor resection with significant interval decrease in size of the pituitary tumor. A hypoenhancing component on the left side of the sella measuring approximately 11 x 10 x 5 mm may represent residual tumor. 2. There is a nonenhancing cystic like component on the right side of the sella measuring approximately 1.5 x 0.8 x 0.8 cm with low signal on T2, likely related to blood products. 3. Persistent masslike structure along the sphenoid septum suggestive of inferior extension of pituitary tumor.  Thyrotoxicosis: He had a low TSH at last visit along with high free thyroid hormones: Lab Results  Component Value Date   TSH 0.16 (L) 06/24/2020   TSH 0.84 11/24/2019   TSH 0.955 10/28/2019   TSH 0.845 05/01/2019   TSH 0.789 12/15/2018   FREET4 2.26 (H) 06/24/2020   FREET4 2.00 (H) 11/24/2019   FREET4 2.26 (H) 10/28/2019   FREET4 1.60 (H) 05/01/2019   Lab Results  Component Value Date   T3FREE 4.7 (H) 06/24/2020   T3FREE 4.3 (H) 11/24/2019   T3FREE 7.1 (H) 10/28/2019   T3FREE 3.2 05/01/2019  He is on amiodarone.  We started methimazole 5 mg daily and advised him to come back for labs in 1 month.  He did not return...  Hypercalcemia: He had occasional slightly high calcium levels, now normalized: Lab Results  Component Value Date   CALCIUM 9.8 09/06/2020   CALCIUM 10.2 08/18/2020   CALCIUM 10.6 (H) 06/24/2020   CALCIUM 9.0 05/27/2020   CALCIUM 10.2 05/20/2020   CALCIUM 9.9 04/06/2020   CALCIUM 10.2  02/18/2020   CALCIUM 10.2 11/18/2019   CALCIUM 10.5 (H) 10/28/2019   CALCIUM 10.1 09/03/2019   CALCIUM 9.8 05/01/2019   CALCIUM 9.4 04/03/2019   CALCIUM 9.7 02/10/2019   CALCIUM 10.3 (H) 02/03/2019   CALCIUM 9.7 12/27/2018   CALCIUM 9.0 12/17/2018   CALCIUM 9.2 12/16/2018   CALCIUM 9.3 12/15/2018    Lab Results  Component Value Date   MG 1.9 05/01/2019   MG 1.7 12/27/2018   MG 1.7 12/16/2018   MG 1.4 (L) 12/15/2018   No results found for: VD25OH   No previous colonoscopies.  He has a history of nasal polyp surgeries and mentions that he has a lot of scar tissue in his nose.  He lost a significant amount of weight after he moved from [employer] to [place]. He attributed this to his diabetes. This was not unintentional.  No FH of DM, pituitary tumors.   He is a Theme park manager.  He also has uncontrolled diabetes, diagnosed in 2013, which is managed by his PCP.  This has recently improved significantly.  Reviewed his HbA1c levels: Lab Results  Component Value Date   HGBA1C 8.1 (A) 08/18/2020   HGBA1C 9.0 (H) 05/20/2020   HGBA1C 10.4 (A) 03/01/2020   HGBA1C 11.8 (A) 12/05/2019   HGBA1C 8.7 (A) 09/10/2019   HGBA1C 10.0 (A) 03/17/2019   HGBA1C 12.7 (H) 12/15/2018   He is on: - Metformin 1000 mg 2x a day - Glipizide 10 mg 2x a day - Farxiga 10 mg before breakfast She refused injectable medications.  No FH of DM2.  ROS: Constitutional: no weight gain/no weight loss, no fatigue, + subjective hyperthermia, no subjective hypothermia Eyes: no blurry vision, no xerophthalmia ENT: no sore throat, no nodules palpated in neck, no dysphagia, no odynophagia, no hoarseness Cardiovascular: no CP/no SOB/no palpitations/no leg swelling Respiratory: no cough/no SOB/no wheezing Gastrointestinal: no N/no V/no D/no C/no acid reflux Musculoskeletal: no muscle aches/no  joint aches Skin: no rashes, no hair loss Neurological: no tremors/no numbness/no tingling/no dizziness  I reviewed  pt's medications, allergies, PMH, social hx, family hx, and changes were documented in the history of present illness. Otherwise, unchanged from my initial visit note.  Past Medical History:  Diagnosis Date  . Acromegaly ([place])   . CHF (congestive heart failure) ([place])   . Essential hypertension   . Hypertensive emergency 12/15/2018  . Nonischemic cardiomyopathy ([place])   . Obesity   . Pituitary macroadenoma ([place])   . Systolic heart failure ([place])   . Type 2 diabetes mellitus with complication [facility])    Past Surgical History:  Procedure Laterality Date  . CRANIOTOMY N/A 05/26/2020   Procedure: ENDOSCOPIC TRANSPHENOIDAL RESECTION OF TUMOR;  Surgeon: Vallarie Mare, MD;  Location: [place];  Service: Neurosurgery;  Laterality: N/A;  . RIGHT/LEFT HEART CATH AND CORONARY ANGIOGRAPHY N/A 12/16/2018   Procedure: RIGHT/LEFT HEART CATH AND CORONARY ANGIOGRAPHY;  Surgeon: Lorretta Harp, MD;  Location: [place] CV LAB;  Service: Cardiovascular;  Laterality: N/A;  . TRANSPHENOIDAL APPROACH EXPOSURE N/A 05/26/2020   Procedure: TRANSPHENOIDAL APPROACH EXPOSURE;  Surgeon: Jerrell Belfast, MD;  Location: [place];  Service: ENT;  Laterality: N/A;   Social History   Socioeconomic History  . Marital status: Married    Spouse name: Everlene Farrier  . Number of children: 1  . Years of education: Not on file  . Highest education level: Not on file  Occupational History  . Occupation: Minister/carpenter/musician  Tobacco Use  . Smoking status: Former Research scientist (life sciences)  . Smokeless tobacco: Never Used  Vaping Use  . Vaping Use: Never used  Substance and Sexual Activity  . Alcohol use: Never  . Drug use: Never  . Sexual activity: Yes  Other Topics Concern  . Not on file  Social History Narrative  . Not on file   Social Determinants of Health   Financial Resource Strain: Not on file  Food Insecurity: Not on file  Transportation Needs: Not on file  Physical Activity: Not on file  Stress: Not on file  Social  Connections: Not on file  Intimate Partner Violence: Not on file   Current Outpatient Medications on File Prior to Visit  Medication Sig Dispense Refill  . amiodarone (PACERONE) 200 MG tablet Take 0.5 tablets (100 mg total) by mouth daily. 90 tablet 0  . dapagliflozin propanediol (FARXIGA) 10 MG TABS tablet Take 1 tablet (10 mg total) by mouth daily before breakfast. 90 tablet 1  . ENTRESTO 24-26 MG Take 1 tablet by mouth twice daily 60 tablet 11  . glipiZIDE (GLUCOTROL) 10 MG tablet Take 1 tablet (10 mg total) by mouth 2 (two) times daily before a meal. 180 tablet 1  . metFORMIN (GLUCOPHAGE) 500 MG tablet TAKE 2 TABLETS BY MOUTH TWICE DAILY WITH A MEAL 120 tablet 0  . spironolactone (ALDACTONE) 25 MG tablet Take 1 tablet by mouth once daily 90 tablet 0   No current facility-administered medications on file prior to visit.   Allergies  Allergen Reactions  . Coreg [Carvedilol] Other (See Comments)    Brings heart rate and blood pressure down severly   Family History  Problem Relation Age of Onset  . Hypertension Mother   . Heart attack Mother   . Hypertension Father   . Hypertension Sister   . Hypertension Brother     PE: BP 130/90 (BP Location: Right Arm, Patient Position: Sitting, Cuff Size: Normal)   Pulse 88   Ht '6\' 4"'  (  1.93 m)   Wt 256 lb (116.1 kg)   SpO2 97%   BMI 31.16 kg/m  Wt Readings from Last 3 Encounters:  09/23/20 256 lb (116.1 kg)  09/02/20 245 lb (111.1 kg)  08/23/20 249 lb (112.9 kg)   Constitutional: overweight, in NAD, + deep voice, + frontal bossing, + enlarged jaw with widely spaced incisors, + enlarged lower lip, + prominent nasolabial folds Eyes: PERRLA, EOMI, no exophthalmos ENT: moist mucous membranes, no thyromegaly, no cervical lymphadenopathy Cardiovascular: RRR, No MRG Respiratory: CTA B Gastrointestinal: abdomen soft, NT, ND, BS+ Musculoskeletal: + deformities (large jaw, fingers, forehead), strength intact in all 4 Skin: moist, warm, no  rashes, + skin tags on neck Neurological: no tremor with outstretched hands, DTR normal in all 4  ASSESSMENT: 1.  Acromegaly  2.  Pituitary macroadenoma  3.  Thyrotoxicosis  4.  Hypercalcemia  PLAN:  1.and 2.   -Patient with approximately 20-year history of facial feature changes and traumatic change in appearance over the years, per review of pictures brought by patient.  He was found to have a high growth hormone by his cardiologist, during investigation for cardiomegaly.  At our first visit, we checked an IGF-I along with growth hormone and these were very high.  He also had high LH, FSH, and ACTH.  A pituitary MRI (01/09/2020) showed a macroadenoma measuring 2.5 cm, with suprasellar extension and abutting the inferior optic chiasm.  Also, the tumor eroded the sella floor and was extending in the right sphenoid sinus.  He was referred to neurosurgery and had transsphenoidal surgery on 05/27/2020.  He tolerated the surgery well and did not develop DI or SIADH after the surgery.  Also, no increased congestion or headaches.  We reviewed together the pathology of the tumor: The tumor measures 1.8 cm in the largest dimension and it was an adenoma, however, no other pathology information was offered.  Before this visit, I discussed with Dr. Jethro Bolus, pathologist, and requested staining for growth hormone granularity, prolactin, somatostatin receptors, and Ki-67 to help with further treatment planning.  Only the Ki-67 was able to be obtained and this was low, less than 1%.  At today's visit, I discussed with the patient that this is indicative of a slow-growing tumor, a grade 1 NET, which is excellent. -We reviewed together his postoperative investigation: A growth hormone was not checked postoperatively in the hospital, but ideally this would have been lower than 2 ng/mL, fasting.  We checked this at last visit, and a growth hormone was 7 ng/mL (despite a glucose 219) and IGF-I was still very high at  1094 ng/mL.  However, it is expected that the IGF-I continues to decrease after surgery so we will repeat this today.  I will also recheck his growth hormone (we may need an OGTT if glucose is normal and GH is not suppressed - ideally GH post 75 mg glucose solution is  <1, or, better, <0.4 for admission).  Due to the persistently high GH, It is clear that he has persistent tumor and the impact, this was evident on his most recent pituitary MRI from 08/03/2020 showing left sellar mass measuring 1.1 cm in the largest dimension and also a persistent masslike structure in the sphenoid sinus. -Therefore, at today's visit we discussed about the need for adjuvant treatment, and I explained that there are several options:  Somatostatin analogs - I would suggest either somatostatin LAR or lanreotide, which are injected monthly.  However, these are very expensive and, if  not able to get them covered by his insurance, I may need to refer him to an academic center.  Cabergoline-usually as an addition to the somatostatin analogs, but I feel that we can try this first and see if this can help bring his IGF-I down.  The rationale is that some acromegalic tumors may co-secrete prolactin, so may be responsive to the dopamine agonists  Somatostatin receptor blocker - but for some months, this is usually used after the above.  This is currently a daily injection but weekly injections are not works.  It is also very expensive and usually treatment is long-term.  Repeat surgery - however, this is unlikely to be successful especially with tumors extending outside the sella turcica due to accessibility  Gamma knife surgery - we may need to proceed with this as a last resort, due to cost and possible complications including hypopituitarism. -His ACTH was also slightly high before, but it normalized at last visit, when he also had a normal cortisol.  He is TFTs were abnormal at that time (see below). -His sodium was recently  normal in 08/2020 -I will see him back in 4 months  3.  Thyrotoxicosis -His TSH was slightly suppressed but free T4 and free T3 were high at last visit. -This was most likely due to a combination of amiodarone and recent pituitary surgery -I advised him to start methimazole low-dose >> did not get this... -We will recheck his TFTs now -I would like to avoid the use of prednisone if possible due to his uncontrolled diabetes  4.  Hypercalcemia -He had an occasional slightly high calcium in the past and also around the time of his surgery - this is occasionally seen in the setting of acromegaly either due to high PTH or high calcitriol levels. -He had repeat calcium levels since and these were normal 08/2026.    Component     Latest Ref Rng & Units 09/23/2020  IGF-I, LC/MS     50 - 317 ng/mL 882 (H)  Z-Score (Male)     -2.0 - 2 SD 4.8 (H)  Glucose     65 - 99 mg/dL 146 (H)  TSH     0.35 - 4.50 uIU/mL 0.47  T4,Free(Direct)     0.60 - 1.60 ng/dL 1.89 (H)  Triiodothyronine,Free,Serum     2.3 - 4.2 pg/mL 4.5 (H)  Growth Hormone     < OR = 7.1 ng/mL 5.6  TSI     <140 % baseline <89   His free T4 and free T3 levels are still slightly high, but TSH is now normal.  Graves' antibodies are not elevated.  We can continue to keep an eye on the thyroid tests without intervention. His growth hormone has decreased.  His IGF-I has decreased but it is still elevated.  However, I would like to repeat the level in another 2 months to see whether it plateaus or continues to decrease and then will decide about starting Cabergoline or not.  Philemon Kingdom, MD PhD [facility] Endocrinology

## 2020-09-23 NOTE — Patient Instructions (Signed)
Please stop at the lab.  Please come back for a follow-up appointment in 4 months.   

## 2020-09-30 LAB — GROWTH HORMONE: Growth Hormone: 5.6 ng/mL (ref <=7.1)

## 2020-09-30 LAB — THYROID STIMULATING IMMUNOGLOBULIN: TSI: <89 % baseline (ref <140)

## 2020-09-30 LAB — GLUCOSE, FASTING: Glucose, Bld: 146 mg/dL — ABNORMAL HIGH (ref 65–99)

## 2020-09-30 LAB — INSULIN-LIKE GROWTH FACTOR
IGF-I, LC/MS: 882 ng/mL — ABNORMAL HIGH (ref 50–317)
Z-Score (Male): 4.8 SD — ABNORMAL HIGH (ref ?–2.0)

## 2020-10-08 ENCOUNTER — Other Ambulatory Visit (HOSPITAL_COMMUNITY): Payer: Self-pay | Admitting: Internal Medicine

## 2020-10-10 ENCOUNTER — Other Ambulatory Visit: Payer: Self-pay | Admitting: Family Medicine

## 2020-10-10 NOTE — Telephone Encounter (Signed)
Requested medication (s) are due for refill today: yes  Requested medication (s) are on the active medication list: yes  Last refill:  07/12/20  Future visit scheduled: yes  Notes to clinic:  no refill protocol   Requested Prescriptions  Pending Prescriptions Disp Refills   spironolactone (ALDACTONE) 25 MG tablet [Pharmacy Med Name: Spironolactone 25 MG Oral Tablet] 90 tablet 0    Sig: Take 1 tablet by mouth once daily      There is no refill protocol information for this order

## 2020-10-13 ENCOUNTER — Other Ambulatory Visit: Payer: Self-pay | Admitting: Nurse Practitioner

## 2020-10-13 DIAGNOSIS — E11630 Type 2 diabetes mellitus with periodontal disease: Secondary | ICD-10-CM

## 2020-10-19 ENCOUNTER — Ambulatory Visit: Payer: Self-pay | Admitting: Nurse Practitioner

## 2020-11-18 ENCOUNTER — Other Ambulatory Visit: Payer: Self-pay

## 2020-11-19 ENCOUNTER — Other Ambulatory Visit: Payer: Self-pay

## 2020-11-19 ENCOUNTER — Encounter: Payer: Self-pay | Admitting: Nurse Practitioner

## 2020-11-19 ENCOUNTER — Ambulatory Visit: Payer: 59 | Attending: Nurse Practitioner | Admitting: Nurse Practitioner

## 2020-11-19 VITALS — BP 128/96 | HR 84 | Ht 76.0 in | Wt 258.4 lb

## 2020-11-19 DIAGNOSIS — E11630 Type 2 diabetes mellitus with periodontal disease: Secondary | ICD-10-CM

## 2020-11-19 DIAGNOSIS — E785 Hyperlipidemia, unspecified: Secondary | ICD-10-CM

## 2020-11-19 DIAGNOSIS — E1165 Type 2 diabetes mellitus with hyperglycemia: Secondary | ICD-10-CM

## 2020-11-19 LAB — POCT GLYCOSYLATED HEMOGLOBIN (HGB A1C): HbA1c, POC (controlled diabetic range): 7.1 % — AB (ref 0.0–7.0)

## 2020-11-19 LAB — GLUCOSE, POCT (MANUAL RESULT ENTRY): POC Glucose: 149 mg/dl — AB (ref 70–99)

## 2020-11-19 MED ORDER — METFORMIN HCL 500 MG PO TABS: 500.0000 mg | ORAL_TABLET | Freq: Two times a day (BID) | ORAL | 0 refills | Status: DC

## 2020-11-19 MED ORDER — GLIPIZIDE 10 MG PO TABS: 10.0000 mg | ORAL_TABLET | Freq: Two times a day (BID) | ORAL | 1 refills | Status: DC

## 2020-11-19 MED ORDER — SPIRONOLACTONE 25 MG PO TABS: 25.0000 mg | ORAL_TABLET | Freq: Every day | ORAL | 1 refills | Status: DC

## 2020-11-19 NOTE — Progress Notes (Signed)
Assessment & Plan:  Kevin Hampton was seen today for diabetes and hypertension.  Diagnoses and all orders for this visit:  Type 2 diabetes mellitus with hyperglycemia, without long-term current use of insulin (HCC) -     POCT glucose (manual entry) -     POCT glycosylated hemoglobin (Hb A1C) -     Ambulatory referral to Ophthalmology -     CMP14+EGFR -     metFORMIN (GLUCOPHAGE) 500 MG tablet; Take 1 tablet (500 mg total) by mouth 2 (two) times daily with a meal. Continue blood sugar control as discussed in office today, low carbohydrate diet, and regular physical exercise as tolerated, 150 minutes per week (30 min each day, 5 days per week, or 50 min 3 days per week). Keep blood sugar logs with fasting goal of 90-130 mg/dl, post prandial (after you eat) less than 180.  For Hypoglycemia: BS <60 and Hyperglycemia BS >400; contact the clinic ASAP. Annual eye exams and foot exams are recommended.   Dyslipidemia, goal LDL below 70 -     Lipid panel INSTRUCTIONS: Work on a low fat, heart healthy diet and participate in regular aerobic exercise program by working out at least 150 minutes per week; 5 days a week-30 minutes per day. Avoid red meat/beef/steak,  fried foods. junk foods, sodas, sugary drinks, unhealthy snacking, alcohol and smoking.  Drink at least 80 oz of water per day and monitor your carbohydrate intake daily.    Patient has been counseled on age-appropriate routine health concerns for screening and prevention. These are reviewed and up-to-date. Referrals have been placed accordingly. Immunizations are up-to-date or declined.    Subjective:   Chief Complaint  Patient presents with   Diabetes   Hypertension   HPI Kevin Hampton 55 y.o. male presents to office today for follow up.   has a past medical history of Acromegaly ([place]), CHF (congestive heart failure) ([place]), Essential hypertension, Hypertensive emergency (12/15/2018), Nonischemic cardiomyopathy ([place]), Obesity, Pituitary  macroadenoma ([place]), Systolic heart failure ([place]), and Type 2 diabetes mellitus with complication ([place]).    The ASCVD Risk score Kevin Hampton DC Jr., et al., 2013) failed to calculate for the following reasons:   The patient has a prior MI or stroke diagnosis  He declines statin. States his goal is to come off of as many medications as he can.   Seeing Cardiology for NICM class 2 symptoms and LBBB. EF 20-25%  DM 2 Improved. Taking metformin 1000 mg BID as prescribed and glipizide 10 mg BID. He would like to decrease metformin to 500 mg BID. Denies any symptoms of hypo or hyperglycemia. LDL not at goal.  Lab Results  Component Value Date   HGBA1C 7.1 (A) 11/19/2020    Lab Results  Component Value Date   HGBA1C 8.1 (A) 08/18/2020  DYSLIPIDEMIA Lab Results  Component Value Date   LDLCALC 92 04/06/2020     Review of Systems  Constitutional:  Negative for fever, malaise/fatigue and weight loss.  HENT: Negative.  Negative for nosebleeds.   Eyes: Negative.  Negative for blurred vision, double vision and photophobia.  Respiratory: Negative.  Negative for cough and shortness of breath.   Cardiovascular: Negative.  Negative for chest pain, palpitations and leg swelling.  Gastrointestinal: Negative.  Negative for heartburn, nausea and vomiting.  Musculoskeletal: Negative.  Negative for myalgias.  Neurological: Negative.  Negative for dizziness, focal weakness, seizures and headaches.  Psychiatric/Behavioral: Negative.  Negative for suicidal ideas.    Past Medical History:  Diagnosis Date  Acromegaly ([place])    CHF (congestive heart failure) ([place])    Essential hypertension    Hypertensive emergency 12/15/2018   Nonischemic cardiomyopathy ([place])    Obesity    Pituitary macroadenoma (HCC)    Systolic heart failure ([place])    Type 2 diabetes mellitus with complication [facility])     Past Surgical History:  Procedure Laterality Date   CRANIOTOMY N/A 05/26/2020   Procedure: ENDOSCOPIC TRANSPHENOIDAL  RESECTION OF TUMOR;  Surgeon: Vallarie Mare, MD;  Location: [place];  Service: Neurosurgery;  Laterality: N/A;   RIGHT/LEFT HEART CATH AND CORONARY ANGIOGRAPHY N/A 12/16/2018   Procedure: RIGHT/LEFT HEART CATH AND CORONARY ANGIOGRAPHY;  Surgeon: Lorretta Harp, MD;  Location: [place] CV LAB;  Service: Cardiovascular;  Laterality: N/A;   TRANSPHENOIDAL APPROACH EXPOSURE N/A 05/26/2020   Procedure: TRANSPHENOIDAL APPROACH EXPOSURE;  Surgeon: Jerrell Belfast, MD;  Location: [place];  Service: ENT;  Laterality: N/A;    Family History  Problem Relation Age of Onset   Hypertension Mother    Heart attack Mother    Hypertension Father    Hypertension Sister    Hypertension Brother     Social History Reviewed with no changes to be made today.   Outpatient Medications Prior to Visit  Medication Sig Dispense Refill   amiodarone (PACERONE) 200 MG tablet Take 1 tablet by mouth once daily 90 tablet 3   ENTRESTO 24-26 MG Take 1 tablet by mouth twice daily 60 tablet 11   spironolactone (ALDACTONE) 25 MG tablet Take 1 tablet by mouth once daily 90 tablet 0   metFORMIN (GLUCOPHAGE) 500 MG tablet TAKE 2 TABLETS BY MOUTH TWICE DAILY WITH A MEAL 120 tablet 0   glipiZIDE (GLUCOTROL) 10 MG tablet Take 1 tablet (10 mg total) by mouth 2 (two) times daily before a meal. 180 tablet 1   No facility-administered medications prior to visit.    Allergies  Allergen Reactions   Coreg [Carvedilol] Other (See Comments)    Brings heart rate and blood pressure down severly       Objective:    BP (!) 128/96   Pulse 84   Ht '6\' 4"'  (1.93 m)   Wt 258 lb 6.4 oz (117.2 kg)   SpO2 99%   BMI 31.45 kg/m  Wt Readings from Last 3 Encounters:  11/19/20 258 lb 6.4 oz (117.2 kg)  09/23/20 256 lb (116.1 kg)  09/02/20 245 lb (111.1 kg)    Physical Exam Vitals and nursing note reviewed.  Constitutional:      Appearance: He is well-developed.  HENT:     Head: Normocephalic and atraumatic.  Cardiovascular:      Rate and Rhythm: Normal rate and regular rhythm.     Heart sounds: Normal heart sounds. No murmur heard.   No friction rub. No gallop.  Pulmonary:     Effort: Pulmonary effort is normal. No tachypnea or respiratory distress.     Breath sounds: Normal breath sounds. No decreased breath sounds, wheezing, rhonchi or rales.  Chest:     Chest wall: No tenderness.  Abdominal:     General: Bowel sounds are normal.     Palpations: Abdomen is soft.  Musculoskeletal:        General: Normal range of motion.     Cervical back: Normal range of motion.  Skin:    General: Skin is warm and dry.  Neurological:     Mental Status: He is alert and oriented to person, place, and time.  Coordination: Coordination normal.  Psychiatric:        Behavior: Behavior normal. Behavior is cooperative.        Thought Content: Thought content normal.        Judgment: Judgment normal.         Patient has been counseled extensively about nutrition and exercise as well as the importance of adherence with medications and regular follow-up. The patient was given clear instructions to go to ER or return to medical center if symptoms don't improve, worsen or new problems develop. The patient verbalized understanding.   Follow-up: Return for 4-6 weeks with [facility]. See me in 3 months.   Gildardo Pounds, FNP-BC [facility] and [facility] [place], [place]   11/19/2020, 10:17 AM

## 2020-11-20 LAB — CMP14+EGFR
ALT: 20 IU/L (ref 0–44)
AST: 17 IU/L (ref 0–40)
Albumin/Globulin Ratio: 1.9 (ref 1.2–2.2)
Albumin: 4.5 g/dL (ref 3.8–4.9)
Alkaline Phosphatase: 89 IU/L (ref 44–121)
BUN/Creatinine Ratio: 8 — ABNORMAL LOW (ref 9–20)
BUN: 10 mg/dL (ref 6–24)
Bilirubin Total: 0.5 mg/dL (ref 0.0–1.2)
CO2: 24 mmol/L (ref 20–29)
Calcium: 10.2 mg/dL (ref 8.7–10.2)
Chloride: 102 mmol/L (ref 96–106)
Creatinine, Ser: 1.23 mg/dL (ref 0.76–1.27)
Globulin, Total: 2.4 g/dL (ref 1.5–4.5)
Glucose: 139 mg/dL — ABNORMAL HIGH (ref 65–99)
Potassium: 5.1 mmol/L (ref 3.5–5.2)
Sodium: 142 mmol/L (ref 134–144)
Total Protein: 6.9 g/dL (ref 6.0–8.5)
eGFR: 69 mL/min/{1.73_m2} (ref >59)

## 2020-11-20 LAB — LIPID PANEL
Chol/HDL Ratio: 3.4 ratio (ref 0.0–5.0)
Cholesterol, Total: 195 mg/dL (ref 100–199)
HDL: 58 mg/dL (ref >39)
LDL Chol Calc (NIH): 115 mg/dL — ABNORMAL HIGH (ref 0–99)
Triglycerides: 127 mg/dL (ref 0–149)
VLDL Cholesterol Cal: 22 mg/dL (ref 5–40)

## 2020-12-24 ENCOUNTER — Ambulatory Visit: Payer: 59 | Admitting: Pharmacist

## 2021-01-04 ENCOUNTER — Other Ambulatory Visit: Payer: Self-pay | Admitting: Nurse Practitioner

## 2021-01-04 DIAGNOSIS — E11630 Type 2 diabetes mellitus with periodontal disease: Secondary | ICD-10-CM

## 2021-02-03 ENCOUNTER — Ambulatory Visit: Payer: 59 | Admitting: Internal Medicine

## 2021-02-21 ENCOUNTER — Other Ambulatory Visit (HOSPITAL_COMMUNITY): Payer: Self-pay | Admitting: Internal Medicine

## 2021-02-21 ENCOUNTER — Ambulatory Visit: Payer: 59 | Attending: Nurse Practitioner | Admitting: Nurse Practitioner

## 2021-02-21 ENCOUNTER — Encounter: Payer: Self-pay | Admitting: Nurse Practitioner

## 2021-02-21 ENCOUNTER — Other Ambulatory Visit: Payer: Self-pay

## 2021-02-21 ENCOUNTER — Other Ambulatory Visit: Payer: Self-pay | Admitting: Nurse Practitioner

## 2021-02-21 VITALS — BP 153/95 | HR 75 | Ht 76.0 in | Wt 266.1 lb

## 2021-02-21 DIAGNOSIS — E1165 Type 2 diabetes mellitus with hyperglycemia: Secondary | ICD-10-CM | POA: Diagnosis not present

## 2021-02-21 DIAGNOSIS — E785 Hyperlipidemia, unspecified: Secondary | ICD-10-CM | POA: Diagnosis not present

## 2021-02-21 DIAGNOSIS — I10 Essential (primary) hypertension: Secondary | ICD-10-CM

## 2021-02-21 LAB — POCT GLYCOSYLATED HEMOGLOBIN (HGB A1C): Hemoglobin A1C: 7.1 % — AB (ref 4.0–5.6)

## 2021-02-21 LAB — GLUCOSE, POCT (MANUAL RESULT ENTRY): POC Glucose: 286 mg/dl — AB (ref 70–99)

## 2021-02-21 MED ORDER — GLIPIZIDE 10 MG PO TABS: 10.0000 mg | ORAL_TABLET | Freq: Two times a day (BID) | ORAL | 1 refills | Status: DC

## 2021-02-21 MED ORDER — SPIRONOLACTONE 25 MG PO TABS: 37.5000 mg | ORAL_TABLET | Freq: Every day | ORAL | 2 refills | Status: DC

## 2021-02-21 NOTE — Progress Notes (Signed)
Assessment & Plan:  Kevin Hampton was seen today for diabetes.  Diagnoses and all orders for this visit:  Type 2 diabetes mellitus with hyperglycemia, without long-term current use of insulin (HCC) -     POCT glucose (manual entry) -     POCT glycosylated hemoglobin (Hb A1C) -     glipiZIDE (GLUCOTROL) 10 MG tablet; Take 1 tablet (10 mg total) by mouth 2 (two) times daily before a meal. -     CMP14+EGFR  Dyslipidemia, goal LDL below 70 INSTRUCTIONS: Work on a low fat, heart healthy diet and participate in regular aerobic exercise program by working out at least 150 minutes per week; 5 days a week-30 minutes per day. Avoid red meat/beef/steak,  fried foods. junk foods, sodas, sugary drinks, unhealthy snacking, alcohol and smoking.  Drink at least 80 oz of water per day and monitor your carbohydrate intake daily.    Essential hypertension Continue all antihypertensives as prescribed.  Remember to bring in your blood pressure log with you for your follow up appointment.  DASH/Mediterranean Diets are healthier choices for HTN.     Patient has been counseled on age-appropriate routine health concerns for screening and prevention. These are reviewed and up-to-date. Referrals have been placed accordingly. Immunizations are up-to-date or declined.    Subjective:   Chief Complaint  Patient presents with   Diabetes   HPI Kevin Hampton 55 y.o. male presents to office today for follow up to DM. He has a past medical history of Acromegaly, CHF, Essential hypertension, Hypertensive emergency (12/15/2018), Nonischemic cardiomyopathy, NSTEMI,  Obesity, Pituitary macroadenoma, Systolic heart failure, and Type 2 diabetes mellitus with complication.   DM He stopped taking metformin due to potential side effects. States he is afraid to take it due to medical issues it has caused some of his friends who took it. He declines starting any other NEW  diabetic medications. A1c not at goal. He reports average  fasting readings 90-120s. He does not routinely monitor postprandial.  He is currently taking farxiga 10 mg daily and glipizide 10 mg BID. Declines statin as well today.  Lab Results  Component Value Date   HGBA1C 7.1 (A) 02/21/2021    Lab Results  Component Value Date   HGBA1C 7.1 (A) 11/19/2020   Lab Results  Component Value Date   LDLCALC 115 (H) 11/19/2020     HTN Not at goal. He states he is taking amiodarone 0.5tab or 100 mg daily. I do not see any dosage changes from Dr. Sung Amabile that instruct him to take half a tablet. He states he will go home and look at his pill bottle for clarification and reach out to me via mychart.  He could not tolerate Entresto at a higher dose so is currently on 24-26 mg twice daily and spironolactone 25 mg daily.  Reports readings at home 140-150/70-80s. Denies chest pain, shortness of breath, palpitations, lightheadedness, dizziness, headaches or BLE edema.   BP Readings from Last 3 Encounters:  02/21/21 (!) 153/95  11/19/20 (!) 128/96  09/23/20 130/90      Review of Systems  Constitutional:  Negative for fever, malaise/fatigue and weight loss.  HENT: Negative.  Negative for nosebleeds.   Eyes: Negative.  Negative for blurred vision, double vision and photophobia.  Respiratory: Negative.  Negative for cough and shortness of breath.   Cardiovascular: Negative.  Negative for chest pain, palpitations and leg swelling.  Gastrointestinal: Negative.  Negative for heartburn, nausea and vomiting.  Musculoskeletal: Negative.  Negative for  myalgias.  Neurological: Negative.  Negative for dizziness, focal weakness, seizures and headaches.  Psychiatric/Behavioral: Negative.  Negative for suicidal ideas.    Past Medical History:  Diagnosis Date   Acromegaly ([place])    CHF (congestive heart failure) ([place])    Essential hypertension    Hypertensive emergency 12/15/2018   Nonischemic cardiomyopathy ([place])    Obesity    Pituitary macroadenoma ([place])     Systolic heart failure ([place])    Type 2 diabetes mellitus with complication [facility])     Past Surgical History:  Procedure Laterality Date   CRANIOTOMY N/A 05/26/2020   Procedure: ENDOSCOPIC TRANSPHENOIDAL RESECTION OF TUMOR;  Surgeon: Vallarie Mare, MD;  Location: [place];  Service: Neurosurgery;  Laterality: N/A;   RIGHT/LEFT HEART CATH AND CORONARY ANGIOGRAPHY N/A 12/16/2018   Procedure: RIGHT/LEFT HEART CATH AND CORONARY ANGIOGRAPHY;  Surgeon: Lorretta Harp, MD;  Location: [place] CV LAB;  Service: Cardiovascular;  Laterality: N/A;   TRANSPHENOIDAL APPROACH EXPOSURE N/A 05/26/2020   Procedure: TRANSPHENOIDAL APPROACH EXPOSURE;  Surgeon: Jerrell Belfast, MD;  Location: [place];  Service: ENT;  Laterality: N/A;    Family History  Problem Relation Age of Onset   Hypertension Mother    Heart attack Mother    Hypertension Father    Hypertension Sister    Hypertension Brother     Social History Reviewed with no changes to be made today.   Outpatient Medications Prior to Visit  Medication Sig Dispense Refill   amiodarone (PACERONE) 200 MG tablet Take 1 tablet by mouth once daily 90 tablet 3   ENTRESTO 24-26 MG Take 1 tablet by mouth twice daily 60 tablet 6   FARXIGA 10 MG TABS tablet TAKE 1 TABLET BY MOUTH ONCE DAILY BEFORE BREAKFAST 90 tablet 0   spironolactone (ALDACTONE) 25 MG tablet Take 1 tablet (25 mg total) by mouth daily. 90 tablet 1   glipiZIDE (GLUCOTROL) 10 MG tablet Take 1 tablet (10 mg total) by mouth 2 (two) times daily before a meal. 180 tablet 1   metFORMIN (GLUCOPHAGE) 500 MG tablet Take 1 tablet (500 mg total) by mouth 2 (two) times daily with a meal. 180 tablet 0   No facility-administered medications prior to visit.    Allergies  Allergen Reactions   Coreg [Carvedilol] Other (See Comments)    Brings heart rate and blood pressure down severly       Objective:    BP (!) 153/95   Pulse 75   Ht '6\' 4"'  (1.93 m)   Wt 266 lb 2 oz (120.7 kg)   SpO2 100%    BMI 32.39 kg/m  Wt Readings from Last 3 Encounters:  02/21/21 266 lb 2 oz (120.7 kg)  11/19/20 258 lb 6.4 oz (117.2 kg)  09/23/20 256 lb (116.1 kg)    Physical Exam Vitals and nursing note reviewed.  Constitutional:      Appearance: He is well-developed.  HENT:     Head: Normocephalic and atraumatic.  Cardiovascular:     Rate and Rhythm: Normal rate and regular rhythm.     Heart sounds: Normal heart sounds. No murmur heard.   No friction rub. No gallop.  Pulmonary:     Effort: Pulmonary effort is normal. No tachypnea or respiratory distress.     Breath sounds: Normal breath sounds. No decreased breath sounds, wheezing, rhonchi or rales.  Chest:     Chest wall: No tenderness.  Abdominal:     General: Bowel sounds are normal.  Palpations: Abdomen is soft.  Musculoskeletal:        General: Normal range of motion.     Cervical back: Normal range of motion.  Skin:    General: Skin is warm and dry.  Neurological:     Mental Status: He is alert and oriented to person, place, and time.     Coordination: Coordination normal.  Psychiatric:        Behavior: Behavior normal. Behavior is cooperative.        Thought Content: Thought content normal.        Judgment: Judgment normal.         Patient has been counseled extensively about nutrition and exercise as well as the importance of adherence with medications and regular follow-up. The patient was given clear instructions to go to ER or return to medical center if symptoms don't improve, worsen or new problems develop. The patient verbalized understanding.   Follow-up: Return in about 3 months (around 05/23/2021).   Gildardo Pounds, FNP-BC [facility] and [place], [place]   02/21/2021, 1:20 PM

## 2021-02-22 LAB — CMP14+EGFR
ALT: 18 IU/L (ref 0–44)
AST: 18 IU/L (ref 0–40)
Albumin/Globulin Ratio: 1.6 (ref 1.2–2.2)
Albumin: 4.1 g/dL (ref 3.8–4.9)
Alkaline Phosphatase: 87 IU/L (ref 44–121)
BUN/Creatinine Ratio: 6 — ABNORMAL LOW (ref 9–20)
BUN: 8 mg/dL (ref 6–24)
Bilirubin Total: 0.5 mg/dL (ref 0.0–1.2)
CO2: 27 mmol/L (ref 20–29)
Calcium: 10.2 mg/dL (ref 8.7–10.2)
Chloride: 102 mmol/L (ref 96–106)
Creatinine, Ser: 1.3 mg/dL — ABNORMAL HIGH (ref 0.76–1.27)
Globulin, Total: 2.6 g/dL (ref 1.5–4.5)
Glucose: 238 mg/dL — ABNORMAL HIGH (ref 65–99)
Potassium: 4.7 mmol/L (ref 3.5–5.2)
Sodium: 143 mmol/L (ref 134–144)
Total Protein: 6.7 g/dL (ref 6.0–8.5)
eGFR: 64 mL/min/{1.73_m2} (ref >59)

## 2021-03-10 ENCOUNTER — Ambulatory Visit: Payer: 59 | Admitting: Pharmacist

## 2021-04-03 ENCOUNTER — Other Ambulatory Visit: Payer: Self-pay | Admitting: Nurse Practitioner

## 2021-04-03 DIAGNOSIS — E11630 Type 2 diabetes mellitus with periodontal disease: Secondary | ICD-10-CM

## 2021-04-03 NOTE — Telephone Encounter (Signed)
Requested Prescriptions  Pending Prescriptions Disp Refills  . FARXIGA 10 MG TABS tablet [Pharmacy Med Name: Farxiga 10 MG Oral Tablet] 90 tablet 0    Sig: TAKE 1 TABLET BY MOUTH ONCE DAILY BEFORE BREAKFAST     Endocrinology:  Diabetes - SGLT2 Inhibitors Failed - 04/03/2021  9:42 AM      Failed - Cr in normal range and within 360 days    Creatinine, Ser  Date Value Ref Range Status  02/21/2021 1.30 (H) 0.76 - 1.27 mg/dL Final         Failed - LDL in normal range and within 360 days    LDL Chol Calc (NIH)  Date Value Ref Range Status  11/19/2020 115 (H) 0 - 99 mg/dL Final         Passed - HBA1C is between 0 and 7.9 and within 180 days    Hemoglobin A1C  Date Value Ref Range Status  02/21/2021 7.1 (A) 4.0 - 5.6 % Final   HbA1c, POC (controlled diabetic range)  Date Value Ref Range Status  11/19/2020 7.1 (A) 0.0 - 7.0 % Final         Passed - eGFR in normal range and within 360 days    GFR calc Af Amer  Date Value Ref Range Status  04/06/2020 85 >59 mL/min/1.73 Final    Comment:    **In accordance with recommendations from the NKF-ASN Task force,**   [facility] is in the process of updating its eGFR calculation to the   2021 CKD-EPI creatinine equation that estimates kidney function   without a race variable.    GFR, Estimated  Date Value Ref Range Status  09/06/2020 >60 >60 mL/min Final    Comment:    (NOTE) Calculated using the CKD-EPI Creatinine Equation (2021)    GFR  Date Value Ref Range Status  06/24/2020 69.86 >60.00 mL/min Final    Comment:    Calculated using the CKD-EPI Creatinine Equation (2021)   eGFR  Date Value Ref Range Status  02/21/2021 64 >59 mL/min/1.73 Final         Passed - Valid encounter within last 6 months    Recent Outpatient Visits          1 month ago Type 2 diabetes mellitus with hyperglycemia, without long-term current use of insulin ([place])   [place], [place] W, NP   4 months ago Type 2  diabetes mellitus with hyperglycemia, without long-term current use of insulin [facility])   [place], [place] W, NP   7 months ago Type 2 diabetes mellitus with hyperglycemia, without long-term current use of insulin [facility])   [place], [place] W, NP   9 months ago Type 2 diabetes mellitus with periodontal disease, without long-term current use of insulin [facility])   [place], [place] W, NP   12 months ago Type 2 diabetes mellitus with periodontal disease, without long-term current use of insulin [facility])   [place], Vernia Buff, NP

## 2021-06-27 ENCOUNTER — Other Ambulatory Visit: Payer: Self-pay | Admitting: Nurse Practitioner

## 2021-06-27 DIAGNOSIS — E11630 Type 2 diabetes mellitus with periodontal disease: Secondary | ICD-10-CM

## 2021-06-27 NOTE — Telephone Encounter (Signed)
[place]  Requested Prescriptions  Pending Prescriptions Disp Refills   FARXIGA 10 MG TABS tablet [Pharmacy Med Name: Farxiga 10 MG Oral Tablet] 90 tablet 0    Sig: TAKE 1 TABLET BY MOUTH ONCE DAILY BEFORE BREAKFAST     Endocrinology:  Diabetes - SGLT2 Inhibitors Failed - 06/27/2021  8:23 AM      Failed - Cr in normal range and within 360 days    Creatinine, Ser  Date Value Ref Range Status  02/21/2021 1.30 (H) 0.76 - 1.27 mg/dL Final         Failed - LDL in normal range and within 360 days    LDL Chol Calc (NIH)  Date Value Ref Range Status  11/19/2020 115 (H) 0 - 99 mg/dL Final         Passed - HBA1C is between 0 and 7.9 and within 180 days    Hemoglobin A1C  Date Value Ref Range Status  02/21/2021 7.1 (A) 4.0 - 5.6 % Final   HbA1c, POC (controlled diabetic range)  Date Value Ref Range Status  11/19/2020 7.1 (A) 0.0 - 7.0 % Final         Passed - eGFR in normal range and within 360 days    GFR calc Af Amer  Date Value Ref Range Status  04/06/2020 85 >59 mL/min/1.73 Final    Comment:    **In accordance with recommendations from the NKF-ASN Task force,**   [facility] is in the process of updating its eGFR calculation to the   2021 CKD-EPI creatinine equation that estimates kidney function   without a race variable.    GFR, Estimated  Date Value Ref Range Status  09/06/2020 >60 >60 mL/min Final    Comment:    (NOTE) Calculated using the CKD-EPI Creatinine Equation (2021)    GFR  Date Value Ref Range Status  06/24/2020 69.86 >60.00 mL/min Final    Comment:    Calculated using the CKD-EPI Creatinine Equation (2021)   eGFR  Date Value Ref Range Status  02/21/2021 64 >59 mL/min/1.73 Final         Passed - Valid encounter within last 6 months    Recent Outpatient Visits          4 months ago Type 2 diabetes mellitus with hyperglycemia, without long-term current use of insulin ([place])   [place], [place] W, NP   7 months ago Type 2  diabetes mellitus with hyperglycemia, without long-term current use of insulin [facility])   [place], [place] W, NP   10 months ago Type 2 diabetes mellitus with hyperglycemia, without long-term current use of insulin [facility])   [place], [place] W, NP   1 year ago Type 2 diabetes mellitus with periodontal disease, without long-term current use of insulin ([place])   [place], [place] W, NP   1 year ago Type 2 diabetes mellitus with periodontal disease, without long-term current use of insulin [facility])   [place], Vernia Buff, NP

## 2021-07-13 ENCOUNTER — Other Ambulatory Visit: Payer: Self-pay | Admitting: Nurse Practitioner

## 2021-07-13 NOTE — Telephone Encounter (Signed)
Requested Prescriptions  Pending Prescriptions Disp Refills   spironolactone (ALDACTONE) 25 MG tablet [Pharmacy Med Name: Spironolactone 25 MG Oral Tablet] 90 tablet 0    Sig: Take 1 tablet by mouth once daily     Cardiovascular: Diuretics - Aldosterone Antagonist Failed - 07/13/2021 10:04 AM      Failed - Cr in normal range and within 180 days    Creatinine, Ser  Date Value Ref Range Status  02/21/2021 1.30 (H) 0.76 - 1.27 mg/dL Final         Failed - Last BP in normal range    BP Readings from Last 1 Encounters:  02/21/21 (!) 153/95         Passed - K in normal range and within 180 days    Potassium  Date Value Ref Range Status  02/21/2021 4.7 3.5 - 5.2 mmol/L Final         Passed - Na in normal range and within 180 days    Sodium  Date Value Ref Range Status  02/21/2021 143 134 - 144 mmol/L Final         Passed - eGFR is 30 or above and within 180 days    GFR calc Af Amer  Date Value Ref Range Status  04/06/2020 85 >59 mL/min/1.73 Final    Comment:    **In accordance with recommendations from the NKF-ASN Task force,**   [facility] is in the process of updating its eGFR calculation to the   2021 CKD-EPI creatinine equation that estimates kidney function   without a race variable.    GFR, Estimated  Date Value Ref Range Status  09/06/2020 >60 >60 mL/min Final    Comment:    (NOTE) Calculated using the CKD-EPI Creatinine Equation (2021)    GFR  Date Value Ref Range Status  06/24/2020 69.86 >60.00 mL/min Final    Comment:    Calculated using the CKD-EPI Creatinine Equation (2021)   eGFR  Date Value Ref Range Status  02/21/2021 64 >59 mL/min/1.73 Final         Passed - Valid encounter within last 6 months    Recent Outpatient Visits          4 months ago Type 2 diabetes mellitus with hyperglycemia, without long-term current use of insulin ([place])   [place], [place] W, NP   7 months ago Type 2 diabetes mellitus with  hyperglycemia, without long-term current use of insulin [facility])   [place], [place] W, NP   10 months ago Type 2 diabetes mellitus with hyperglycemia, without long-term current use of insulin [facility])   [place], [place] W, NP   1 year ago Type 2 diabetes mellitus with periodontal disease, without long-term current use of insulin ([place])   [place], [place] W, NP   1 year ago Type 2 diabetes mellitus with periodontal disease, without long-term current use of insulin [facility])   [place], Vernia Buff, NP

## 2021-09-20 ENCOUNTER — Other Ambulatory Visit (HOSPITAL_COMMUNITY): Payer: Self-pay | Admitting: Internal Medicine

## 2021-10-11 ENCOUNTER — Ambulatory Visit (HOSPITAL_COMMUNITY)
Admission: RE | Admit: 2021-10-11 | Discharge: 2021-10-11 | Disposition: A | Discharge status: Home / Self Care | Payer: 59 | Source: Ambulatory Visit | Attending: Internal Medicine | Admitting: Internal Medicine

## 2021-10-11 ENCOUNTER — Encounter (HOSPITAL_COMMUNITY): Payer: Self-pay | Admitting: Internal Medicine

## 2021-10-11 ENCOUNTER — Other Ambulatory Visit (HOSPITAL_COMMUNITY): Payer: Self-pay

## 2021-10-11 ENCOUNTER — Other Ambulatory Visit (HOSPITAL_COMMUNITY): Payer: Self-pay | Admitting: Internal Medicine

## 2021-10-11 VITALS — BP 122/78 | HR 61 | Wt 258.6 lb

## 2021-10-11 DIAGNOSIS — I428 Other cardiomyopathies: Secondary | ICD-10-CM | POA: Insufficient documentation

## 2021-10-11 DIAGNOSIS — I10 Essential (primary) hypertension: Secondary | ICD-10-CM | POA: Diagnosis not present

## 2021-10-11 DIAGNOSIS — I447 Left bundle-branch block, unspecified: Secondary | ICD-10-CM

## 2021-10-11 DIAGNOSIS — E1165 Type 2 diabetes mellitus with hyperglycemia: Secondary | ICD-10-CM | POA: Insufficient documentation

## 2021-10-11 DIAGNOSIS — I5022 Chronic systolic (congestive) heart failure: Secondary | ICD-10-CM

## 2021-10-11 DIAGNOSIS — I493 Ventricular premature depolarization: Secondary | ICD-10-CM

## 2021-10-11 DIAGNOSIS — I5023 Acute on chronic systolic (congestive) heart failure: Secondary | ICD-10-CM | POA: Diagnosis present

## 2021-10-11 DIAGNOSIS — E220 Acromegaly and pituitary gigantism: Secondary | ICD-10-CM | POA: Insufficient documentation

## 2021-10-11 DIAGNOSIS — I110 Hypertensive heart disease with heart failure: Secondary | ICD-10-CM | POA: Insufficient documentation

## 2021-10-11 DIAGNOSIS — Z79899 Other long term (current) drug therapy: Secondary | ICD-10-CM | POA: Diagnosis not present

## 2021-10-11 LAB — COMPREHENSIVE METABOLIC PANEL
ALT: 17 U/L (ref 0–44)
AST: 18 U/L (ref 15–41)
Albumin: 3.4 g/dL — ABNORMAL LOW (ref 3.5–5.0)
Alkaline Phosphatase: 103 U/L (ref 38–126)
Anion gap: 7 (ref 5–15)
BUN: 12 mg/dL (ref 6–20)
CO2: 29 mmol/L (ref 22–32)
Calcium: 9.6 mg/dL (ref 8.9–10.3)
Chloride: 100 mmol/L (ref 98–111)
Creatinine, Ser: 1.3 mg/dL — ABNORMAL HIGH (ref 0.61–1.24)
GFR, Estimated: >60 mL/min (ref >60)
Glucose, Bld: 486 mg/dL — ABNORMAL HIGH (ref 70–99)
Potassium: 4.6 mmol/L (ref 3.5–5.1)
Sodium: 136 mmol/L (ref 135–145)
Total Bilirubin: 0.8 mg/dL (ref 0.3–1.2)
Total Protein: 6.4 g/dL — ABNORMAL LOW (ref 6.5–8.1)

## 2021-10-11 LAB — T4, FREE: Free T4: 1.92 ng/dL — ABNORMAL HIGH (ref 0.61–1.12)

## 2021-10-11 LAB — TSH: TSH: 0.414 u[IU]/mL (ref 0.350–4.500)

## 2021-10-11 LAB — BRAIN NATRIURETIC PEPTIDE: B Natriuretic Peptide: 37.7 pg/mL (ref 0.0–100.0)

## 2021-10-11 MED ORDER — DAPAGLIFLOZIN PROPANEDIOL 10 MG PO TABS: 10.0000 mg | ORAL_TABLET | Freq: Every day | ORAL | 6 refills | Status: DC

## 2021-10-11 NOTE — Patient Instructions (Signed)
Medication Changes: ? ?START Farxiga '10mg'$  oral, daily. ? ?Lab Work: ? ?Labs done today, your results will be available in MyChart, we will contact you for abnormal readings. ? ? ?Testing/Procedures: ? ?Your physician has requested that you have an echocardiogram. Echocardiography is a painless test that uses sound waves to create images of your heart. It provides your doctor with information about the size and shape of your heart and how well your heart?s chambers and valves are working. This procedure takes approximately one hour. There are no restrictions for this procedure. ? ?Your provider has recommended that  you wear a Zio Patch for 14 days.  This monitor will record your heart rhythm for our review.  IF you have any symptoms while wearing the monitor please press the button.  If you have any issues with the patch or you notice a red or orange light on it please call the company at [phone].  Once you remove the patch please mail it back to the company as soon as possible so we can get the results.  ? ? ?Referrals: ? ?none ? ?Special Instructions // Education: ? ?Do the following things EVERYDAY: ?Weigh yourself in the morning before breakfast. Write it down and keep it in a log. ?Take your medicines as prescribed ?Eat low salt foods--Limit salt (sodium) to 2000 mg per day.  ?Stay as active as you can everyday ?Limit all fluids for the day to less than 2 liters ? ? ? ?Follow-Up in: 4 months ? ?At the [facility], you and your health needs are our priority. We have a designated team specialized in the treatment of Heart Failure. This Care Team includes your primary Heart Failure Specialized Cardiologist (physician), Advanced Practice Providers (APPs- Physician Assistants and Nurse Practitioners), and Pharmacist who all work together to provide you with the care you need, when you need it.  ? ?You may see any of the following providers on your designated Care Team at your next follow  up: ? ?Dr Glori Bickers ?Dr Loralie Champagne ?Darrick Grinder, NP ?Lyda Jester, PA ?Jessica Milford,NP ?Marlyce Huge, PA ?Audry Riles, PharmD ? ? ?Please be sure to bring in all your medications bottles to every appointment.  ? ?Need to Contact us: ? ?If you have any questions or concerns before your next appointment please send [place] a message through [place] or call our office at [phone].   ? ?TO LEAVE A MESSAGE FOR THE NURSE SELECT OPTION 2, PLEASE LEAVE A MESSAGE INCLUDING: ?YOUR NAME ?DATE OF BIRTH ?CALL BACK NUMBER ?REASON FOR CALL**this is important as we prioritize the call backs ? ?YOU WILL RECEIVE A CALL BACK THE SAME DAY AS LONG AS YOU CALL BEFORE 4:00 PM ?

## 2021-10-11 NOTE — Progress Notes (Signed)
? ?      ADVANCED HF CLINIC NOTE ? ?HPI: ? ?[name] is a 56 yo male with  HTN, poorly-controlled, DM2, acromegaly and systolic HF due to NICM.  ? ?He was admitted 7/20 with hypertensive urgency and acute systolic HF. ECG showed LBBB of uncertain duration. Echo EF 25-30%.  Cardiac catheterization on 12/16/18 showed normal coronary arteries with well compensated filling pressures after diuresis.  ? ?He was started on GDMT including carvedilol 6.25 bid, Entresto 49/51 bid, spironolactone 12.5 daily. He did well for several days but then developed severe fatigue. I saw him in Clinic on 12/27/18. He was in bigeminy. I stopped his b-blocker and started amio 200 bid.K 5.0 Mg 1.7 ? ?Echo 9/21: EF 30-35%, RV ok. ? ?Remains on amio to suppress PVCs. 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 Referred to Dr. Monna Hampton. Underwent transphenoidal pituitary resection in 12/21 ? ?Here for f/u. Wife passed away from HF on 29-May-2021. Now living with his brother and his wife. Feels ok. Gets SOB with climbing steps. + easy fatigue. No problems with ADLs. No edema, orthopnea or PND. Compliant with meds.  ? ?Echo 08/23/20: EF 20-25% + septal dyssynchrony RV ok.  ?Studies ? ?cMRI 10/21/19 ?1. Very difficult study due to frequent ectopy and respiratory ?artifact. ?2. Moderately dilated left ventricle with estimated EF 20-25% ?(unable to quantify due to respiratory artifact and ectopy). Diffuse ?hypokinesis with basal to mid inferolateral akinesis. ?3.  Normal RV size and systolic function. ?4. Delayed enhancement images were also very difficult. However, ?there was clearly a dense basal to mid inferolateral scar. This ?looked most consistent with a prior MI in the LCx territory. ?  ?Echo 9/21: EF 30-35%, RV ok. ? ?Echo 09/03/19: EF 25% RV ok. Personally reviewed ? ?Echo 11/19 EF ~35-40% with severe dyssynchrony due to LBBB and PVCs Personally reviewed ? ?Zio patch 10/20: 4.8%PVCs ?  ?SH:  ?Social History  ? ?Socioeconomic History  ? Marital status: Married   ?  Spouse name: Kevin Hampton  ? Number of children: 1  ? Years of education: Not on file  ? Highest education level: Not on file  ?Occupational History  ? Occupation: Minister/carpenter/musician  ?Tobacco Use  ? Smoking status: Former  ? Smokeless tobacco: Never  ?Vaping Use  ? Vaping Use: Never used  ?Substance and Sexual Activity  ? Alcohol use: Never  ? Drug use: Never  ? Sexual activity: Yes  ?Other Topics Concern  ? Not on file  ?Social History Narrative  ? Not on file  ? ?Social Determinants of Health  ? ?Financial Resource Strain: Not on file  ?Food Insecurity: Not on file  ?Transportation Needs: Not on file  ?Physical Activity: Not on file  ?Stress: Not on file  ?Social Connections: Not on file  ?Intimate Partner Violence: Not on file  ? ? ?FH:  ?Family History  ?Problem Relation Age of Onset  ? Hypertension Mother   ? Heart attack Mother   ? Hypertension Father   ? Hypertension Sister   ? Hypertension Brother   ? ? ?Past Medical History:  ?Diagnosis Date  ? Acromegaly ([place])   ? CHF (congestive heart failure) ([place])   ? Essential hypertension   ? Hypertensive emergency 12/15/2018  ? Nonischemic cardiomyopathy ([place])   ? Obesity   ? Pituitary macroadenoma ([place])   ? Systolic heart failure ([place])   ? Type 2 diabetes mellitus with complication (HCC)   ? ? ?Current Outpatient Medications  ?Medication Sig Dispense Refill  ? amiodarone (  PACERONE) 200 MG tablet Take 1 tablet by mouth once daily 90 tablet 3  ? ENTRESTO 24-26 MG Take 1 tablet by mouth twice daily 60 tablet 0  ? glipiZIDE (GLUCOTROL) 10 MG tablet Take 1 tablet (10 mg total) by mouth 2 (two) times daily before a meal. 180 tablet 1  ? spironolactone (ALDACTONE) 25 MG tablet Take 1 tablet by mouth once daily 45 tablet 2  ? ?No current facility-administered medications for this encounter.  ? ? ?Vitals:  ? 10/11/21 1029  ?BP: 122/78  ?Pulse: 61  ?SpO2: 98%  ?Weight: 117.3 kg (258 lb 9.6 oz)  ? ?Wt Readings from Last 3 Encounters:  ?10/11/21 117.3 kg (258 lb 9.6 oz)   ?02/21/21 120.7 kg (266 lb 2 oz)  ?11/19/20 117.2 kg (258 lb 6.4 oz)  ? ? ?PHYSICAL EXAM: ?General:  Well appearing. No resp difficulty ?HEENT: normal ?Neck: supple. no JVD. Carotids 2+ bilat; no bruits. No lymphadenopathy or thryomegaly appreciated. ?Cor: PMI nondisplaced. Irregular rate & rhythm. No rubs, gallops or murmurs. ?Lungs: clear ?Abdomen: soft, nontender, nondistended. No hepatosplenomegaly. No bruits or masses. Good bowel sounds. ?Extremities: no cyanosis, clubbing, rash, edema ?Neuro: alert & orientedx3, cranial nerves grossly intact. moves all 4 extremities w/o difficulty. Affect pleasant ? ?ECG: NSR 81  LBBB (148m) Frequent monomorphic PVCs Personally reviewed ? ? ?ASSESSMENT & PLAN: ? ?1. Chronic systolic HF ?- diagnosed 77/82EF ~25-30%. NICM ?- cath 7/20 with normal coronaries and compensated hemodynamics ?- suspect he likely has LBBB +/- PVC-induced CM  ?- Echo 09/03/19 EF ~25% with severe LV dyssynchrony (no improvement with PVC suppression) ?- Zio patch 10/20 with 4.8% PVC  ?- cMRI 5/21 EF 20-25% suggestive of PVC/LBBB CM but presence of ? Inferolateral scar is puzzling.  ?- Unclear if cardiomyopathy related to PVCs, LBBB (or both). EF initially did not get better with PVC suppression.  ?- Seen by EP for consideration of CRT, felt due to lack of significant symptoms and no activity limitations, low risk for cardiac arrest and would defer device at this time pending ongoing EF improvement. ?- Echo 9/21 EF 30-35%, severe decrease LV function with dyssynchrony, RV normal ?- Echo 08/23/20: EF 20-25% + septal dyssynchrony RV ok. ?- He is slightly worse today. NYHA II-early III. Volume status ok  ?- Continue Entresto 24/26 mg BID (did not tolerate 49/51 dose) ?- Continue spiro 25 mg daily. ?- Off Farxiga for unclear reasons (thinks due to cost). Will restart today ?- Will repeat echo and Zio patch. If PVC burden still elevated will need better control of PVCs prior to ICD referral. If PVC burden low  and EF < = 35% -> CRT D ? ?2. Frequent PVCs ?- Amio decreased to 100 daily. PVCs now back on ECG ?- zio 10/20 4.8%  ?- Will repeat Zio  ?- Can consider switch to mexilitene as needed.  ?- home sleep study with AHI 3. (No significant OSA) ? ?3. HTN ?- Blood pressure well controlled. Continue current regimen. ? ?4. LBBB ?- discussion as above. Has seen EP and CRT consideration deferred.  ?- See plan as above.  ? ?5. Poorly controlled DM2 ?- Per his PCP. HgBA1c was 11.8 on 6/21 ?- Resume Farxiga ?- Hgba1c improving. Recent A1c was 8.1 on 08/18/20 ? ?6. Acromegaly ?- has growth hormone excess ?- S/p  transphenoid pituitary resection surgery & sinus septoplasty 12/21 ? ?DGlori Bickers MD  ?11:08 AM ? ? ?

## 2021-10-11 NOTE — Progress Notes (Signed)
Zio patch placed onto patient.  All instructions and information reviewed with patient, they verbalize understanding with no questions. 

## 2021-10-12 LAB — T3, FREE: T3, Free: 4.2 pg/mL (ref 2.0–4.4)

## 2021-10-21 ENCOUNTER — Ambulatory Visit (HOSPITAL_COMMUNITY)
Admission: RE | Admit: 2021-10-21 | Discharge: 2021-10-21 | Disposition: A | Discharge status: Home / Self Care | Payer: 59 | Source: Ambulatory Visit | Attending: Internal Medicine | Admitting: Internal Medicine

## 2021-10-21 DIAGNOSIS — I083 Combined rheumatic disorders of mitral, aortic and tricuspid valves: Secondary | ICD-10-CM | POA: Diagnosis not present

## 2021-10-21 DIAGNOSIS — I5022 Chronic systolic (congestive) heart failure: Secondary | ICD-10-CM | POA: Insufficient documentation

## 2021-10-21 DIAGNOSIS — I110 Hypertensive heart disease with heart failure: Secondary | ICD-10-CM | POA: Insufficient documentation

## 2021-10-21 DIAGNOSIS — F17200 Nicotine dependence, unspecified, uncomplicated: Secondary | ICD-10-CM | POA: Insufficient documentation

## 2021-10-21 LAB — ECHOCARDIOGRAM COMPLETE
AV Mean grad: 6 mmHg
AV Peak grad: 10.8 mmHg
Ao pk vel: 1.64 m/s
Area-P 1/2: 4.21 cm2
Calc EF: 26.2 %
S' Lateral: 3.9 cm
Single Plane A2C EF: 26.3 %
Single Plane A4C EF: 31.6 %

## 2021-10-21 NOTE — Progress Notes (Signed)
?  Echocardiogram ?2D Echocardiogram has been performed. ? ?Elmer Ramp ?10/21/2021, 11:47 AM ?

## 2021-10-23 ENCOUNTER — Other Ambulatory Visit (HOSPITAL_COMMUNITY): Payer: Self-pay | Admitting: Internal Medicine

## 2021-10-24 ENCOUNTER — Telehealth (HOSPITAL_COMMUNITY): Payer: Self-pay

## 2021-10-24 NOTE — Telephone Encounter (Signed)
Patient called to report that since starting the Farxiga on 5/3 he has had discharge,burning while urinating,dizziness,nausea and fatigue. Since stopping the [name] on 5/13 all his symptoms have subsided. Please advise.  ?

## 2021-10-24 NOTE — Addendum Note (Signed)
Addended byShela Nevin R on: 4/97/5300 11:13 AM ? ? Modules accepted: Orders ? ?

## 2021-10-24 NOTE — Telephone Encounter (Signed)
Patient advised and verbalized understanding. Med list updated to reflect changes.  ? ?

## 2021-10-31 ENCOUNTER — Other Ambulatory Visit: Payer: Self-pay | Admitting: Nurse Practitioner

## 2021-11-02 NOTE — Telephone Encounter (Signed)
Left message to make appointment. Courtesy refill. Requested Prescriptions  Pending Prescriptions Disp Refills  . spironolactone (ALDACTONE) 25 MG tablet [Pharmacy Med Name: Spironolactone 25 MG Oral Tablet] 45 tablet 0    Sig: Take 1 tablet by mouth once daily     Cardiovascular: Diuretics - Aldosterone Antagonist Failed - 10/31/2021  5:56 PM      Failed - Cr in normal range and within 180 days    Creatinine, Ser  Date Value Ref Range Status  10/11/2021 1.30 (H) 0.61 - 1.24 mg/dL Final         Failed - Valid encounter within last 6 months    Recent Outpatient Visits          8 months ago Type 2 diabetes mellitus with hyperglycemia, without long-term current use of insulin ([place])   [place], Vernia Buff, NP   11 months ago Type 2 diabetes mellitus with hyperglycemia, without long-term current use of insulin ([place])   [place], Vernia Buff, NP   1 year ago Type 2 diabetes mellitus with hyperglycemia, without long-term current use of insulin ([place])   [place], Vernia Buff, NP   1 year ago Type 2 diabetes mellitus with periodontal disease, without long-term current use of insulin ([place])   [place], [place] W, NP   1 year ago Type 2 diabetes mellitus with periodontal disease, without long-term current use of insulin ([place])   [place], [place] W, NP             Passed - K in normal range and within 180 days    Potassium  Date Value Ref Range Status  10/11/2021 4.6 3.5 - 5.1 mmol/L Final         Passed - Na in normal range and within 180 days    Sodium  Date Value Ref Range Status  10/11/2021 136 135 - 145 mmol/L Final  02/21/2021 143 134 - 144 mmol/L Final         Passed - eGFR is 30 or above and within 180 days    GFR calc Af Amer  Date Value Ref Range Status  04/06/2020 85 >59 mL/min/1.73  Final    Comment:    **In accordance with recommendations from the NKF-ASN Task force,**   [facility] is in the process of updating its eGFR calculation to the   2021 CKD-EPI creatinine equation that estimates kidney function   without a race variable.    GFR, Estimated  Date Value Ref Range Status  10/11/2021 >60 >60 mL/min Final    Comment:    (NOTE) Calculated using the CKD-EPI Creatinine Equation (2021)    GFR  Date Value Ref Range Status  06/24/2020 69.86 >60.00 mL/min Final    Comment:    Calculated using the CKD-EPI Creatinine Equation (2021)   eGFR  Date Value Ref Range Status  02/21/2021 64 >59 mL/min/1.73 Final         Passed - Last BP in normal range    BP Readings from Last 1 Encounters:  10/11/21 122/78

## 2021-11-24 ENCOUNTER — Other Ambulatory Visit (HOSPITAL_COMMUNITY): Payer: Self-pay

## 2021-11-24 MED ORDER — ENTRESTO 24-26 MG PO TABS: 1.0000 | ORAL_TABLET | Freq: Two times a day (BID) | ORAL | 1 refills | Status: DC

## 2021-12-07 ENCOUNTER — Ambulatory Visit: Payer: 59 | Attending: Physician Assistant | Admitting: Physician Assistant

## 2021-12-07 ENCOUNTER — Encounter: Payer: Self-pay | Admitting: Physician Assistant

## 2021-12-07 VITALS — BP 134/83 | HR 69 | Wt 249.8 lb

## 2021-12-07 DIAGNOSIS — I10 Essential (primary) hypertension: Secondary | ICD-10-CM

## 2021-12-07 DIAGNOSIS — E1165 Type 2 diabetes mellitus with hyperglycemia: Secondary | ICD-10-CM

## 2021-12-07 DIAGNOSIS — E220 Acromegaly and pituitary gigantism: Secondary | ICD-10-CM | POA: Diagnosis not present

## 2021-12-07 DIAGNOSIS — R739 Hyperglycemia, unspecified: Secondary | ICD-10-CM

## 2021-12-07 LAB — POCT URINALYSIS DIP (CLINITEK)
Bilirubin, UA: NEGATIVE
Blood, UA: NEGATIVE
Glucose, UA: >=1000 mg/dL — AB
Ketones, POC UA: NEGATIVE mg/dL
Leukocytes, UA: NEGATIVE
Nitrite, UA: NEGATIVE
POC PROTEIN,UA: NEGATIVE
Spec Grav, UA: 1.01 (ref 1.010–1.025)
Urobilinogen, UA: 0.2 E.U./dL
pH, UA: 6 (ref 5.0–8.0)

## 2021-12-07 LAB — POCT GLYCOSYLATED HEMOGLOBIN (HGB A1C): HbA1c POC (<> result, manual entry): >15 % (ref 4.0–5.6)

## 2021-12-07 LAB — GLUCOSE, POCT (MANUAL RESULT ENTRY)
POC Glucose: 437 mg/dl — AB (ref 70–99)
POC Glucose: 444 mg/dl — AB (ref 70–99)

## 2021-12-07 MED ORDER — GLIPIZIDE 10 MG PO TABS: 10.0000 mg | ORAL_TABLET | Freq: Two times a day (BID) | ORAL | 1 refills | Status: DC

## 2021-12-07 MED ORDER — ENTRESTO 24-26 MG PO TABS: 1.0000 | ORAL_TABLET | Freq: Two times a day (BID) | ORAL | 1 refills | Status: DC

## 2021-12-07 MED ORDER — LANTUS SOLOSTAR 100 UNIT/ML ~~LOC~~ SOPN: 15.0000 [IU] | PEN_INJECTOR | Freq: Every day | SUBCUTANEOUS | 99 refills | Status: DC

## 2021-12-07 MED ORDER — PEN NEEDLES 31G X 5 MM MISC: 1.0000 | Freq: Every day | 11 refills | Status: DC

## 2021-12-07 MED ORDER — SPIRONOLACTONE 25 MG PO TABS: 25.0000 mg | ORAL_TABLET | Freq: Every day | ORAL | 0 refills | Status: DC

## 2021-12-07 MED ORDER — INSULIN ASPART 100 UNIT/ML IJ SOLN
20.0000 [IU] | Freq: Once | INTRAMUSCULAR | Status: AC
  Administered 2021-12-07: 20 [IU] via SUBCUTANEOUS

## 2021-12-07 NOTE — Progress Notes (Unsigned)
Kevin Hampton, is a 56 y.o. male  SJG:[id number]  [id number]  DOB - 1965/12/18  Chief Complaint  Patient presents with   Diabetes   Medication Refill       Subjective:   Kevin Hampton is a 56 y.o. male here today for med RF.  Says he is compliant with meds.  Does not follow diabetic diet.  Says when he checks blood sugars at home they are in the 200s but he admits to checking them sporadically and not often.  Needs med RF.  Blood sugar >400.  +polyuria/polydipsia.  No problems updated.  ALLERGIES: Allergies  Allergen Reactions   Coreg [Carvedilol] Other (See Comments)    Brings heart rate and blood pressure down severly   Farxiga [Dapagliflozin] Nausea Only    PAST MEDICAL HISTORY: Past Medical History:  Diagnosis Date   Acromegaly ([place])    CHF (congestive heart failure) ([place])    Essential hypertension    Hypertensive emergency 12/15/2018   Nonischemic cardiomyopathy ([place])    Obesity    Pituitary macroadenoma (HCC)    Systolic heart failure (HCC)    Type 2 diabetes mellitus with complication ([place])     MEDICATIONS AT HOME: Prior to Admission medications   Medication Sig Start Date End Date Taking? Authorizing Provider  amiodarone (PACERONE) 200 MG tablet Take 1 tablet by mouth once daily 10/12/20  Yes Bensimhon, Shaune Pascal, MD  insulin glargine (LANTUS SOLOSTAR) 100 UNIT/ML Solostar Pen Inject 15 Units into the skin daily. 12/07/21  Yes Curtisha Bendix M, PA-C  Insulin Pen Needle (PEN NEEDLES) 31G X 5 MM MISC 1 each by Does not apply route daily. 12/07/21  Yes Ryn Peine M, PA-C  glipiZIDE (GLUCOTROL) 10 MG tablet Take 1 tablet (10 mg total) by mouth 2 (two) times daily before a meal. 12/07/21 06/05/22  Kazimir Hartnett, Dionne Bucy, PA-C  sacubitril-valsartan (ENTRESTO) 24-26 MG Take 1 tablet by mouth 2 (two) times daily. 12/07/21   Argentina Donovan, PA-C  spironolactone (ALDACTONE) 25 MG tablet Take 1 tablet (25 mg total) by mouth daily. 12/07/21   Hadleigh Felber, Dionne Bucy, PA-C     ROS: Neg HEENT Neg resp Neg cardiac Neg GI Neg GU Neg MS Neg psych Neg neuro  Objective:   Vitals:   12/07/21 1348  BP: 134/83  Pulse: 69  SpO2: 97%  Weight: 249 lb 12.8 oz (113.3 kg)   Exam General appearance : Awake, alert, not in any distress. Speech Clear. Not toxic looking HEENT: Atraumatic and Normocephalic Neck: Supple, no JVD. No cervical lymphadenopathy.  Chest: Good air entry bilaterally, CTAB.  No rales/rhonchi/wheezing CVS: S1 S2 regular, no murmurs.  Extremities: B/L Lower Ext shows no edema, both legs are warm to touch Neurology: Awake alert, and oriented X 3, CN II-XII intact, Non focal Skin: No Rash  Data Review Lab Results  Component Value Date   HGBA1C >15 12/07/2021   HGBA1C 7.1 (A) 02/21/2021   HGBA1C 7.1 (A) 11/19/2020    Assessment & Plan   1. Type 2 diabetes mellitus with hyperglycemia, without long-term current use of insulin (HCC) Uncontrolled-start lantus-15 units.  Check blood sugars fasting and bedtime.  He verbalizes understanding.   - Glucose (CBG) - HgB A1c - POCT URINALYSIS DIP (CLINITEK) - glipiZIDE (GLUCOTROL) 10 MG tablet; Take 1 tablet (10 mg total) by mouth 2 (two) times daily before a meal.  Dispense: 180 tablet; Refill: 1 - Insulin Pen Needle (PEN NEEDLES) 31G X 5 MM MISC; 1 each by Does not apply  route daily.  Dispense: 100 each; Refill: 11 - insulin glargine (LANTUS SOLOSTAR) 100 UNIT/ML Solostar Pen; Inject 15 Units into the skin daily.  Dispense: 15 mL; Refill: PRN - Comprehensive metabolic panel; Future  2. Hyperglycemia Other labs just done May 2022 - POCT URINALYSIS DIP (CLINITEK) - insulin aspart (novoLOG) injection 20 Units - Comprehensive metabolic panel; Future  3. Acromegaly ([place]) - spironolactone (ALDACTONE) 25 MG tablet; Take 1 tablet (25 mg total) by mouth daily.  Dispense: 90 tablet; Refill: 0  4. Primary hypertension Controlled.   - sacubitril-valsartan (ENTRESTO) 24-26 MG; Take 1 tablet by mouth  2 (two) times daily.  Dispense: 180 tablet; Refill: 1 - Comprehensive metabolic panel; Future    Return in about 3 weeks (around 12/28/2021) for 3 wks with Lurena Joiner for DM and labs;  3 months with PCP.  The patient was given clear instructions to go to ER or return to medical center if symptoms don't improve, worsen or new problems develop. The patient verbalized understanding. The patient was told to call to get lab results if they haven't heard anything in the next week.      Freeman Caldron, PA-C [facility] and [place], [place]   12/07/2021, 2:50 PM Patient ID: Kevin Hampton, male   DOB: 10-Sep-1965, 56 y.o.   MRN: [id number]

## 2021-12-07 NOTE — Patient Instructions (Signed)
Drink 80-100 ounces water daily  Take blood sugars fasting and bedtime and record and bring to next visit

## 2022-02-08 ENCOUNTER — Ambulatory Visit: Payer: 59 | Attending: Physician Assistant | Admitting: Physician Assistant

## 2022-02-08 ENCOUNTER — Encounter: Payer: Self-pay | Admitting: Physician Assistant

## 2022-02-08 VITALS — BP 123/91 | HR 61 | Ht 76.0 in | Wt 254.6 lb

## 2022-02-08 DIAGNOSIS — I5022 Chronic systolic (congestive) heart failure: Secondary | ICD-10-CM

## 2022-02-08 DIAGNOSIS — E1165 Type 2 diabetes mellitus with hyperglycemia: Secondary | ICD-10-CM

## 2022-02-08 DIAGNOSIS — E785 Hyperlipidemia, unspecified: Secondary | ICD-10-CM | POA: Diagnosis not present

## 2022-02-08 DIAGNOSIS — I10 Essential (primary) hypertension: Secondary | ICD-10-CM | POA: Diagnosis not present

## 2022-02-08 LAB — POCT GLYCOSYLATED HEMOGLOBIN (HGB A1C): HbA1c POC (<> result, manual entry): >15 % (ref 4.0–5.6)

## 2022-02-08 LAB — GLUCOSE, POCT (MANUAL RESULT ENTRY): POC Glucose: 343 mg/dl — AB (ref 70–99)

## 2022-02-08 MED ORDER — GLIPIZIDE 10 MG PO TABS: 10.0000 mg | ORAL_TABLET | Freq: Two times a day (BID) | ORAL | 1 refills | Status: DC

## 2022-02-08 MED ORDER — PEN NEEDLES 31G X 5 MM MISC: 1.0000 | Freq: Every day | 11 refills | Status: DC

## 2022-02-08 MED ORDER — LANTUS SOLOSTAR 100 UNIT/ML ~~LOC~~ SOPN: 22.0000 [IU] | PEN_INJECTOR | Freq: Every day | SUBCUTANEOUS | 99 refills | Status: DC

## 2022-02-08 NOTE — Progress Notes (Signed)
Patient ID: Kevin Hampton, male   DOB: [birth year], 56 y.o.   MRN: [id number]   Kevin Hampton, is a 56 y.o. male  [id number]  [id number]  DOB - [birth year]  Chief Complaint  Patient presents with   Diabetes       Subjective:   Kevin Hampton is a 56 y.o. male here today for med RF.  He is not checking blood sugars or following diabetic diet.  He says he is taking lantus EVERY day (maybe missed 2 doses in 3 months?).  He denies polyuria/polydipsia.  No CP/SOB.  He sees cardiology 02/16/2022 No problems updated.  ALLERGIES: Allergies  Allergen Reactions   Coreg [Carvedilol] Other (See Comments)    Brings heart rate and blood pressure down severly   Farxiga [Dapagliflozin] Nausea Only    PAST MEDICAL HISTORY: Past Medical History:  Diagnosis Date   Acromegaly ([place])    CHF (congestive heart failure) ([place])    Essential hypertension    Hypertensive emergency 12/15/2018   Nonischemic cardiomyopathy ([place])    Obesity    Pituitary macroadenoma (HCC)    Systolic heart failure (HCC)    Type 2 diabetes mellitus with complication ([place])     MEDICATIONS AT HOME: Prior to Admission medications   Medication Sig Start Date End Date Taking? Authorizing Provider  amiodarone (PACERONE) 200 MG tablet Take 1 tablet by mouth once daily 10/12/20   Bensimhon, Shaune Pascal, MD  glipiZIDE (GLUCOTROL) 10 MG tablet Take 1 tablet (10 mg total) by mouth 2 (two) times daily before a meal. 02/08/22 08/07/22  Keiran Gaffey, Dionne Bucy, PA-C  insulin glargine (LANTUS SOLOSTAR) 100 UNIT/ML Solostar Pen Inject 22 Units into the skin daily. 02/08/22   Argentina Donovan, PA-C  Insulin Pen Needle (PEN NEEDLES) 31G X 5 MM MISC 1 each by Does not apply route daily. 02/08/22   Argentina Donovan, PA-C  sacubitril-valsartan (ENTRESTO) 24-26 MG Take 1 tablet by mouth 2 (two) times daily. 12/07/21   Argentina Donovan, PA-C  spironolactone (ALDACTONE) 25 MG tablet Take 1 tablet (25 mg total) by mouth daily. 12/07/21   Keegan Bensch, Dionne Bucy,  PA-C    ROS: Neg HEENT Neg resp Neg cardiac Neg GI Neg GU Neg MS Neg psych Neg neuro  Objective:   Vitals:   02/08/22 1345  BP: (!) 123/91  Pulse: 61  SpO2: 100%  Weight: 254 lb 9.6 oz (115.5 kg)  Height: '6\' 4"'$  (1.93 m)   Exam General appearance : Awake, alert, not in any distress. Speech Clear. Not toxic looking HEENT: Atraumatic and Normocephalic Neck: Supple, no JVD. No cervical lymphadenopathy.  Chest: Good air entry bilaterally, CTAB.  No rales/rhonchi/wheezing CVS: S1 S2 regular, no murmurs.  Extremities: B/L Lower Ext shows no edema, both legs are warm to touch Neurology: Awake alert, and oriented X 3, CN II-XII intact, Non focal Skin: No Rash  Data Review Lab Results  Component Value Date   HGBA1C >15 02/08/2022   HGBA1C >15 12/07/2021   HGBA1C 7.1 (A) 02/21/2021    Assessment & Plan   1. Type 2 diabetes mellitus with hyperglycemia, without long-term current use of insulin (HCC) Uncontrolled-I am uncertain about compliance(doubt compliance). Check blood sugars fasting and bedtime and record.  Have these numbers available for your next visit.  Drink 80-100 ounces water daily.  Eliminate sugars and starchy foods - Glucose (CBG) - HgB A1c - insulin glargine (LANTUS SOLOSTAR) 100 UNIT/ML Solostar Pen; Inject 22 Units into the skin daily.  Dispense: 15 mL;  Refill: PRN - Insulin Pen Needle (PEN NEEDLES) 31G X 5 MM MISC; 1 each by Does not apply route daily.  Dispense: 100 each; Refill: 11 - glipiZIDE (GLUCOTROL) 10 MG tablet; Take 1 tablet (10 mg total) by mouth 2 (two) times daily before a meal.  Dispense: 180 tablet; Refill: 1 - Comprehensive metabolic panel; Future  2. Essential hypertension Continue entresto - Comprehensive metabolic panel; Future  3. Dyslipidemia, goal LDL below 70 Sees cardiology 9/7  4. Chronic systolic heart failure [facility]) Sees cardiology 9/7; on spirinolactone/amio    Return for 3 weeks with Lurena Joiner DM; 3 months with [facility]da/pcp  chronic conditions.  The patient was given clear instructions to go to ER or return to medical center if symptoms don't improve, worsen or new problems develop. The patient verbalized understanding. The patient was told to call to get lab results if they haven't heard anything in the next week.      Freeman Caldron, PA-C [facility] and [facility] [place], [place]   02/08/2022, 1:58 PM

## 2022-02-08 NOTE — Patient Instructions (Addendum)
Check blood sugars fasting and bedtime and record.  Have these numbers available for your next visit.  Drink 80-100 ounces water daily.  Eliminate sugars and starchy foods

## 2022-02-15 NOTE — Progress Notes (Signed)
ADVANCED HF CLINIC NOTE  HPI:  [name] is a 56 yo male with  HTN, poorly-controlled DM2, acromegaly and systolic HF due to NICM.   Admitted 7/20 with hypertensive urgency and acute systolic HF. ECG showed LBBB of uncertain duration. Echo EF 25-30%.  Cardiac catheterization on 12/16/18 showed normal coronary arteries with well compensated filling pressures after diuresis.   He was started on GDMT including carvedilol 6.25 bid, Entresto 49/51 bid, spironolactone 12.5 daily. He did well for several days but then developed severe fatigue. I saw him in Clinic on 12/27/18. He was in bigeminy. I stopped his b-blocker and started amio 200 bid.K 5.0 Mg 1.7  Echo 9/21: EF 30-35%, RV ok.  Remains on amio to suppress PVCs. 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 Referred to Dr. Monna Fam. Underwent transphenoidal pituitary resection in 12/21  Here for f/u. Wife passed away from HF on 06-15-21. Now living with his brother and his wife. Still struggling with the loss of his wife, states he's been eating out a lot since then. Recently cutting back his soda intake. Overall feels ok. Can do basic activities without too much problems but legs get weak at times. Gets SOB with climbing steps. + easy fatigues, doesn't tolerate the heat. No problems with ADLs. No edema, orthopnea or PND. Compliant with meds.   Echo 08/23/20: EF 20-25% + septal dyssynchrony RV ok.  ECHO 5/23 EF 25-30% RV mildly HK  Studies  cMRI 10/21/19 1. Very difficult study due to frequent ectopy and respiratory artifact. 2. Moderately dilated left ventricle with estimated EF 20-25% (unable to quantify due to respiratory artifact and ectopy). Diffuse hypokinesis with basal to mid inferolateral akinesis. 3.  Normal RV size and systolic function. 4. Delayed enhancement images were also very difficult. However, there was clearly a dense basal to mid inferolateral scar. This looked most consistent with a prior MI in the LCx territory.   Echo  9/21: EF 30-35%, RV ok.  Echo 09/03/19: EF 25% RV ok. Personally reviewed  Echo 11/19 EF ~35-40% with severe dyssynchrony due to LBBB and PVCs Personally reviewed  Zio patch 10/20: 4.8%PVCs Zio 5/23: 8 runs NSVT, 3.5% PVCs    SH:  Social History   Socioeconomic History   Marital status: Widowed    Spouse name: Everlene Farrier   Number of children: 1   Years of education: Not on file   Highest education level: Not on file  Occupational History   Occupation: Minister/carpenter/musician  Tobacco Use   Smoking status: Former   Smokeless tobacco: Never  Scientific laboratory technician Use: Never used  Substance and Sexual Activity   Alcohol use: Never   Drug use: Never   Sexual activity: Yes  Other Topics Concern   Not on file  Social History Narrative   Not on file   Social Determinants of Health   Financial Resource Strain: Not on file  Food Insecurity: No Food Insecurity (02/16/2022)   Hunger Vital Sign    Worried About Running Out of Food in the Last Year: Never true    Ran Out of Food in the Last Year: Never true  Transportation Needs: No Transportation Needs (02/16/2022)   PRAPARE - Hydrologist (Medical): No    Lack of Transportation (Non-Medical): No  Physical Activity: Not on file  Stress: Not on file  Social Connections: Not on file  Intimate Partner Violence: Not on file    FH:  Family  History  Problem Relation Age of Onset   Hypertension Mother    Heart attack Mother    Hypertension Father    Hypertension Sister    Hypertension Brother     Past Medical History:  Diagnosis Date   Acromegaly ([place])    CHF (congestive heart failure) ([place])    Essential hypertension    Hypertensive emergency 12/15/2018   Nonischemic cardiomyopathy ([place])    Obesity    Pituitary macroadenoma ([facility])    Systolic heart failure ([facility])    Type 2 diabetes mellitus with complication ([facility])     Current Outpatient Medications  Medication Sig Dispense Refill   amiodarone  (PACERONE) 200 MG tablet Take 1 tablet by mouth once daily 90 tablet 3   glipiZIDE (GLUCOTROL) 10 MG tablet Take 1 tablet (10 mg total) by mouth 2 (two) times daily before a meal. 180 tablet 1   insulin glargine (LANTUS SOLOSTAR) 100 UNIT/ML Solostar Pen Inject 22 Units into the skin daily. 15 mL PRN   Insulin Pen Needle (PEN NEEDLES) 31G X 5 MM MISC 1 each by Does not apply route daily. 100 each 11   sacubitril-valsartan (ENTRESTO) 24-26 MG Take 1 tablet by mouth 2 (two) times daily. 180 tablet 1   spironolactone (ALDACTONE) 25 MG tablet Take 1 tablet (25 mg total) by mouth daily. 90 tablet 0   No current facility-administered medications for this encounter.    Vitals:   02/16/22 1014 02/16/22 1042  BP: (!) 154/108 (!) 139/90  Pulse: 76   SpO2: 98%   Weight: 116.5 kg (256 lb 12.8 oz)   Height: '6\' 4"'$  (1.93 m)     Wt Readings from Last 3 Encounters:  02/16/22 116.5 kg (256 lb 12.8 oz)  02/08/22 115.5 kg (254 lb 9.6 oz)  12/07/21 113.3 kg (249 lb 12.8 oz)    PHYSICAL EXAM: General:  Well appearing. No resp difficulty HEENT: normal Neck: supple. no JVD. Carotids 2+ bilat; no bruits. No lymphadenopathy or thryomegaly appreciated. Cor: PMI nondisplaced. Regular rate & rhythm. No rubs, gallops or murmurs. Lungs: clear Abdomen: soft, nontender, nondistended. No hepatosplenomegaly. No bruits or masses. Good bowel sounds. Extremities: no cyanosis, clubbing, rash, edema Neuro: alert & orientedx3, cranial nerves grossly intact. moves all 4 extremities w/o difficulty. Affect pleasant   ECG 5/23: NSR 81  LBBB (180m) Frequent monomorphic PVCs  ASSESSMENT & PLAN:  1. Chronic systolic HF - diagnosed 70/86EF ~25-30%. NICM - cath 7/20 with normal coronaries and compensated hemodynamics - suspect he likely has LBBB +/- PVC-induced CM  - Echo 09/03/19 EF ~25% with severe LV dyssynchrony (no improvement with PVC suppression) - Echo 9/21 EF 30-35%, severe decrease LV function with  dyssynchrony, RV normal - Echo 08/23/20: EF 20-25% + septal dyssynchrony RV ok. - Echo 5/23 EF 25-30% - Zio 5/23: 8 runs NSVT, 3.5% PVCs  - Zio patch 10/20 with 4.8% PVC  - cMRI 5/21 EF 20-25% suggestive of PVC/LBBB CM but presence of ? Inferolateral scar is puzzling.  - PVCs mostly suppressed EF still down,. Suspect LBBB CM - Seen by EP for consideration of CRT, felt due to lack of significant symptoms and no activity limitations, low risk for cardiac arrest and would defer device at this time pending ongoing EF improvement. - NYHA II-early III. Volume status ok  - Increase Entresto to 49/51 mg BID  - Continue spiro 25 mg daily. - Off Farxiga due to intolerance (nausea) will not reattempt currently until A1c bettrer controlled - Given persistent  LV dysfunction and NYHA II-III symptoms I think he will benefit from CRT but will wait until DM2 better controlled first  2. Frequent PVCs - Amio decreased to 100 daily.  - zio 10/20 4.8%  - Zio 5/23 3.5% PVCs - Can consider switch to mexilitene as needed.  - home sleep study with AHI 3. (No significant OSA)  3. HTN - Blood pressure well controlled. Continue current regimen.  4. LBBB - discussion as above. Has seen EP and CRT consideration deferred.  - Given persistent LV dysfunction and NYHA II-III symptoms I think he will benefit from CRT but will wait until DM2 better controlled first  5. Poorly controlled DM2 - Has been eating poorly since wife's death - HGBA1c 15 - Referred back to Endo - Asked him to watch his intake closely - No SGLT2i for now   6. Acromegaly - has growth hormone excess - S/p  transphenoid pituitary resection surgery & sinus septoplasty 12/21  Glori Bickers, MD  10:46 AM

## 2022-02-16 ENCOUNTER — Ambulatory Visit (HOSPITAL_COMMUNITY)
Admission: RE | Admit: 2022-02-16 | Discharge: 2022-02-16 | Disposition: A | Discharge status: Home / Self Care | Payer: PRIVATE HEALTH INSURANCE | Source: Ambulatory Visit | Attending: Internal Medicine | Admitting: Internal Medicine

## 2022-02-16 ENCOUNTER — Encounter (HOSPITAL_COMMUNITY): Payer: Self-pay | Admitting: Internal Medicine

## 2022-02-16 VITALS — BP 139/90 | HR 76 | Ht 76.0 in | Wt 256.8 lb

## 2022-02-16 DIAGNOSIS — I5022 Chronic systolic (congestive) heart failure: Secondary | ICD-10-CM | POA: Insufficient documentation

## 2022-02-16 DIAGNOSIS — E220 Acromegaly and pituitary gigantism: Secondary | ICD-10-CM | POA: Diagnosis not present

## 2022-02-16 DIAGNOSIS — E1100 Type 2 diabetes mellitus with hyperosmolarity without nonketotic hyperglycemic-hyperosmolar coma (NKHHC): Secondary | ICD-10-CM

## 2022-02-16 DIAGNOSIS — Z79899 Other long term (current) drug therapy: Secondary | ICD-10-CM | POA: Diagnosis not present

## 2022-02-16 DIAGNOSIS — I110 Hypertensive heart disease with heart failure: Secondary | ICD-10-CM | POA: Diagnosis not present

## 2022-02-16 DIAGNOSIS — Z794 Long term (current) use of insulin: Secondary | ICD-10-CM

## 2022-02-16 DIAGNOSIS — I428 Other cardiomyopathies: Secondary | ICD-10-CM | POA: Insufficient documentation

## 2022-02-16 DIAGNOSIS — I493 Ventricular premature depolarization: Secondary | ICD-10-CM | POA: Insufficient documentation

## 2022-02-16 DIAGNOSIS — E1165 Type 2 diabetes mellitus with hyperglycemia: Secondary | ICD-10-CM | POA: Diagnosis not present

## 2022-02-16 DIAGNOSIS — I447 Left bundle-branch block, unspecified: Secondary | ICD-10-CM | POA: Diagnosis not present

## 2022-02-16 DIAGNOSIS — I10 Essential (primary) hypertension: Secondary | ICD-10-CM

## 2022-02-16 LAB — BASIC METABOLIC PANEL
Anion gap: 9 (ref 5–15)
BUN: 16 mg/dL (ref 6–20)
CO2: 25 mmol/L (ref 22–32)
Calcium: 9.1 mg/dL (ref 8.9–10.3)
Chloride: 99 mmol/L (ref 98–111)
Creatinine, Ser: 1.31 mg/dL — ABNORMAL HIGH (ref 0.61–1.24)
GFR, Estimated: >60 mL/min (ref >60)
Glucose, Bld: 482 mg/dL — ABNORMAL HIGH (ref 70–99)
Potassium: 4.6 mmol/L (ref 3.5–5.1)
Sodium: 133 mmol/L — ABNORMAL LOW (ref 135–145)

## 2022-02-16 LAB — BRAIN NATRIURETIC PEPTIDE: B Natriuretic Peptide: 25.2 pg/mL (ref 0.0–100.0)

## 2022-02-16 NOTE — Patient Instructions (Signed)
Medication Changes:  INCREASE Entresto to 49/51 mg Twice daily, **TAKE 2 OF YOUR 24/26 MG TABS Twice daily FOR NOW, IF YOU TOLERATE THIS DOSE PLEASE LET us KNOW AND WE CAN SEND YOU IN A NEW PRESCRIPTION  Lab Work:  Labs done today, your results will be available in MyChart, we will contact you for abnormal readings.  Testing/Procedures:  none  Referrals:  You have been referred to follow back up with Dr Renne Crigler at [facility], they will call you for an appointment  Special Instructions // Education:  Do the following things EVERYDAY: Weigh yourself in the morning before breakfast. Write it down and keep it in a log. Take your medicines as prescribed Eat low salt foods--Limit salt (sodium) to 2000 mg per day.  Stay as active as you can everyday Limit all fluids for the day to less than 2 liters   Follow-Up in: 4 months  At the [facility], you and your health needs are our priority. We have a designated team specialized in the treatment of Heart Failure. This Care Team includes your primary Heart Failure Specialized Cardiologist (physician), Advanced Practice Providers (APPs- Physician Assistants and Nurse Practitioners), and Pharmacist who all work together to provide you with the care you need, when you need it.   You may see any of the following providers on your designated Care Team at your next follow up:  Dr. Glori Bickers Dr. Loralie Champagne Dr. Roxana Hires, NP Lyda Jester, [place] [facility] [place], [place] Forestine Na, NP Audry Riles, PharmD   Please be sure to bring in all your medications bottles to every appointment.   Need to Contact us:  If you have any questions or concerns before your next appointment please send [place] a message through [place] or call our office at [phone].    TO LEAVE A MESSAGE FOR THE NURSE SELECT OPTION 2, PLEASE LEAVE A MESSAGE INCLUDING: YOUR NAME DATE OF BIRTH CALL BACK  NUMBER REASON FOR CALL**this is important as we prioritize the call backs  YOU WILL RECEIVE A CALL BACK THE SAME DAY AS LONG AS YOU CALL BEFORE 4:00 PM

## 2022-03-28 ENCOUNTER — Other Ambulatory Visit: Payer: Self-pay | Admitting: Nurse Practitioner

## 2022-03-28 DIAGNOSIS — E220 Acromegaly and pituitary gigantism: Secondary | ICD-10-CM

## 2022-03-30 ENCOUNTER — Other Ambulatory Visit (HOSPITAL_COMMUNITY): Payer: Self-pay | Admitting: Internal Medicine

## 2022-03-31 IMAGING — MR MR CARD MORPHOLOGY WO/W CM
46 of 48 series · 46 of 48 positions shown · IV contrast (gadavist)
Comparison: none

CLINICAL DATA: Cardiomyopathy of uncertain etiology

EXAM:
CARDIAC MRI
TECHNIQUE: The patient was scanned on a 1.5 Tesla GE magnet. A dedicated
cardiac coil was used. Functional imaging was done using Fiesta
sequences. [DATE], and 4 chamber views were done to assess for RWMA's.
Modified Gioia rule using a short axis stack was used to
calculate an ejection fraction on a dedicated work station using
Circle software. The patient received 10 cc of Gadavist. After 10
minutes inversion recovery sequences were used to assess for
infiltration and scar tissue.
CONTRAST:  Gadavist 10 cc

[Series 7: bSSFP · oblique · 8.0mm · 1.61mm/px · 1 of 15 slices shown (1 of 21)]
[im 1/15]
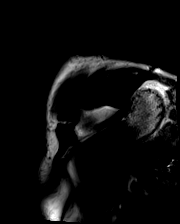

[Series 7: bSSFP · oblique · 8.0mm · 1.61mm/px · 1 of 15 slices shown (2 of 21)]
[im 1/15]
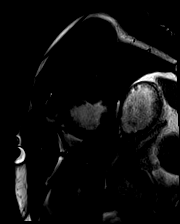

[Series 7: bSSFP · oblique · 8.0mm · 1.61mm/px · 1 of 15 slices shown (3 of 21)]
[im 1/15]
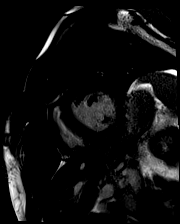

[Series 7: bSSFP · oblique · 8.0mm · 1.61mm/px · 1 of 15 slices shown (4 of 21)]
[im 1/15]
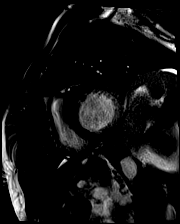

[Series 7: bSSFP · oblique · 8.0mm · 1.61mm/px · 1 of 15 slices shown (5 of 21)]
[im 1/15]
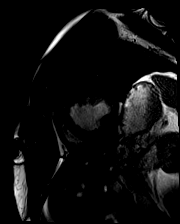

[Series 7: bSSFP · oblique · 8.0mm · 1.61mm/px · 1 of 15 slices shown (6 of 21)]
[im 1/15]
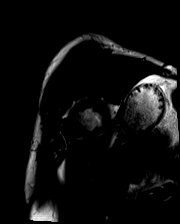

[Series 7: bSSFP · oblique · 8.0mm · 1.61mm/px · 1 of 15 slices shown (7 of 21)]
[im 1/15]
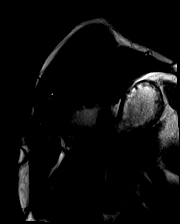

[Series 7: bSSFP · oblique · 8.0mm · 1.61mm/px · 1 of 15 slices shown (8 of 21)]
[im 1/15]
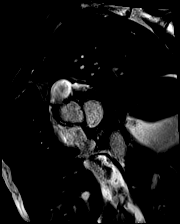

[Series 7: bSSFP · oblique · 8.0mm · 1.61mm/px · 1 of 15 slices shown (9 of 21)]
[im 1/15]
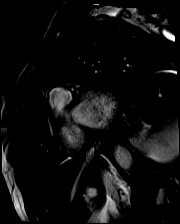

[Series 7: bSSFP · oblique · 8.0mm · 1.61mm/px · 1 of 15 slices shown (10 of 21)]
[im 1/15]
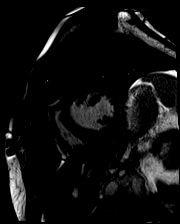

[Series 7: bSSFP · oblique · 8.0mm · 1.61mm/px · 1 of 15 slices shown (11 of 21)]
[im 1/15]
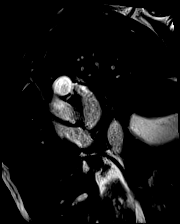

[Series 7: bSSFP · oblique · 8.0mm · 1.61mm/px · 1 of 15 slices shown (12 of 21)]
[im 1/15]
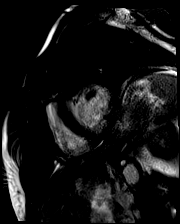

[Series 7: bSSFP · oblique · 8.0mm · 1.61mm/px · 1 of 15 slices shown (13 of 21)]
[im 1/15]
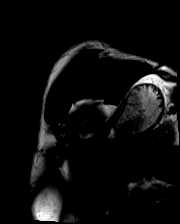

[Series 7: bSSFP · oblique · 8.0mm · 1.61mm/px · 1 of 15 slices shown (14 of 21)]
[im 1/15]
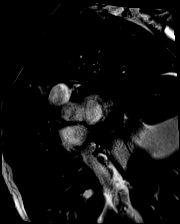

[Series 7: bSSFP · oblique · 8.0mm · 1.61mm/px · 1 of 15 slices shown (15 of 21)]
[im 1/15]
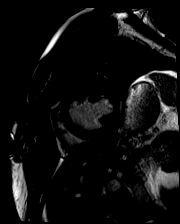

[Series 7: bSSFP · oblique · 8.0mm · 1.61mm/px · 1 of 15 slices shown (16 of 21)]
[im 1/15]
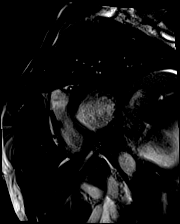

[Series 7: bSSFP · oblique · 8.0mm · 1.61mm/px · 1 of 15 slices shown (17 of 21)]
[im 1/15]
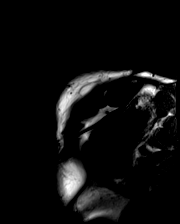

[Series 7: bSSFP · oblique · 8.0mm · 1.61mm/px · 1 of 15 slices shown (18 of 21)]
[im 1/15]
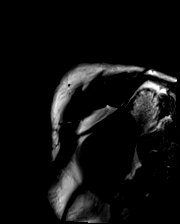

[Series 7: bSSFP · oblique · 8.0mm · 1.61mm/px · 1 of 15 slices shown (19 of 21)]
[im 1/15]
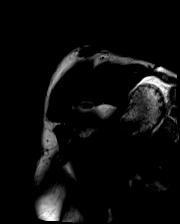

[Series 7: bSSFP · oblique · 8.0mm · 1.61mm/px · 1 of 15 slices shown (20 of 21)]
[im 1/15]
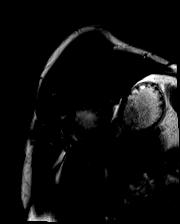

[Series 7: bSSFP · oblique · 8.0mm · 1.61mm/px · 1 of 15 slices shown (21 of 21)]
[im 1/15]
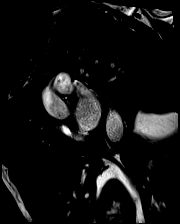

[Series 8: cine_trufi_cs_rt_short axis · oblique · 8.0mm · 1.73mm/px · 1 of 49 slices shown (1 of 21)]
[im 1/49]
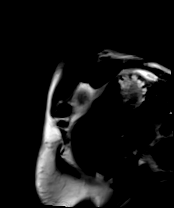

[Series 8: cine_trufi_cs_rt_short axis · oblique · 8.0mm · 1.73mm/px · 1 of 49 slices shown (2 of 21)]
[im 1/49]
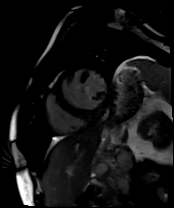

[Series 8: cine_trufi_cs_rt_short axis · oblique · 8.0mm · 1.73mm/px · 1 of 49 slices shown (3 of 21)]
[im 1/49]
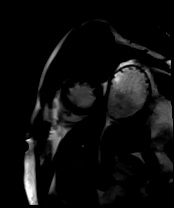

[Series 8: cine_trufi_cs_rt_short axis · oblique · 8.0mm · 1.73mm/px · 1 of 49 slices shown (4 of 21)]
[im 1/49]
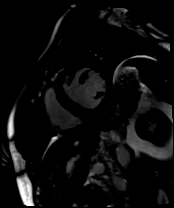

[Series 8: cine_trufi_cs_rt_short axis · oblique · 8.0mm · 1.73mm/px · 1 of 49 slices shown (5 of 21)]
[im 1/49]
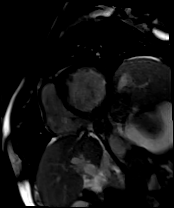

[Series 8: cine_trufi_cs_rt_short axis · oblique · 8.0mm · 1.73mm/px · 1 of 49 slices shown (6 of 21)]
[im 1/49]
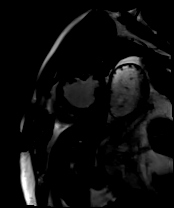

[Series 8: cine_trufi_cs_rt_short axis · oblique · 8.0mm · 1.73mm/px · 1 of 49 slices shown (7 of 21)]
[im 1/49]
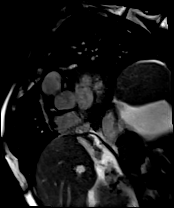

[Series 8: cine_trufi_cs_rt_short axis · oblique · 8.0mm · 1.73mm/px · 1 of 49 slices shown (8 of 21)]
[im 1/49]
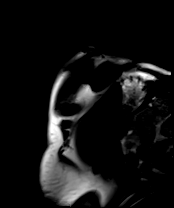

[Series 8: cine_trufi_cs_rt_short axis · oblique · 8.0mm · 1.73mm/px · 1 of 49 slices shown (9 of 21)]
[im 1/49]
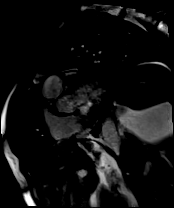

[Series 8: cine_trufi_cs_rt_short axis · oblique · 8.0mm · 1.73mm/px · 1 of 49 slices shown (10 of 21)]
[im 1/49]
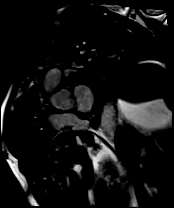

[Series 8: cine_trufi_cs_rt_short axis · oblique · 8.0mm · 1.73mm/px · 1 of 49 slices shown (11 of 21)]
[im 1/49]
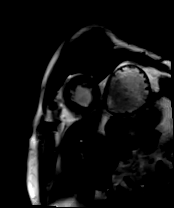

[Series 8: cine_trufi_cs_rt_short axis · oblique · 8.0mm · 1.73mm/px · 1 of 49 slices shown (12 of 21)]
[im 1/49]
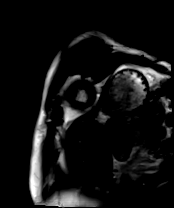

[Series 8: cine_trufi_cs_rt_short axis · oblique · 8.0mm · 1.73mm/px · 1 of 49 slices shown (13 of 21)]
[im 1/49]
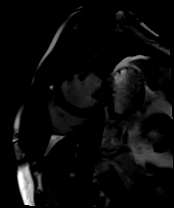

[Series 8: cine_trufi_cs_rt_short axis · oblique · 8.0mm · 1.73mm/px · 1 of 49 slices shown (14 of 21)]
[im 1/49]
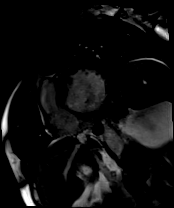

[Series 8: cine_trufi_cs_rt_short axis · oblique · 8.0mm · 1.73mm/px · 1 of 49 slices shown (15 of 21)]
[im 1/49]
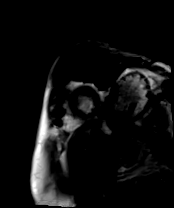

[Series 8: cine_trufi_cs_rt_short axis · oblique · 8.0mm · 1.73mm/px · 1 of 49 slices shown (16 of 21)]
[im 1/49]
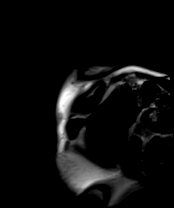

[Series 8: cine_trufi_cs_rt_short axis · oblique · 8.0mm · 1.73mm/px · 1 of 49 slices shown (17 of 21)]
[im 1/49]
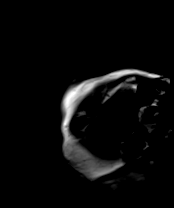

[Series 8: cine_trufi_cs_rt_short axis · oblique · 8.0mm · 1.73mm/px · 1 of 49 slices shown (18 of 21)]
[im 1/49]
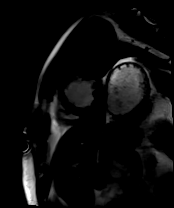

[Series 8: cine_trufi_cs_rt_short axis · oblique · 8.0mm · 1.73mm/px · 1 of 49 slices shown (19 of 21)]
[im 1/49]
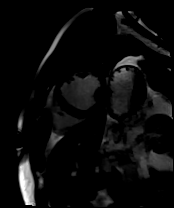

[Series 8: cine_trufi_cs_rt_short axis · oblique · 8.0mm · 1.73mm/px · 1 of 49 slices shown (20 of 21)]
[im 1/49]
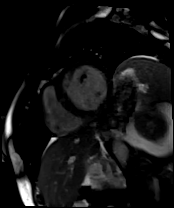

[Series 8: cine_trufi_cs_rt_short axis · oblique · 8.0mm · 1.73mm/px · 1 of 49 slices shown (21 of 21)]
[im 1/49]
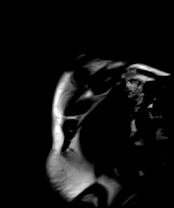

[Series 9: cine_trufi_cs_rt_radial · sagittal · 6.0mm · 1.73mm/px · 1 of 43 slices shown (1 of 3)]
[im 1/43]
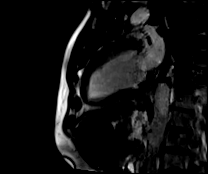

[Series 10: cine_trufi_cs_rt_radial · axial · 6.0mm · 1.73mm/px · 1 of 43 slices shown (2 of 3)]
[im 1/43]
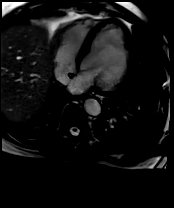

[Series 11: cine_trufi_cs_rt_radial · axial · 6.0mm · 1.73mm/px · 1 of 43 slices shown (3 of 3)]
[im 1/43]
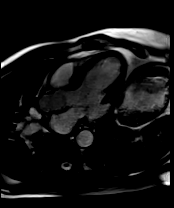

[Series 12: (id)_long_t1 · oblique · 8.0mm · 1.41mm/px · 1 of 24 slices shown]
[im 1/24]
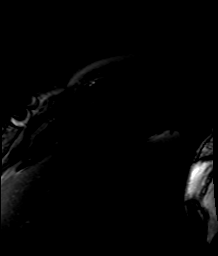

[46 of 48 positions shown; findings below may reference images not displayed]

FINDINGS: Technically difficult study due to respiratory artifact and ectopy.

Limited images of the lung fields showed no gross abnormalities.

Moderately dilated left ventricle with normal wall thickness.
Diffuse hypokinesis with basal to mid inferolateral akinesis. EF
estimated 20-25%. There were frequent PVCs and respiratory artifact,
it was not possible to quantify the ejection fraction. Normal right
ventricular size and systolic function. Trileaflet aortic valve with
no stenosis, possible trivial regurgitation. No significant mitral
regurgitation.

Delayed enhancement images were difficult due to artifact. However,
there was definitely a dense scar in the basal to mid inferolateral
wall. There was 76-99% wall thickness subendocardial late gadolinium
enhancement involving the basal to mid inferolateral wall.
IMPRESSION: 1. Very difficult study due to frequent ectopy and respiratory
artifact.

2. Moderately dilated left ventricle with estimated EF 20-25%
(unable to quantify due to respiratory artifact and ectopy). Diffuse
hypokinesis with basal to mid inferolateral akinesis.

3.  Normal RV size and systolic function.

4. Delayed enhancement images were also very difficult. However,
there was clearly a dense basal to mid inferolateral scar. This
looked most consistent with a prior MI in the LCx territory.

[place]

## 2022-04-26 ENCOUNTER — Ambulatory Visit (INDEPENDENT_AMBULATORY_CARE_PROVIDER_SITE_OTHER): Payer: PRIVATE HEALTH INSURANCE | Admitting: Internal Medicine

## 2022-04-26 ENCOUNTER — Encounter: Payer: Self-pay | Admitting: Internal Medicine

## 2022-04-26 VITALS — BP 130/80 | HR 81 | Ht 76.0 in | Wt 255.8 lb

## 2022-04-26 DIAGNOSIS — E0590 Thyrotoxicosis, unspecified without thyrotoxic crisis or storm: Secondary | ICD-10-CM | POA: Diagnosis not present

## 2022-04-26 DIAGNOSIS — D352 Benign neoplasm of pituitary gland: Secondary | ICD-10-CM

## 2022-04-26 DIAGNOSIS — E220 Acromegaly and pituitary gigantism: Secondary | ICD-10-CM

## 2022-04-26 LAB — CORTISOL: Cortisol, Plasma: 11.5 ug/dL

## 2022-04-26 LAB — T4, FREE: Free T4: 1.76 ng/dL — ABNORMAL HIGH (ref 0.60–1.60)

## 2022-04-26 LAB — TSH: TSH: 0.48 u[IU]/mL (ref 0.35–5.50)

## 2022-04-26 LAB — T3, FREE: T3, Free: 4.5 pg/mL — ABNORMAL HIGH (ref 2.3–4.2)

## 2022-04-26 NOTE — Patient Instructions (Addendum)
Please stop at the lab.  Please schedule a new appt. With PCP for diabetes control.  Please come back for a follow-up appointment in 4 months.

## 2022-04-26 NOTE — Progress Notes (Addendum)
Patient ID: Kevin Hampton, male   DOB: February 25, 1966, 56 y.o.   MRN: [id number]   HPI  Kevin Hampton is a 56 y.o.-year-old male, initially referred by his cardiologist, [name], returning for follow-up for acromegaly and thyrotoxicosis.  Last visit 1 year and 7 months ago.  Interim history: At our last visit, after his transsphenoidal surgery, he was feeling well: joint pain, reflux, constipation resolved.  At today's visit, he has shoulder and knee pain.  He continues to heat intolerance (chronic).  Weight is stable.   His diabetes became very uncontrolled.  Insulin was added over the summer, but he ran out and did not go back to see PCP for this. He still has very little smell and taste. Wife passed away at the end on last year (05/2021).  He moved to [place], [place], 3 hours away.  Patient with history of acromegaly, suspected in 11/2019 based on elevated growth hormone and pituitary adenoma diagnosed in 12/2019 on MRI.  Reviewed history: Pt describes that in late 10s, he noticed a change in his appearance: his jaw and forehead started to protrude.  He also has large fingers but he was quite obese and lost approximately 100 pounds approximately 10 to 15 years ago so he thought this was the cause why his fingers were larger.  He also had problems with increased spacing between his lower incisors. His son noticed a change in appearance and advised the patient to have his growth hormone checked.  Pt. was seen by [name] in 10/2019 and a growth hormone was checked at that time.  This returned elevated, so they pituitary MRI was ordered, but this was not done before last visit.  She was referred to endocrinology for investigation for acromegaly.  At our first visit, he described: - + increase in ring size - + widening of spaces between teeth - + developing of an underbite - + deepening of the voice - no increase in shoe size - no headaches - + Increase  sweating - no joint pain, only occasionally in fingers - + occasional constipation - + now only rarely snoring, but had OSA before losing weight - repeated sleep study >> normal - + weight loss - + fatigue, low energy - + cold intolerance only at night, occasionally - no vision loss, no double vision, he prev. wore readers  -not since 03/2019  He also has: - cardiomegaly with cardiomyopathy and CHF (EF 20 to 25%, per review of his most recent cardiac MRI report) - diabetes type 2 (dx'ed 2012), uncontrolled  He had transsphenoidal surgery for acromegaly with Dr. Marcello Moores on 05/26/2020. This was originally planned for 04/15/2020, but he had COVID-19 at that time and the surgery was postponed.  He felt well after the surgery, with no complaints. He did not develop DI or SIADH after the surgery (reviewed sodium levels and these were normal). Also, there is no significant congestion. However, he does report loss of taste and smell, initially with his COVID-19 infection, after which he started to recover, but lost these again after the surgery.  He had steroids after the treatment, hydrocortisone, 2 tablets in a.m. and 1 in p.m., stopped 3 weeks after surgery.  Unfortunately, the pathology of his pituitary tumor was very minimalistic (there was no staining for Ki67 and somatostatin receptors and no mention of granulation status): FINAL MICROSCOPIC DIAGNOSIS:   A. PITUITARY GLAND, TUMOR, RESECTION:  -  Pituitary adenoma   GROSS DESCRIPTION:  Received fresh are fragments of soft tan-pink tissue measuring 1.8 x 1.7  x 0.3 cm in aggregate.  The specimen is submitted in toto.  (GRP 05/26/2020)   I requested stainings for Ki-67, somatostatin receptors, growth, granularity and prolactin, but only the Ki-67 could be run by the pathology department.  This was <1%.  He saw ENT - Dr Wilburn Cornelia (note reviewed from 06/22/2020).  Reviewed pertinent labs: He had high LH, FSH, ACTH, with normal cortisol and  testosterone. He had very high IGF-I and growth hormone. we did not perform a growth hormone suppression test due to the high suspicion for acromegaly: Component     Latest Ref Rng & Units 11/24/2019 (preop)  IGF-I, LC/MS     50 - 317 ng/mL 1,282 (H)  Z-Score (Male)     -2.0 - 2 SD 6.0 (H)  TSH     0.35 - 4.50 uIU/mL 0.84  T4,Free(Direct)     0.60 - 1.60 ng/dL 2.00 (H)  Triiodothyronine,Free,Serum     2.3 - 4.2 pg/mL 4.3 (H)  Growth Hormone     < OR = 7.1 ng/mL 48.9 (H)  LH     1.50 - 9.30 mIU/mL 13.07 (H)  FSH     1.4 - 18.1 mIU/ML 53.9 (H)  Prolactin     2.0 - 18.0 ng/mL 14.3  Cortisol, Plasma     ug/dL 13.6  C206 ACTH     6 - 50 pg/mL 60 (H)  Testosterone, total     264.0 - 916.0 ng/dL 341.9  10/28/2019: Growth hormone 27.9 (0-10)  Component     Latest Ref Rng & Units 06/24/2020 (postop)          Sodium     135 - 145 mEq/L 138  Potassium     3.5 - 5.1 mEq/L 5.3 (H)  Chloride     96 - 112 mEq/L 100  CO2     19 - 32 mEq/L 30  Glucose     70 - 99 mg/dL 219 (H)  BUN     6 - 23 mg/dL 18  Creatinine     0.40 - 1.50 mg/dL 1.17  GFR     >60.00 mL/min 69.86  Calcium     8.4 - 10.5 mg/dL 10.6 (H)  IGF-I, LC/MS     50 - 317 ng/mL 1,094 (H)  Z-Score (Male)     -2.0 - 2 SD 5.5 (H)  Extra tube recieved        Specimen type recieved      Serum;  TSH     0.35 - 4.50 uIU/mL 0.16 (L)  T4,Free(Direct)     0.60 - 1.60 ng/dL 2.26 (H)  Triiodothyronine,Free,Serum     2.3 - 4.2 pg/mL 4.7 (H)  Growth Hormone     < OR = 7.1 ng/mL 7.0  Cortisol, Plasma     ug/dL 11.3  C206 ACTH     6 - 50 pg/mL 50   At last visit, IGF-I was still elevated, only slightly decreased, GH was normal: Component     Latest Ref Rng & Units 09/23/2020  IGF-I, LC/MS     50 - 317 ng/mL 882 (H)  Z-Score (Male)     -2.0 - 2 SD 4.8 (H)  Glucose     65 - 99 mg/dL 146 (H)  TSH     0.35 - 4.50 uIU/mL 0.47  T4,Free(Direct)     0.60 - 1.60 ng/dL 1.89 (H)  Triiodothyronine,Free,Serum     2.3 -  4.2 pg/mL 4.5 (H)  Growth Hormone     < OR = 7.1 ng/mL 5.6  TSI     <140 % baseline <89   Pituitary MRI (01/09/2020) showed a macroadenoma: 2.5 cm macroadenoma with suprasellar extension and abutment of the inferior optic chiasm. Mild splaying of the pre-chiasmatic optic nerves.   Sella floor defect with extension of tumor into the right sphenoid sinus.   Bilateral cavernous sinus involvement without significant encasement or luminal narrowing of the intracavernous ICAs.   Nonspecific supratentorial white matter T2 hyperintensities. Differential includes minimal chronic microvascular ischemic changes versus post infectious/inflammatory sequela.   Osseous prominence of the bilateral supraorbital margin, anterior maxillary walls and mandibular condyles likely reflects sequela of acromegaly.      Pituitary MRI (08/03/2020): 1. Postsurgical changes from transsphenoidal pituitary tumor resection with significant interval decrease in size of the pituitary tumor. A hypoenhancing component on the left side of the sella measuring approximately 11 x 10 x 5 mm may represent residual tumor. 2. There is a nonenhancing cystic like component on the right side of the sella measuring approximately 1.5 x 0.8 x 0.8 cm with low signal on T2, likely related to blood products. 3. Persistent masslike structure along the sphenoid septum suggestive of inferior extension of pituitary tumor.  Thyrotoxicosis: He had a low TSH with high free thyroid hormones previously: Lab Results  Component Value Date   TSH 0.414 10/11/2021   TSH 0.47 09/23/2020   TSH 0.16 (L) 06/24/2020   TSH 0.84 11/24/2019   TSH 0.955 10/28/2019   TSH 0.845 05/01/2019   TSH 0.789 12/15/2018   FREET4 1.92 (H) 10/11/2021   FREET4 1.89 (H) 09/23/2020   FREET4 2.26 (H) 06/24/2020   FREET4 2.00 (H) 11/24/2019   FREET4 2.26 (H) 10/28/2019   FREET4 1.60 (H) 05/01/2019   Lab Results  Component Value Date   T3FREE 4.2  10/11/2021   T3FREE 4.5 (H) 09/23/2020   T3FREE 4.7 (H) 06/24/2020   T3FREE 4.3 (H) 11/24/2019   T3FREE 7.1 (H) 10/28/2019   T3FREE 3.2 05/01/2019  He is on amiodarone.  Graves' antibodies are not elevated: Lab Results  Component Value Date   TSI <89 09/23/2020   We started methimazole 5 mg daily and advised him to come back for labs in 1 month.  He did not return...  Hypercalcemia: He had occasional slightly high calcium levels, now normalized: Lab Results  Component Value Date   CALCIUM 9.1 02/16/2022   CALCIUM 9.6 10/11/2021   CALCIUM 10.2 02/21/2021   CALCIUM 10.2 11/19/2020   CALCIUM 9.8 09/06/2020   CALCIUM 10.2 08/18/2020   CALCIUM 10.6 (H) 06/24/2020   CALCIUM 9.0 05/27/2020   CALCIUM 10.2 05/20/2020   CALCIUM 9.9 04/06/2020   CALCIUM 10.2 02/18/2020   CALCIUM 10.2 11/18/2019   CALCIUM 10.5 (H) 10/28/2019   CALCIUM 10.1 09/03/2019   CALCIUM 9.8 05/01/2019   CALCIUM 9.4 04/03/2019   CALCIUM 9.7 02/10/2019   CALCIUM 10.3 (H) 02/03/2019   CALCIUM 9.7 12/27/2018   CALCIUM 9.0 12/17/2018    Lab Results  Component Value Date   MG 1.9 05/01/2019   MG 1.7 12/27/2018   MG 1.7 12/16/2018   MG 1.4 (L) 12/15/2018   No results found for: "VD25OH"   No previous colonoscopies.  He has a history of nasal polyp surgeries and mentions that he has a lot of scar tissue in his nose.  He lost a significant amount of weight after he moved from [place] [employer] to [place].  He attributed this to his diabetes. This was not unintentional.  No FH of DM, pituitary tumors.   He is a Theme park manager.  Uncontrolled DM2: - diagnosed in 2013 - managed by his PCP  Reviewed his HbA1c levels: Lab Results  Component Value Date   HGBA1C >15 02/08/2022   HGBA1C >15 12/07/2021   HGBA1C 7.1 (A) 02/21/2021   HGBA1C 7.1 (A) 11/19/2020   HGBA1C 8.1 (A) 08/18/2020   HGBA1C 9.0 (H) 05/20/2020   HGBA1C 10.4 (A) 03/01/2020   HGBA1C 11.8 (A) 12/05/2019   HGBA1C 8.7 (A) 09/10/2019   HGBA1C 10.0  (A) 03/17/2019   HGBA1C 12.7 (H) 12/15/2018   At our last visit he was on: - Metformin 1000 mg 2x a day - Glipizide 10 mg 2x a day - Farxiga 10 mg before breakfast She refused injectable medications in the past.  At that time, he declined is working together in diabetes management, as he wanted to return to see his PCP for this problem.  He was on insulin but came off - ran out... - Lantus 22 units daily He came off Metformin b/c poss. SEs... He came off [name]...  Only on Glipizide now.  No FH of DM2.  ROS: + see HPI + subjective hyperthermia  I reviewed pt's medications, allergies, PMH, social hx, family hx, and changes were documented in the history of present illness. Otherwise, unchanged from my initial visit note.  Past Medical History:  Diagnosis Date   Acromegaly ([place])    CHF (congestive heart failure) ([place])    Essential hypertension    Hypertensive emergency 12/15/2018   Nonischemic cardiomyopathy ([place])    Obesity    Pituitary macroadenoma ([place])    Systolic heart failure ([place])    Type 2 diabetes mellitus with complication [facility])    Past Surgical History:  Procedure Laterality Date   CRANIOTOMY N/A 05/26/2020   Procedure: ENDOSCOPIC TRANSPHENOIDAL RESECTION OF TUMOR;  Surgeon: Vallarie Mare, MD;  Location: [place];  Service: Neurosurgery;  Laterality: N/A;   RIGHT/LEFT HEART CATH AND CORONARY ANGIOGRAPHY N/A 12/16/2018   Procedure: RIGHT/LEFT HEART CATH AND CORONARY ANGIOGRAPHY;  Surgeon: Lorretta Harp, MD;  Location: [place] [facility];  Service: Cardiovascular;  Laterality: N/A;   TRANSPHENOIDAL APPROACH EXPOSURE N/A 05/26/2020   Procedure: TRANSPHENOIDAL APPROACH EXPOSURE;  Surgeon: Jerrell Belfast, MD;  Location: [facility] OR;  Service: ENT;  Laterality: N/A;   Social History   Socioeconomic History   Marital status: Widowed    Spouse name: Everlene Farrier   Number of children: 1   Years of education: Not on file   Highest education level: Not on file   Occupational History   Occupation: Minister/carpenter/musician  Tobacco Use   Smoking status: Former   Smokeless tobacco: Never  Scientific laboratory technician Use: Never used  Substance and Sexual Activity   Alcohol use: Never   Drug use: Never   Sexual activity: Yes  Other Topics Concern   Not on file  Social History Narrative   Not on file   Social Determinants of Health   Financial Resource Strain: Not on file  Food Insecurity: No Food Insecurity (02/16/2022)   Hunger Vital Sign    Worried About Running Out of Food in the Last Year: Never true    Ran Out of Food in the Last Year: Never true  Transportation Needs: No Transportation Needs (02/16/2022)   PRAPARE - Hydrologist (Medical): No    Lack of Transportation (Non-Medical):  No  Physical Activity: Not on file  Stress: Not on file  Social Connections: Not on file  Intimate Partner Violence: Not on file   Current Outpatient Medications on File Prior to Visit  Medication Sig Dispense Refill   amiodarone (PACERONE) 200 MG tablet Take 1 tablet by mouth once daily 90 tablet 0   glipiZIDE (GLUCOTROL) 10 MG tablet Take 1 tablet (10 mg total) by mouth 2 (two) times daily before a meal. 180 tablet 1   insulin glargine (LANTUS SOLOSTAR) 100 UNIT/ML Solostar Pen Inject 22 Units into the skin daily. 15 mL PRN   Insulin Pen Needle (PEN NEEDLES) 31G X 5 MM MISC 1 each by Does not apply route daily. 100 each 11   sacubitril-valsartan (ENTRESTO) 24-26 MG Take 1 tablet by mouth 2 (two) times daily. 180 tablet 1   spironolactone (ALDACTONE) 25 MG tablet Take 1 tablet by mouth once daily 90 tablet 0   No current facility-administered medications on file prior to visit.   Allergies  Allergen Reactions   Coreg [Carvedilol] Other (See Comments)    Brings heart rate and blood pressure down severly   [name] [Dapagliflozin] Nausea Only   Family History  Problem Relation Age of Onset   Hypertension Mother    Heart  attack Mother    Hypertension Father    Hypertension Sister    Hypertension Brother    PE: BP 130/80 (BP Location: Right Arm, Patient Position: Sitting, Cuff Size: Normal)   Pulse 81   Ht _0  (1.93 m)   Wt 255 lb 12.8 oz (116 kg)   SpO2 98%   BMI 31.14 kg/m  Wt Readings from Last 3 Encounters:  04/26/22 255 lb 12.8 oz (116 kg)  02/16/22 256 lb 12.8 oz (116.5 kg)  02/08/22 254 lb 9.6 oz (115.5 kg)   Constitutional: overweight, in NAD, + deep voice, + frontal bossing, + enlarged jaw with widely spaced incisors, + enlarged lower lip, + prominent nasolabial folds Eyes: EOMI, no exophthalmos ENT:  no thyromegaly, no cervical lymphadenopathy Cardiovascular: RRR, No MRG Respiratory: CTA B Musculoskeletal: + deformities (large jaw, fingers, forehead) Skin: no rashes, + skin tags on neck Neurological: no tremor with outstretched hands  ASSESSMENT: 1.  Acromegaly  2.  Pituitary macroadenoma  3.  Thyrotoxicosis  4.  Hypercalcemia  5.  Uncontrolled type 2 diabetes  PLAN:  1.and 2.   -Patient with approximately 20-year history of facial feature changes and traumatic change in appearance over the years, per review of pictures brought by patient.  He was found to have a high growth hormone by his cardiologist, during investigation for cardiomegaly.  At our first visit, we checked an IGF-I along with growth hormone and these were very high.  He also had high LH, FSH, and ACTH.  A pituitary MRI (01/09/2020) showed a macroadenoma measuring 2.5 cm, with suprasellar extension and abutting the inferior optic chiasm.  Also, the tumor eroded the sella floor and was extending in the right sphenoid sinus.  He was referred to neurosurgery and had transsphenoidal surgery on 05/27/2020.  He tolerated the surgery well and did not develop DI or SIADH after the surgery.  Also, no increased congestion or headaches.  We reviewed together the pathology of the tumor: The tumor measures 1.8 cm in the largest  dimension and it was an adenoma, however, no other pathology information was offered.  Before this visit, I discussed with Dr. Jethro Bolus, pathologist, and requested staining for growth hormone granularity, prolactin,  somatostatin receptors, and Ki-67 to help with further treatment planning.  Only the Ki-67 was able to be obtained and this was low, less than 1%.  At today's visit, I discussed with the patient that this is indicative of a slow-growing tumor, a grade 1 NET, which is excellent. -Reviewed the postoperative visitation: A growth hormone was not checked postoperatively in the hospital, but ideally this would have been lower than 2 ng/mL, fasting.  We checked this at last visit, and a growth hormone was 7 ng/mL (despite a glucose 219) and IGF-I was still very high at 1094 ng/mL.  At last visit, IGF-I was still high, at 882.we will repeat this today.  I will also recheck his growth hormone (we may need an OGTT if glucose is normal and GH is not suppressed - ideally GH post 75 mg glucose solution is  <1, or, better, <0.4 for admission).  He has persistent tumor, evident on his most recent pituitary MRI from 08/03/2020 showing left sellar mass measuring 1.1 cm in the largest dimension and also a persistent masslike structure in the sphenoid sinus. -We discussed about repeating another MRI now - ordered -We discussed about the need for adjuvant therapy and I explained that there are several options: Somatostatin analogs -I would suggest either somatostatin LAR (im) on, preferably, lanreotide (sq), which are injected monthly.  However, these are very expensive and, if not able to get them covered by the insurance, I may need to refer him to an academic center.  He now lives in [place] about 3 hours away, so it would be challenging to come to the clinic to get the injections.  However, he tells me that he would prefer to do so, if they are affordable.  I suggested [facility] clinic, but he is even  further from them, approximately 5 hours away. Cabergoline-usually as an additive to the somatostatin analogs.  The rationale is that some acromegalic tumors may co-secrete prolactin, so may be responsive to the dopamine agonists Somatostatin receptor blocker - Pegvisomant (not first line).  This is currently a daily injection but weekly injections can be used especially when using combination with somatostatin analogs.  It is also very expensive and usually treatment is long-term. Repeat surgery -however, this is unlikely to be successful especially with tumor extending outside the sella turcica, due to inaccessibility Gamma knife surgery -we may need to proceed with this as a last resort, due to cost and possible complications including hypopituitarism -His ACTH and cortisol levels normalized at last check in 2022 >> we will repeat these today -His sodium level was recently slightly low, at 133, in 02/2022, but this was likely due to dilutional hyponatremia in the setting of a glucose of 183 -I will see him back in 4 months  3.  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  Hypercalcemia -He had an occasional slightly high calcium in the past and also around the time of his surgery - this is occasionally seen in the setting of acromegaly either  due to high PTH or high calcitriol levels. -He had repeat calcium levels repeatedly since then and they were normal -No further investigation is needed for now  5.  Uncontrolled  diabetes -Significant worsening in diabetes control since last visit.  Latest HbA1c levels have been undetectably high.  He mentions he came off metformin due to a neighbor going to dialysis after using metformin.  I advised him that it is likely that this happened due to the diabetes itself, not metformin.  However, he is adamant that he would not want to restart this.  He also came off [name] and continues now only on glipizide.  He was started on insulin by PCP but he ran out and did not refill it. -He continues  to prefer to see his PCP for diabetes.  I discussed with him that it is very dangerous to ignore it due to possible complications.  We discussed about the possibility of cardiovascular complications like strokes and heart attacks.  He already has cardiovascular disease and is preparing to have a pacemaker placed in 06/2022. -I strongly advised him to schedule an appointment with PCP as soon as possible and resume his insulin  Orders Placed This Encounter  Procedures   MR Brain W Wo Contrast   TSH   T4, free   T3, free   Cortisol   ACTH   Insulin-like growth factor   Growth hormone   Prolactin   - Total time spent for the visit: 40 min, in precharting, reviewing pt's chart, reviewing his  previous labs, evaluations, and treatments, reviewing his symptoms, discussing about medical problems since last visit, counseling him about his pituitary macroadenoma, acromegaly, thyroid disease, diabetes (please see the discussed topics above), and developing a plan to further educate and treat them; he had a number of questions which I addressed.  Component     Latest Ref Rng 04/26/2022  TSH     0.35 - 5.50 uIU/mL 0.48   T4,Free(Direct)     0.60 - 1.60 ng/dL 1.76 (H)   Triiodothyronine,Free,Serum     2.3 - 4.2 pg/mL 4.5 (H)   Growth Hormone     < OR = 7.1 ng/mL 12.2 (H)   Cortisol, Plasma     ug/dL 11.5   IGF-I, LC/MS     50 - 317 ng/mL 942 (H)   Z-Score (Male)     -2.0 - 2.0 SD 5.1  (H)   Prolactin     2.0 - 18.0 ng/mL 8.2   C206 ACTH     6 - 50 pg/mL 34    ACTH and cortisol levels are normal.  IGF1 is elevated,  Higher than before, c/w persistent disease. FT4 and FT3 elevated (but improving) with nonsuppressed TSH.  At this point, I am wondering if his tumor co-secretes TSH, which would be unusual, but not impossible. It is difficult to know w/o the proper staining.  If this is the case, treatment is the same for both acromegaly and Thyrotropinoma: Somatostatin analogs.  I will wait for the results of his new MRI and then see if we can start him on lanreotide -we can 90 mg daily and titrate based on response.  Philemon Kingdom, MD PhD [facility] Endocrinology

## 2022-04-30 LAB — GROWTH HORMONE: Growth Hormone: 12.2 ng/mL — ABNORMAL HIGH (ref <=7.1)

## 2022-04-30 LAB — ACTH: C206 ACTH: 34 pg/mL (ref 6–50)

## 2022-04-30 LAB — PROLACTIN: Prolactin: 8.2 ng/mL (ref 2.0–18.0)

## 2022-04-30 LAB — INSULIN-LIKE GROWTH FACTOR
IGF-I, LC/MS: 942 ng/mL — ABNORMAL HIGH (ref 50–317)
Z-Score (Male): 5.1 SD — ABNORMAL HIGH (ref ?–2.0)

## 2022-05-25 ENCOUNTER — Other Ambulatory Visit (HOSPITAL_COMMUNITY): Payer: Self-pay | Admitting: Internal Medicine

## 2022-05-25 DIAGNOSIS — I10 Essential (primary) hypertension: Secondary | ICD-10-CM

## 2022-05-27 ENCOUNTER — Other Ambulatory Visit (HOSPITAL_COMMUNITY): Payer: Self-pay | Admitting: Internal Medicine

## 2022-05-27 DIAGNOSIS — I10 Essential (primary) hypertension: Secondary | ICD-10-CM

## 2022-05-29 ENCOUNTER — Other Ambulatory Visit (HOSPITAL_COMMUNITY): Payer: Self-pay | Admitting: *Deleted

## 2022-05-29 DIAGNOSIS — I10 Essential (primary) hypertension: Secondary | ICD-10-CM

## 2022-05-29 MED ORDER — ENTRESTO 24-26 MG PO TABS: 1.0000 | ORAL_TABLET | Freq: Two times a day (BID) | ORAL | 1 refills | Status: DC

## 2022-06-07 ENCOUNTER — Other Ambulatory Visit: Payer: Self-pay | Admitting: Physician Assistant

## 2022-06-07 DIAGNOSIS — E1165 Type 2 diabetes mellitus with hyperglycemia: Secondary | ICD-10-CM

## 2022-06-15 ENCOUNTER — Other Ambulatory Visit: Payer: Self-pay | Admitting: Nurse Practitioner

## 2022-06-15 DIAGNOSIS — E220 Acromegaly and pituitary gigantism: Secondary | ICD-10-CM

## 2022-06-15 NOTE — Telephone Encounter (Signed)
Rx 03/28/22 #90- too soon Requested Prescriptions  Pending Prescriptions Disp Refills   spironolactone (ALDACTONE) 25 MG tablet [Pharmacy Med Name: Spironolactone 25 MG Oral Tablet] 45 tablet 0    Sig: Take 1 tablet by mouth once daily     Cardiovascular: Diuretics - Aldosterone Antagonist Failed - 06/15/2022  8:50 AM      Failed - Cr in normal range and within 180 days    Creatinine, Ser  Date Value Ref Range Status  02/16/2022 1.31 (H) 0.61 - 1.24 mg/dL Final         Failed - Na in normal range and within 180 days    Sodium  Date Value Ref Range Status  02/16/2022 133 (L) 135 - 145 mmol/L Final  02/21/2021 143 134 - 144 mmol/L Final         Passed - K in normal range and within 180 days    Potassium  Date Value Ref Range Status  02/16/2022 4.6 3.5 - 5.1 mmol/L Final         Passed - eGFR is 30 or above and within 180 days    GFR calc Af Amer  Date Value Ref Range Status  04/06/2020 85 >59 mL/min/1.73 Final    Comment:    **In accordance with recommendations from the NKF-ASN Task force,**   [facility] is in the process of updating its eGFR calculation to the   2021 CKD-EPI creatinine equation that estimates kidney function   without a race variable.    GFR, Estimated  Date Value Ref Range Status  02/16/2022 >60 >60 mL/min Final    Comment:    (NOTE) Calculated using the CKD-EPI Creatinine Equation (2021)    GFR  Date Value Ref Range Status  06/24/2020 69.86 >60.00 mL/min Final    Comment:    Calculated using the CKD-EPI Creatinine Equation (2021)   eGFR  Date Value Ref Range Status  02/21/2021 64 >59 mL/min/1.73 Final         Passed - Last BP in normal range    BP Readings from Last 1 Encounters:  04/26/22 130/80         Passed - Valid encounter within last 6 months    Recent Outpatient Visits           4 months ago Type 2 diabetes mellitus with hyperglycemia, without long-term current use of insulin [facility])   [place]  [place], [place], [place]   6 months ago Type 2 diabetes mellitus with hyperglycemia, without long-term current use of insulin [facility])   [place], [place], [place]   1 year ago Type 2 diabetes mellitus with hyperglycemia, without long-term current use of insulin [facility])   [place], [place] W, NP   1 year ago Type 2 diabetes mellitus with hyperglycemia, without long-term current use of insulin [facility])   [place], [place] W, NP   1 year ago Type 2 diabetes mellitus with hyperglycemia, without long-term current use of insulin [facility])   [place], [place] Buff, NP

## 2022-06-16 NOTE — Progress Notes (Incomplete)
Advanced Heart Failure Clinic Note   PCP: Gildardo Pounds, NP HF Cardiologist: [name]  HPI: 57 y.o. male with  HTN, poorly-controlled DM2, acromegaly and systolic HF due to NICM.   Admitted 7/20 with hypertensive urgency and acute systolic HF. ECG showed LBBB of uncertain duration. Echo EF 25-30%.  Cardiac catheterization on 12/16/18 showed normal coronary arteries with well compensated filling pressures after diuresis.   He was started on GDMT including carvedilol 6.25 bid, Entresto 49/51 bid, spironolactone 12.5 daily. He did well for several days but then developed severe fatigue. I saw him in Clinic on 12/27/18. He was in bigeminy. I stopped his b-blocker and started amio 200 bid.K 5.0 Mg 1.7  Echo 9/21: EF 30-35%, RV ok.  Remains on amio to suppress PVCs. 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 Referred to Dr. Monna Fam. Underwent transphenoidal pituitary resection in 12/21.  Echo 3/22: EF 20-25% + septal dyssynchrony RV ok.   Echo 5/23 EF 25-30% RV mildly HK  Follow up March 21, 2023. Wife passed away from HF on Jun 05, 2021. NYHA II-early III, volume ok. Planning for CRT when DM2 better controlled, referred back to Endo.  Today he returns for HF follow up with his niece. Overall feeling fatigued. Main complaint is numbness in his left pinky finger, he attributes this to a pinched neve in his shoulder or neck. He has SOB walking up steps. Denies palpitations, CP, dizziness, edema, or PND/Orthopnea. Appetite ok. No fever or chills. Weight at home 250 pounds. Taking all medications but no longer on insulin. Living with his brother and SIL in [place] since his wife's death 2021-06-09. Blood sugars have been >300. Works as a Theme park manager, eating poorly since wife died.     Cardiac Studies - Echo (5/23): EF 25-30% RV mildly HK  - Echo (3/22): EF 20-25%, + septal dyssynchrony, RV ok  - Zio 5/23: 8 runs NSVT, 3.5% PVCs   - Echo 9/21: EF 30-35%, RV ok.  - cMRI (10/21/19): difficult study due to  respiratory artifact and frequent ectopy, LVEF 20-25%, di hypokinesis with basal to mid inferolateral akinesis, normal RVEF and function, dense basal to mid inferolateral scar. This looked most consistent with a prior MI in the LCx territory.   - Echo (3/21): EF 25% RV ok.   - Zio (10/20): 4.8%, PVCs  - Echo (11/19): EF ~35-40% with severe dyssynchrony due to LBBB and PVCs   SH:  Social History   Socioeconomic History   Marital status: Widowed    Spouse name: Everlene Farrier   Number of children: 1   Years of education: Not on file   Highest education level: Not on file  Occupational History   Occupation: Minister/carpenter/musician  Tobacco Use   Smoking status: Former   Smokeless tobacco: Never  Scientific laboratory technician Use: Never used  Substance and Sexual Activity   Alcohol use: Never   Drug use: Never   Sexual activity: Yes  Other Topics Concern   Not on file  Social History Narrative   Not on file   Social Determinants of Health   Financial Resource Strain: Not on file  Food Insecurity: No Food Insecurity (02/16/2022)   Hunger Vital Sign    Worried About Running Out of Food in the Last Year: Never true    Ran Out of Food in the Last Year: Never true  Transportation Needs: No Transportation Needs (02/16/2022)   PRAPARE - Hydrologist (Medical): No  Lack of Transportation (Non-Medical): No  Physical Activity: Not on file  Stress: Not on file  Social Connections: Not on file  Intimate Partner Violence: Not on file    FH:  Family History  Problem Relation Age of Onset   Hypertension Mother    Heart attack Mother    Hypertension Father    Hypertension Sister    Hypertension Brother     Past Medical History:  Diagnosis Date   Acromegaly ([place])    CHF (congestive heart failure) ([place])    Essential hypertension    Hypertensive emergency 12/15/2018   Nonischemic cardiomyopathy ([place])    Obesity    Pituitary macroadenoma (HCC)    Systolic  heart failure (HCC)    Type 2 diabetes mellitus with complication (HCC)     Current Outpatient Medications  Medication Sig Dispense Refill   amiodarone (PACERONE) 200 MG tablet Take 1 tablet by mouth once daily 90 tablet 0   glipiZIDE (GLUCOTROL) 10 MG tablet TAKE 1 TABLET BY MOUTH TWICE DAILY BEFORE A MEAL 180 tablet 0   Insulin Pen Needle (PEN NEEDLES) 31G X 5 MM MISC 1 each by Does not apply route daily. 100 each 11   sacubitril-valsartan (ENTRESTO) 24-26 MG Take 1 tablet by mouth 2 (two) times daily. 180 tablet 1   spironolactone (ALDACTONE) 25 MG tablet Take 1 tablet by mouth once daily 90 tablet 0   insulin glargine (LANTUS SOLOSTAR) 100 UNIT/ML Solostar Pen Inject 22 Units into the skin daily. (Patient not taking: Reported on 06/19/2022) 15 mL PRN   No current facility-administered medications for this encounter.   BP (!) 138/92   Pulse 88   Wt 115.7 kg (255 lb)   SpO2 99%   BMI 31.04 kg/m   Wt Readings from Last 3 Encounters:  06/19/22 115.7 kg (255 lb)  04/26/22 116 kg (255 lb 12.8 oz)  02/16/22 116.5 kg (256 lb 12.8 oz)   PHYSICAL EXAM: General:  NAD. No resp difficulty HEENT: Normal Neck: Supple. No JVD. Carotids 2+ bilat; no bruits. No lymphadenopathy or thryomegaly appreciated. Cor: PMI nondisplaced. Regular rate & rhythm. No rubs, gallops or murmurs. Lungs: Clear Abdomen: Soft, nontender, nondistended. No hepatosplenomegaly. No bruits or masses. Good bowel sounds. Extremities: No cyanosis, clubbing, rash, edema Neuro: Alert & oriented x 3, cranial nerves grossly intact. Moves all 4 extremities w/o difficulty. Affect pleasant.  ECG (personally reviewed): NSR LBBB, 2 PVCs 84 bpm, QRS 182 msec  ASSESSMENT & PLAN: 1. Chronic systolic HF - diagnosed 1/82 EF ~25-30%. NICM - cath 7/20 with normal coronaries and compensated hemodynamics - suspect he likely has LBBB +/- PVC-induced CM  - Zio patch 10/20 with 4.8% PVC  - Echo 09/03/19: EF ~25% with severe LV dyssynchrony  (no improvement with PVC suppression) - cMRI 5/21 EF 20-25% suggestive of PVC/LBBB CM but presence of ? Inferolateral scar is puzzling.  - Echo 9/21: EF 30-35%, severe decrease LV function with dyssynchrony, RV normal - Echo 08/23/20: EF 20-25% + septal dyssynchrony RV ok. - Echo 5/23: EF 25-30% - Zio 5/23: 8 runs NSVT, 3.5% PVCs  - PVCs mostly suppressed EF still down. Suspect LBBB CM - Seen by EP for consideration of CRT, felt due to lack of significant symptoms and no activity limitations, low risk for cardiac arrest and would defer device at this time pending ongoing EF improvement. - NYHA II-early III. Volume status ok. - Continue Entresto 24/26 mg bid (did not feel well on higher dose). - Continue spiro 25  mg daily. - Off Coreg with severe fatigue. (Will not re-challenge with Toprol given on-going fatigue, though suspect symptoms in part related to uncontrolled DM2.) - Off Farxiga due to intolerance (nausea) will not reattempt currently until A1c bettrer controlled - Given persistent LV dysfunction and NYHA II-III symptoms I think he will benefit from CRT but will wait until DM2 better controlled first. Check A1C today. - Labs today.  2. Frequent PVCs - Amio decreased to 100 daily.  - zio 10/20 4.8%  - Zio 5/23 3.5% PVCs - Can consider switch to mexilitene as needed.  - home sleep study with AHI 3. (No significant OSA) - LFTs ok 5/23 and TSH ok 11/23. - 2 PVCs on ECG today.  3. HTN - BP mildly up today, generally well-controlled at home.  - Continue current regimen.  4. LBBB - Discussion as above. Has seen EP and CRT consideration deferred.  - Given persistent LV dysfunction and NYHA II-III symptoms, I think he will benefit from CRT but will wait until DM2 better controlled first - QRS 182 msec on ECG today.  5. Poorly controlled DM2 - Followed by Endo - Last HGBA1c 15, he is off insulin (has new insurance and plans to restart). - Asked him to watch his intake closely - No  SGLT2i for now  - Check A1c today.  6. Acromegaly - has growth hormone excess - S/p  transphenoid pituitary resection surgery & sinus septoplasty 12/21  Follow up in 3-4 months with [name].  Rafael Bihari, FNP  10:14 AM

## 2022-06-19 ENCOUNTER — Ambulatory Visit (HOSPITAL_COMMUNITY)
Admission: RE | Admit: 2022-06-19 | Discharge: 2022-06-19 | Disposition: A | Discharge status: Home / Self Care | Payer: PRIVATE HEALTH INSURANCE | Source: Ambulatory Visit | Attending: Family Medicine | Admitting: Family Medicine

## 2022-06-19 ENCOUNTER — Telehealth (HOSPITAL_COMMUNITY): Payer: Self-pay

## 2022-06-19 ENCOUNTER — Emergency Department (HOSPITAL_COMMUNITY)
Admission: EM | Admit: 2022-06-19 | Discharge: 2022-06-19 | Disposition: A | Discharge status: Home / Self Care | Payer: PRIVATE HEALTH INSURANCE | Attending: Emergency Medicine | Admitting: Emergency Medicine

## 2022-06-19 ENCOUNTER — Other Ambulatory Visit: Payer: Self-pay

## 2022-06-19 ENCOUNTER — Telehealth (HOSPITAL_COMMUNITY): Payer: Self-pay | Admitting: *Deleted

## 2022-06-19 ENCOUNTER — Encounter (HOSPITAL_COMMUNITY): Payer: Self-pay

## 2022-06-19 VITALS — BP 138/92 | HR 88 | Wt 255.0 lb

## 2022-06-19 DIAGNOSIS — I428 Other cardiomyopathies: Secondary | ICD-10-CM | POA: Insufficient documentation

## 2022-06-19 DIAGNOSIS — I110 Hypertensive heart disease with heart failure: Secondary | ICD-10-CM | POA: Insufficient documentation

## 2022-06-19 DIAGNOSIS — E1165 Type 2 diabetes mellitus with hyperglycemia: Secondary | ICD-10-CM | POA: Insufficient documentation

## 2022-06-19 DIAGNOSIS — I10 Essential (primary) hypertension: Secondary | ICD-10-CM

## 2022-06-19 DIAGNOSIS — I493 Ventricular premature depolarization: Secondary | ICD-10-CM

## 2022-06-19 DIAGNOSIS — R200 Anesthesia of skin: Secondary | ICD-10-CM | POA: Insufficient documentation

## 2022-06-19 DIAGNOSIS — Z79899 Other long term (current) drug therapy: Secondary | ICD-10-CM | POA: Insufficient documentation

## 2022-06-19 DIAGNOSIS — R739 Hyperglycemia, unspecified: Secondary | ICD-10-CM

## 2022-06-19 DIAGNOSIS — I447 Left bundle-branch block, unspecified: Secondary | ICD-10-CM | POA: Insufficient documentation

## 2022-06-19 DIAGNOSIS — Z7984 Long term (current) use of oral hypoglycemic drugs: Secondary | ICD-10-CM | POA: Insufficient documentation

## 2022-06-19 DIAGNOSIS — R0602 Shortness of breath: Secondary | ICD-10-CM | POA: Insufficient documentation

## 2022-06-19 DIAGNOSIS — E220 Acromegaly and pituitary gigantism: Secondary | ICD-10-CM | POA: Insufficient documentation

## 2022-06-19 DIAGNOSIS — Z794 Long term (current) use of insulin: Secondary | ICD-10-CM | POA: Insufficient documentation

## 2022-06-19 DIAGNOSIS — I5022 Chronic systolic (congestive) heart failure: Secondary | ICD-10-CM

## 2022-06-19 DIAGNOSIS — L905 Scar conditions and fibrosis of skin: Secondary | ICD-10-CM | POA: Insufficient documentation

## 2022-06-19 DIAGNOSIS — I509 Heart failure, unspecified: Secondary | ICD-10-CM | POA: Insufficient documentation

## 2022-06-19 LAB — URINALYSIS, ROUTINE W REFLEX MICROSCOPIC
Bacteria, UA: NONE SEEN
Bilirubin Urine: NEGATIVE
Glucose, UA: >=500 mg/dL — AB
Hgb urine dipstick: NEGATIVE
Ketones, ur: NEGATIVE mg/dL
Leukocytes,Ua: NEGATIVE
Nitrite: NEGATIVE
Protein, ur: NEGATIVE mg/dL
Specific Gravity, Urine: 1.025 (ref 1.005–1.030)
pH: 7 (ref 5.0–8.0)

## 2022-06-19 LAB — I-STAT VENOUS BLOOD GAS, ED
Acid-Base Excess: 2 mmol/L (ref 0.0–2.0)
Bicarbonate: 28 mmol/L (ref 20.0–28.0)
Calcium, Ion: 1.25 mmol/L (ref 1.15–1.40)
HCT: 47 % (ref 39.0–52.0)
Hemoglobin: 16 g/dL (ref 13.0–17.0)
O2 Saturation: 69 %
Potassium: 4.6 mmol/L (ref 3.5–5.1)
Sodium: 135 mmol/L (ref 135–145)
TCO2: 29 mmol/L (ref 22–32)
pCO2, Ven: 47.3 mmHg (ref 44–60)
pH, Ven: 7.38 (ref 7.25–7.43)
pO2, Ven: 37 mmHg (ref 32–45)

## 2022-06-19 LAB — CBC
HCT: 46.7 % (ref 39.0–52.0)
Hemoglobin: 16.2 g/dL (ref 13.0–17.0)
MCH: 31.2 pg (ref 26.0–34.0)
MCHC: 34.7 g/dL (ref 30.0–36.0)
MCV: 90 fL (ref 80.0–100.0)
Platelets: 183 10*3/uL (ref 150–400)
RBC: 5.19 MIL/uL (ref 4.22–5.81)
RDW: 12 % (ref 11.5–15.5)
WBC: 5.3 10*3/uL (ref 4.0–10.5)
nRBC: 0 % (ref 0.0–0.2)

## 2022-06-19 LAB — CBG MONITORING, ED
Glucose-Capillary: >600 mg/dL (ref 70–99)
Glucose-Capillary: 402 mg/dL — ABNORMAL HIGH (ref 70–99)

## 2022-06-19 LAB — BASIC METABOLIC PANEL
Anion gap: 11 (ref 5–15)
Anion gap: 12 (ref 5–15)
BUN: 16 mg/dL (ref 6–20)
BUN: 17 mg/dL (ref 6–20)
CO2: 28 mmol/L (ref 22–32)
CO2: 28 mmol/L (ref 22–32)
Calcium: 9 mg/dL (ref 8.9–10.3)
Calcium: 9.6 mg/dL (ref 8.9–10.3)
Chloride: 92 mmol/L — ABNORMAL LOW (ref 98–111)
Chloride: 93 mmol/L — ABNORMAL LOW (ref 98–111)
Creatinine, Ser: 1.39 mg/dL — ABNORMAL HIGH (ref 0.61–1.24)
Creatinine, Ser: 1.37 mg/dL — ABNORMAL HIGH (ref 0.61–1.24)
GFR, Estimated: 59 mL/min — ABNORMAL LOW (ref >60)
GFR, Estimated: 60 mL/min — ABNORMAL LOW (ref >60)
Glucose, Bld: 573 mg/dL (ref 70–99)
Glucose, Bld: 614 mg/dL (ref 70–99)
Potassium: 4.5 mmol/L (ref 3.5–5.1)
Potassium: 4.9 mmol/L (ref 3.5–5.1)
Sodium: 131 mmol/L — ABNORMAL LOW (ref 135–145)
Sodium: 133 mmol/L — ABNORMAL LOW (ref 135–145)

## 2022-06-19 LAB — BRAIN NATRIURETIC PEPTIDE: B Natriuretic Peptide: 27.2 pg/mL (ref 0.0–100.0)

## 2022-06-19 MED ORDER — SODIUM CHLORIDE 0.9 % IV BOLUS (SEPSIS)
1000.0000 mL | Freq: Once | INTRAVENOUS | Status: AC
  Administered 2022-06-19: 1000 mL via INTRAVENOUS

## 2022-06-19 MED ORDER — SODIUM CHLORIDE 0.9 % IV SOLN
1000.0000 mL | INTRAVENOUS | Status: DC
  Administered 2022-06-19: 1000 mL via INTRAVENOUS

## 2022-06-19 NOTE — Discharge Instructions (Addendum)
You have been seen today for your complaint of high blood sugar. Your lab work showed your blood sugar was greater than 600 when he came in, was reduced to 400 with IV fluids. Your discharge medications include your home medications.  Take them as prescribed. Home care instructions are as follows:  Drink plenty of fluids.  Avoid a high carbohydrate diet. Follow up with: Your endocrinologist in regards to your persistently high blood sugars Please seek immediate medical care if you develop any of the following symptoms: Your blood glucose monitor reads "high" even when you are taking insulin. You have trouble breathing. You have a change in how you think, feel, or act (mental status). You have nausea or vomiting that does not go away. At this time there does not appear to be the presence of an emergent medical condition, however there is always the potential for conditions to change. Please read and follow the below instructions.  Do not take your medicine if  develop an itchy rash, swelling in your mouth or lips, or difficulty breathing; call 911 and seek immediate emergency medical attention if this occurs.  You may review your lab tests and imaging results in their entirety on your MyChart account.  Please discuss all results of fully with your primary care provider and other specialist at your follow-up visit.  Note: Portions of this text may have been transcribed using voice recognition software. Every effort was made to ensure accuracy; however, inadvertent computerized transcription errors may still be present.

## 2022-06-19 NOTE — ED Triage Notes (Addendum)
Patient sent to ED for evaluation of hyperglycemia, history of diabetes on glipizide. Patient denies blurred vision, denies polyuria, denies polydipsia, no tachypnea, reports feeling a little tired over the last few days. Patient is alert, oriented, ambulating independently with steady gait, and is in no apparent distress at this time.

## 2022-06-19 NOTE — ED Provider Triage Note (Signed)
Emergency Medicine Provider Triage Evaluation Note  Yaroslav Gombos , a 57 y.o. male  was evaluated in triage.  Pt complains of elevated glucose.  Glucose over 600 at triage.  Pt reports he has made some bad life style choices   Review of Systems  Positive: High glucose Negative: fever  Physical Exam  BP (!) 145/95 (BP Location: Right Arm)   Pulse 87   Temp 98.2 F (36.8 C) (Oral)   Resp 16   SpO2 98%  Gen:   Awake, no distress   Resp:  Normal effort  MSK:   Moves extremities without difficulty  Other:    Medical Decision Making  Medically screening exam initiated at 2:10 PM.  Appropriate orders placed.  [name] was informed that the remainder of the evaluation will be completed by another provider, this initial triage assessment does not replace that evaluation, and the importance of remaining in the ED until their evaluation is complete.     Fransico Meadow, [place] 06/19/22 1411

## 2022-06-19 NOTE — ED Provider Notes (Signed)
[facility] EMERGENCY DEPARTMENT Provider Note   CSN: [id number] Arrival date & time: 06/19/22  1224     History  Chief Complaint  Patient presents with   Hyperglycemia    Kevin Hampton is a 57 y.o. male.  With a history of type 2 diabetes mellitus recent A1c greater than 15, obesity, CHF, acromegaly, pituitary adenoma, hypertension who presents to the ED for evaluation of hyperglycemia.  He was at his cardiology office this morning when he had labs drawn.  This afternoon he received a call from the nurse to encourage patient to present to the ED for evaluation of hyperglycemia.  States that it was 570's at that time.  He states that his blood sugar has been running between 300 and 400 for the past "couple months."  Checks his blood sugar once or twice per week.  Takes glipizide twice daily but no other diabetic medications.  Has not missed any doses.  Stopped taking metformin due to concern of stomach issues and kidney failure.  States he has not felt any different than normal today over the past couple days.  Specifically denies polyuria, polydipsia, polyphagia, abdominal pain, nausea, vomiting, fevers.  Follows with endocrinology, last saw them 3 to 4 months ago.  Has another follow-up appointment in 2 months.   Hyperglycemia      Home Medications Prior to Admission medications   Medication Sig Start Date End Date Taking? Authorizing Provider  amiodarone (PACERONE) 200 MG tablet Take 1 tablet by mouth once daily 03/31/22   Bensimhon, Shaune Pascal, MD  glipiZIDE (GLUCOTROL) 10 MG tablet TAKE 1 TABLET BY MOUTH TWICE DAILY BEFORE A MEAL 06/07/22   Charlott Rakes, MD  insulin glargine (LANTUS SOLOSTAR) 100 UNIT/ML Solostar Pen Inject 22 Units into the skin daily. Patient not taking: Reported on 06/19/2022 02/08/22   Argentina Donovan, PA-C  Insulin Pen Needle (PEN NEEDLES) 31G X 5 MM MISC 1 each by Does not apply route daily. 02/08/22   Argentina Donovan, PA-C   sacubitril-valsartan (ENTRESTO) 24-26 MG Take 1 tablet by mouth 2 (two) times daily. 05/29/22   Bensimhon, Shaune Pascal, MD  spironolactone (ALDACTONE) 25 MG tablet Take 1 tablet by mouth once daily 03/28/22   Charlott Rakes, MD      Allergies    Coreg [carvedilol] and Wilder Glade [dapagliflozin]    Review of Systems   Review of Systems  All other systems reviewed and are negative.   Physical Exam Updated Vital Signs BP (!) 135/90   Pulse 89   Temp 99.1 F (37.3 C)   Resp 17   SpO2 98%  Physical Exam Vitals and nursing note reviewed.  Constitutional:      General: He is not in acute distress.    Appearance: He is well-developed. He is obese. He is not ill-appearing, toxic-appearing or diaphoretic.  HENT:     Head: Normocephalic and atraumatic.     Comments: Acromegaly    Mouth/Throat:     Mouth: Mucous membranes are moist.     Pharynx: Oropharynx is clear. No oropharyngeal exudate or posterior oropharyngeal erythema.  Eyes:     Conjunctiva/sclera: Conjunctivae normal.  Cardiovascular:     Rate and Rhythm: Normal rate and regular rhythm.     Pulses: Normal pulses.     Heart sounds: No murmur heard. Pulmonary:     Effort: Pulmonary effort is normal. No respiratory distress.     Breath sounds: Normal breath sounds. No stridor. No wheezing, rhonchi or rales.  Abdominal:     Palpations: Abdomen is soft.     Tenderness: There is no abdominal tenderness.  Musculoskeletal:     Cervical back: Neck supple.     Comments: Moderate swelling of bilateral hands  Skin:    General: Skin is warm and dry.     Capillary Refill: Capillary refill takes less than 2 seconds.  Neurological:     General: No focal deficit present.     Mental Status: He is alert and oriented to person, place, and time.  Psychiatric:        Mood and Affect: Mood normal.     ED Results / Procedures / Treatments   Labs (all labs ordered are listed, but only abnormal results are displayed) Labs Reviewed   BASIC METABOLIC PANEL - Abnormal; Notable for the following components:      Result Value   Sodium 133 (*)    Chloride 93 (*)    Glucose, Bld 614 (*)    Creatinine, Ser 1.37 (*)    GFR, Estimated 60 (*)    All other components within normal limits  URINALYSIS, ROUTINE W REFLEX MICROSCOPIC - Abnormal; Notable for the following components:   Color, Urine STRAW (*)    Glucose, UA >=500 (*)    All other components within normal limits  CBG MONITORING, ED - Abnormal; Notable for the following components:   Glucose-Capillary >600 (*)    All other components within normal limits  CBG MONITORING, ED - Abnormal; Notable for the following components:   Glucose-Capillary 402 (*)    All other components within normal limits  CBC  I-STAT VENOUS BLOOD GAS, ED  CBG MONITORING, ED    EKG EKG Interpretation  Date/Time:  Monday June 19 2022 16:58:32 EST Ventricular Rate:  80 PR Interval:  150 QRS Duration: 184 QT Interval:  442 QTC Calculation: 509 R Axis:   -11 Text Interpretation: Sinus rhythm with occasional Premature ventricular complexes Left bundle branch block Abnormal ECG No significant change since last tracing today Confirmed by Leanord Asal ([id number]) on 06/19/2022 7:23:24 PM  Radiology No results found.  Procedures Procedures    Medications Ordered in ED Medications  sodium chloride 0.9 % bolus 1,000 mL (0 mLs Intravenous Stopped 06/19/22 1847)    Followed by  sodium chloride 0.9 % bolus 1,000 mL (0 mLs Intravenous Stopped 06/19/22 1847)    Followed by  0.9 %  sodium chloride infusion (0 mLs Intravenous Stopped 06/19/22 2003)    ED Course/ Medical Decision Making/ A&P                           Medical Decision Making Amount and/or Complexity of Data Reviewed Labs: ordered.  This patient presents to the ED for concern of hyperglycemia, this involves an extensive number of treatment options, and is a complaint that carries with it a high risk of complications and  morbidity.  The differential diagnosis includes hyperglycemia, DKA, HHS   Co morbidities that complicate the patient evaluation  type 2 diabetes mellitus recent A1c greater than 15, obesity, CHF, acromegaly, pituitary adenoma, hypertension  My initial workup includes basic labs, VBG, IV fluids  Additional history obtained from: Nursing notes from this visit. Previous records within EMR system cardiology note from today,  I ordered, reviewed and interpreted labs which include: BMP, CBC, VBG, urinalysis.  Hyperglycemia of 614 initially with creatinine of 1.37.  Reduced to 402 after 2 L IV fluids.  Urinalysis shows  glucose, otherwise negative  Afebrile, hypertensive but hemodynamically stable.  57 year old male presenting to the ED for evaluation of hyperglycemia.  This was an incidental finding on labs that were collected by his cardiology office.  He was found to be near 600 this morning.  He states he has been running between 300 and 400 for the past 3 months. His last A1c was >15. He stopped taking his insulin 3 months ago due to cost. His blood sugar was reduced to 400 in the ED with 2 liters of IV fluids. His VBG shows no signs of acidosis and his anion gap is normal. He is not in DKA or HHS. His physical exam is remarkable for acromegaly. He has moist mucous membranes. He is in no apparent distress. He was encouraged to get in to his endocrinologist soon to discuss treatment options moving forward. He was given strict return precautions. Stable at discharge.  At this time there does not appear to be any evidence of an acute emergency medical condition and the patient appears stable for discharge with appropriate outpatient follow up. Diagnosis was discussed with patient who verbalizes understanding of care plan and is agreeable to discharge. I have discussed return precautions with patient who verbalizes understanding. Patient encouraged to follow-up with their PCP within 1 week. All questions  answered.  Patient's case discussed with Dr. Maylon Peppers who agrees with plan to discharge with follow-up.   Note: Portions of this report may have been transcribed using voice recognition software. Every effort was made to ensure accuracy; however, inadvertent computerized transcription errors may still be present.         Final Clinical Impression(s) / ED Diagnoses Final diagnoses:  Hyperglycemia    Rx / DC Orders ED Discharge Orders     None         Roylene Reason, Hershal Coria 06/19/22 2017    Leanord Asal K, DO 06/20/22 0009

## 2022-06-19 NOTE — Patient Instructions (Addendum)
Thank you for coming in today  Labs were done today, if any labs are abnormal the clinic will call you No news is good news  Your physician recommends that you schedule a follow-up appointment in:  3-4 months with [name] You will receive a reminder letter in the mail a few months in advance. If you don't receive a letter, please call our office to schedule the follow-up appointment.    Do the following things EVERYDAY: Weigh yourself in the morning before breakfast. Write it down and keep it in a log. Take your medicines as prescribed Eat low salt foods--Limit salt (sodium) to 2000 mg per day.  Stay as active as you can everyday Limit all fluids for the day to less than 2 liters  At the [facility], you and your health needs are our priority. As part of our continuing mission to provide you with exceptional heart care, we have created designated Provider Care Teams. These Care Teams include your primary Cardiologist (physician) and Advanced Practice Providers (APPs- Physician Assistants and Nurse Practitioners) who all work together to provide you with the care you need, when you need it.   You may see any of the following providers on your designated Care Team at your next follow up: Dr Glori Bickers Dr Loralie Champagne Dr. Roxana Hires, NP Lyda Jester, [place] [facility] [place], [place] Forestine Na, NP Audry Riles, PharmD   Please be sure to bring in all your medications bottles to every appointment.   If you have any questions or concerns before your next appointment please send [place] a message through [place] or call our office at [phone].    TO LEAVE A MESSAGE FOR THE NURSE SELECT OPTION 2, PLEASE LEAVE A MESSAGE INCLUDING: YOUR NAME DATE OF BIRTH CALL BACK NUMBER REASON FOR CALL**this is important as we prioritize the call backs  YOU WILL RECEIVE A CALL BACK THE SAME DAY AS LONG AS YOU CALL BEFORE 4:00 PM

## 2022-06-19 NOTE — Telephone Encounter (Signed)
The lab called and reported a critical glucose of 573. Jessica Milford,FNP aware. Per Janett Billow call pt and have him recheck if still elevated have him go to the emergency room. I called pt he said he will recheck and call me back he is aware that if its still elevated he needs to go to the emergency room.

## 2022-06-19 NOTE — Telephone Encounter (Signed)
I spoke to patient about critical blood sugar level. He is coming back to [facility] ED now to have it rechecked and treated.

## 2022-06-19 NOTE — ED Notes (Signed)
Glucose called from main lab [id number]-triage PA notied

## 2022-06-20 ENCOUNTER — Other Ambulatory Visit: Payer: PRIVATE HEALTH INSURANCE

## 2022-06-20 LAB — HEMOGLOBIN A1C
Hgb A1c MFr Bld: 15 % — ABNORMAL HIGH (ref 4.8–5.6)
Mean Plasma Glucose: 384 mg/dL

## 2022-06-21 ENCOUNTER — Telehealth (HOSPITAL_COMMUNITY): Payer: Self-pay

## 2022-06-21 NOTE — Telephone Encounter (Signed)
I spoke to Kevin Hampton. He went to ED earlier this week and got his BS down. He checked this morning and it was 299. He is calling his Endocrinologist today to get in with them sooner to follow up.

## 2022-08-25 ENCOUNTER — Ambulatory Visit: Payer: PRIVATE HEALTH INSURANCE | Admitting: Internal Medicine

## 2022-08-29 ENCOUNTER — Other Ambulatory Visit (HOSPITAL_COMMUNITY): Payer: Self-pay | Admitting: Cardiology

## 2022-08-29 DIAGNOSIS — I10 Essential (primary) hypertension: Secondary | ICD-10-CM

## 2022-08-29 MED ORDER — ENTRESTO 24-26 MG PO TABS: 1.0000 | ORAL_TABLET | Freq: Two times a day (BID) | ORAL | 3 refills | Status: DC

## 2022-08-30 ENCOUNTER — Other Ambulatory Visit (HOSPITAL_COMMUNITY): Payer: Self-pay

## 2022-08-30 ENCOUNTER — Telehealth (HOSPITAL_COMMUNITY): Payer: Self-pay

## 2022-08-30 NOTE — Telephone Encounter (Signed)
Patient Advocate Encounter  Prior authorization is required for [facility]. PA submitted and APPROVED on 08/30/22.  Key ID:[id number] Effective: 08/30/22 - 08/30/23  Clista Bernhardt, CPhT Rx Patient Advocate Phone: [phone]

## 2022-08-31 ENCOUNTER — Telehealth (HOSPITAL_COMMUNITY): Payer: Self-pay

## 2022-08-31 NOTE — Telephone Encounter (Signed)
Patient called and stated pharmacy still rejecting his Entresto. Is there a way to check if PA has gotten to them?

## 2022-09-01 NOTE — Telephone Encounter (Signed)
Patient Advocate Encounter  Spoke to pharmacy, medication was billed to insurance and picked up on 08/31/22. Nothing further needed at this time

## 2022-09-09 ENCOUNTER — Other Ambulatory Visit: Payer: Self-pay | Admitting: Family Medicine

## 2022-09-09 DIAGNOSIS — E1165 Type 2 diabetes mellitus with hyperglycemia: Secondary | ICD-10-CM

## 2022-09-11 ENCOUNTER — Other Ambulatory Visit: Payer: Self-pay | Admitting: Family Medicine

## 2022-09-11 ENCOUNTER — Other Ambulatory Visit (HOSPITAL_COMMUNITY): Payer: Self-pay | Admitting: Internal Medicine

## 2022-09-11 DIAGNOSIS — E1165 Type 2 diabetes mellitus with hyperglycemia: Secondary | ICD-10-CM

## 2022-09-11 NOTE — Telephone Encounter (Signed)
Courtesy refill. Patient will need an office visit for further refills. Requested Prescriptions  Pending Prescriptions Disp Refills   glipiZIDE (GLUCOTROL) 10 MG tablet [Pharmacy Med Name: glipiZIDE 10 MG Oral Tablet] 30 tablet 0    Sig: TAKE 1 TABLET BY MOUTH TWICE DAILY BEFORE A MEAL     Endocrinology:  Diabetes - Sulfonylureas Failed - 09/09/2022  9:34 AM      Failed - HBA1C is between 0 and 7.9 and within 180 days    HbA1c, POC (controlled diabetic range)  Date Value Ref Range Status  11/19/2020 7.1 (A) 0.0 - 7.0 % Final   HbA1c POC (<> result, manual entry)  Date Value Ref Range Status  02/08/2022 >15 4.0 - 5.6 % Final    Comment:    Greater than 15   Hgb A1c MFr Bld  Date Value Ref Range Status  06/19/2022 15.0 (H) 4.8 - 5.6 % Final    Comment:    (NOTE)         Prediabetes: 5.7 - 6.4         Diabetes: >6.4         Glycemic control for adults with diabetes: <7.0          Failed - Cr in normal range and within 360 days    Creatinine, Ser  Date Value Ref Range Status  06/19/2022 1.37 (H) 0.61 - 1.24 mg/dL Final         Failed - Valid encounter within last 6 months    Recent Outpatient Visits           7 months ago Type 2 diabetes mellitus with hyperglycemia, without long-term current use of insulin [facility])   [place], [place], [place]   9 months ago Type 2 diabetes mellitus with hyperglycemia, without long-term current use of insulin [facility])   [place], [place], [place]   1 year ago Type 2 diabetes mellitus with hyperglycemia, without long-term current use of insulin [facility])   [place], [place] W, NP   1 year ago Type 2 diabetes mellitus with hyperglycemia, without long-term current use of insulin [facility])   [place], [place] W, NP   2 years ago Type 2 diabetes mellitus with hyperglycemia, without  long-term current use of insulin [facility])   [place], Vernia Buff, NP

## 2022-09-11 NOTE — Telephone Encounter (Signed)
Called pt - left message to return call for appointment.

## 2022-10-11 ENCOUNTER — Encounter: Payer: Self-pay | Admitting: Nurse Practitioner

## 2022-10-11 ENCOUNTER — Ambulatory Visit: Payer: BLUE CROSS/BLUE SHIELD | Attending: Nurse Practitioner | Admitting: Nurse Practitioner

## 2022-10-11 VITALS — BP 134/79 | HR 74 | Ht 76.0 in | Wt 253.8 lb

## 2022-10-11 DIAGNOSIS — E785 Hyperlipidemia, unspecified: Secondary | ICD-10-CM

## 2022-10-11 DIAGNOSIS — I10 Essential (primary) hypertension: Secondary | ICD-10-CM | POA: Diagnosis not present

## 2022-10-11 DIAGNOSIS — Z125 Encounter for screening for malignant neoplasm of prostate: Secondary | ICD-10-CM

## 2022-10-11 DIAGNOSIS — E1165 Type 2 diabetes mellitus with hyperglycemia: Secondary | ICD-10-CM

## 2022-10-11 DIAGNOSIS — D352 Benign neoplasm of pituitary gland: Secondary | ICD-10-CM

## 2022-10-11 DIAGNOSIS — R7989 Other specified abnormal findings of blood chemistry: Secondary | ICD-10-CM | POA: Diagnosis not present

## 2022-10-11 LAB — POCT GLYCOSYLATED HEMOGLOBIN (HGB A1C): Hemoglobin A1C: 13.8 % — AB (ref 4.0–5.6)

## 2022-10-11 MED ORDER — SPIRONOLACTONE 25 MG PO TABS: 25.0000 mg | ORAL_TABLET | Freq: Every day | ORAL | 1 refills | Status: DC

## 2022-10-11 MED ORDER — GLIPIZIDE 10 MG PO TABS: 10.0000 mg | ORAL_TABLET | Freq: Two times a day (BID) | ORAL | 1 refills | Status: DC

## 2022-10-11 MED ORDER — LANTUS SOLOSTAR 100 UNIT/ML ~~LOC~~ SOPN: 22.0000 [IU] | PEN_INJECTOR | Freq: Every day | SUBCUTANEOUS | 99 refills | Status: DC

## 2022-10-11 MED ORDER — PEN NEEDLES 31G X 5 MM MISC: 1.0000 | Freq: Every day | 11 refills | Status: DC

## 2022-10-11 NOTE — Patient Instructions (Signed)
Increase lantus insulin by 2 units every 3 days for fasting blood glucose greater than 130. You should only do this every 3 days if needed. If your fasting blood sugar is not higher than 130 then just continue the current dose

## 2022-10-11 NOTE — Progress Notes (Signed)
Assessment & Plan:  Kevin Hampton was seen today for diabetes and hypertension.  Diagnoses and all orders for this visit:  Type 2 diabetes mellitus with hyperglycemia, without long-term current use of insulin  Not at goal -     POCT glycosylated hemoglobin (Hb A1C) -     glipiZIDE (GLUCOTROL) 10 MG tablet; Take 1 tablet (10 mg total) by mouth 2 (two) times daily before a meal. -     insulin glargine (LANTUS SOLOSTAR) 100 UNIT/ML Solostar Pen; Inject 22 Units into the skin daily. -     Insulin Pen Needle (PEN NEEDLES) 31G X 5 MM MISC; 1 each by Does not apply route daily. -     CMP14+EGFR  Primary hypertension Continue all antihypertensives as prescribed.  Reminded to bring in blood pressure log for follow  up appointment.  RECOMMENDATIONS: DASH/Mediterranean Diets are healthier choices for HTN.   -     spironolactone (ALDACTONE) 25 MG tablet; Take 1 tablet (25 mg total) by mouth daily. -     CMP14+EGFR  Dyslipidemia, goal LDL below 70 INSTRUCTIONS: Work on a low fat, heart healthy diet and participate in regular aerobic exercise program by working out at least 150 minutes per week; 5 days a week-30 minutes per day. Avoid red meat/beef/steak,  fried foods. junk foods, sodas, sugary drinks, unhealthy snacking, alcohol and smoking.  Drink at least 80 oz of water per day and monitor your carbohydrate intake daily.   -     Lipid panel  Abnormal CBC -     CBC with Differential  Pituitary macroadenoma (HCC) -     Thyroid Panel With TSH  Prostate cancer screening -     PSA    Patient has been counseled on age-appropriate routine health concerns for screening and prevention. These are reviewed and up-to-date. Referrals have been placed accordingly. Immunizations are up-to-date or declined.    Subjective:   Chief Complaint  Patient presents with   Diabetes   Hypertension    Kevin Hampton 57 y.o. male presents to office today for follow up to HTN and DM. I have not seen him in almost 2  years.   DECLINES COLONOSCOPY  He has a past medical history of Acromegaly (Followed by Endo but does not want to return to current endo nor does he want me to place referral for second opnion), CHF, Essential hypertension, Hypertensive emergency (12/15/2018), Nonischemic cardiomyopathy, NSTEMI,  LBBB, Obesity, Pituitary macroadenoma (S/P transphenoidal surgery), Systolic heart failure, and Type 2 diabetes mellitus with complication.   DM Poorly controlled. He is aware of potential complications from poorly controlled diabetes. He stopped taking metformin due to potential side effects. States he was afraid to take it due to medical issues it had caused some of his friends who took it. A1c not at goal. He is currently out of glipizide and lantus. He reports average fasting readings 130-170s. He does not routinely monitor postprandial. States he had insurance issues and had not been able to pick up his lantus. He had side effects with farxiga but can't recall the specific side effects.  Lab Results  Component Value Date   HGBA1C 13.8 (A) 10/11/2022    Lab Results  Component Value Date   HGBA1C 15.0 (H) 06/19/2022  LDL not at goal. Declines statin.   Lab Results  Component Value Date   LDLCALC 115 (H) 11/19/2020      HTN Well controlled. He is currently prescribed amiodarone 200 mg daily, entrestor 24-26 mg BID  and spirinolactone 25 mg daily. Could not take coreg due to severe fatigue. He has been out of spironolactone 25mg .  BP Readings from Last 3 Encounters:  10/11/22 134/79  06/19/22 (!) 135/90  06/19/22 (!) 138/92      Review of Systems  Constitutional:  Negative for fever, malaise/fatigue and weight loss.  HENT: Negative.  Negative for nosebleeds.   Eyes: Negative.  Negative for blurred vision, double vision and photophobia.  Respiratory: Negative.  Negative for cough and shortness of breath.   Cardiovascular: Negative.  Negative for chest pain, palpitations and leg swelling.   Gastrointestinal: Negative.  Negative for heartburn, nausea and vomiting.  Musculoskeletal: Negative.  Negative for myalgias.  Neurological: Negative.  Negative for dizziness, focal weakness, seizures and headaches.  Psychiatric/Behavioral: Negative.  Negative for suicidal ideas.     Past Medical History:  Diagnosis Date   Acromegaly (HCC)    CHF (congestive heart failure) (HCC)    Essential hypertension    Hypertensive emergency 12/15/2018   Nonischemic cardiomyopathy (HCC)    Obesity    Pituitary macroadenoma (HCC)    Systolic heart failure (HCC)    Type 2 diabetes mellitus with complication [facility])     Past Surgical History:  Procedure Laterality Date   CRANIOTOMY N/A 05/26/2020   Procedure: ENDOSCOPIC TRANSPHENOIDAL RESECTION OF TUMOR;  Surgeon: Bedelia Person, MD;  Location: [facility] OR;  Service: Neurosurgery;  Laterality: N/A;   RIGHT/LEFT HEART CATH AND CORONARY ANGIOGRAPHY N/A 12/16/2018   Procedure: RIGHT/LEFT HEART CATH AND CORONARY ANGIOGRAPHY;  Surgeon: Runell Gess, MD;  Location: [facility] INVASIVE CV LAB;  Service: Cardiovascular;  Laterality: N/A;   TRANSPHENOIDAL APPROACH EXPOSURE N/A 05/26/2020   Procedure: TRANSPHENOIDAL APPROACH EXPOSURE;  Surgeon: Osborn Coho, MD;  Location: [facility] [place] [facility] OR;  Service: ENT;  Laterality: N/A;    Family History  Problem Relation Age of Onset   Hypertension Mother    Heart attack Mother    Hypertension Father    Hypertension Sister    Hypertension Brother     Social History Reviewed with no changes to be made today.   Outpatient Medications Prior to Visit  Medication Sig Dispense Refill   amiodarone (PACERONE) 200 MG tablet Take 1 tablet by mouth once daily 90 tablet 0   sacubitril-valsartan (ENTRESTO) 24-26 MG Take 1 tablet by mouth 2 (two) times daily. 180 tablet 3   glipiZIDE (GLUCOTROL) 10 MG tablet TAKE 1 TABLET BY MOUTH TWICE DAILY BEFORE A MEAL 30 tablet 0   Insulin Pen Needle (PEN NEEDLES) 31G X 5 MM MISC 1 each by Does not  apply route daily. 100 each 11   insulin glargine (LANTUS SOLOSTAR) 100 UNIT/ML Solostar Pen Inject 22 Units into the skin daily. (Patient not taking: Reported on 06/19/2022) 15 mL PRN   spironolactone (ALDACTONE) 25 MG tablet Take 1 tablet by mouth once daily (Patient not taking: Reported on 10/11/2022) 90 tablet 0   No facility-administered medications prior to visit.    Allergies  Allergen Reactions   Coreg [Carvedilol] Other (See Comments)    Brings heart rate and blood pressure down severly   Farxiga [Dapagliflozin] Nausea Only       Objective:    BP 134/79 (BP Location: Left Arm, Patient Position: Sitting, Cuff Size: Normal)   Pulse 74   Ht 6\' 4"  (1.93 m)   Wt 253 lb 12.8 oz (115.1 kg)   SpO2 98%   BMI 30.89 kg/m  Wt Readings from Last 3 Encounters:  10/11/22 253 lb 12.8  oz (115.1 kg)  06/19/22 255 lb (115.7 kg)  04/26/22 255 lb 12.8 oz (116 kg)    Physical Exam Vitals and nursing note reviewed.  Constitutional:      Appearance: He is well-developed.  HENT:     Head: Atraumatic.     Jaw: Malocclusion present.  Cardiovascular:     Rate and Rhythm: Normal rate and regular rhythm.     Heart sounds: Normal heart sounds. No murmur heard.    No friction rub. No gallop.  Pulmonary:     Effort: Pulmonary effort is normal. No tachypnea or respiratory distress.     Breath sounds: Normal breath sounds. No decreased breath sounds, wheezing, rhonchi or rales.  Chest:     Chest wall: No tenderness.  Abdominal:     General: Bowel sounds are normal.     Palpations: Abdomen is soft.  Musculoskeletal:        General: Normal range of motion.     Cervical back: Normal range of motion.  Skin:    General: Skin is warm and dry.  Neurological:     Mental Status: He is alert and oriented to person, place, and time.     Coordination: Coordination normal.  Psychiatric:        Behavior: Behavior normal. Behavior is cooperative.        Thought Content: Thought content normal.         Judgment: Judgment normal.          Patient has been counseled extensively about nutrition and exercise as well as the importance of adherence with medications and regular follow-up. The patient was given clear instructions to go to ER or return to medical center if symptoms don't improve, worsen or new problems develop. The patient verbalized understanding.   Follow-up: Return in about 3 months (around 01/11/2023).   [name], FNP-BC [facility] [place], [place] [phone]   10/11/2022, 2:10 PM

## 2022-10-12 ENCOUNTER — Other Ambulatory Visit: Payer: Self-pay

## 2022-10-12 ENCOUNTER — Telehealth: Payer: Self-pay

## 2022-10-12 LAB — CBC WITH DIFFERENTIAL/PLATELET
Basophils Absolute: 0 10*3/uL (ref 0.0–0.2)
Basos: 0 %
EOS (ABSOLUTE): 0.1 10*3/uL (ref 0.0–0.4)
Eos: 2 %
Hematocrit: 53.4 % — ABNORMAL HIGH (ref 37.5–51.0)
Hemoglobin: 17.6 g/dL (ref 13.0–17.7)
Immature Grans (Abs): 0 10*3/uL (ref 0.0–0.1)
Immature Granulocytes: 1 %
Lymphocytes Absolute: 1.8 10*3/uL (ref 0.7–3.1)
Lymphs: 34 %
MCH: 29.7 pg (ref 26.6–33.0)
MCHC: 33 g/dL (ref 31.5–35.7)
MCV: 90 fL (ref 79–97)
Monocytes Absolute: 0.5 10*3/uL (ref 0.1–0.9)
Monocytes: 9 %
Neutrophils Absolute: 2.9 10*3/uL (ref 1.4–7.0)
Neutrophils: 54 %
Platelets: 190 10*3/uL (ref 150–450)
RBC: 5.92 x10E6/uL — ABNORMAL HIGH (ref 4.14–5.80)
RDW: 12.7 % (ref 11.6–15.4)
WBC: 5.4 10*3/uL (ref 3.4–10.8)

## 2022-10-12 LAB — CMP14+EGFR
ALT: 19 IU/L (ref 0–44)
AST: 19 IU/L (ref 0–40)
Albumin/Globulin Ratio: 1.6 (ref 1.2–2.2)
Albumin: 4.1 g/dL (ref 3.8–4.9)
Alkaline Phosphatase: 89 IU/L (ref 44–121)
BUN/Creatinine Ratio: 12 (ref 9–20)
BUN: 12 mg/dL (ref 6–24)
Bilirubin Total: 1 mg/dL (ref 0.0–1.2)
CO2: 21 mmol/L (ref 20–29)
Calcium: 9.6 mg/dL (ref 8.7–10.2)
Chloride: 97 mmol/L (ref 96–106)
Creatinine, Ser: 1.02 mg/dL (ref 0.76–1.27)
Globulin, Total: 2.5 g/dL (ref 1.5–4.5)
Glucose: 235 mg/dL — ABNORMAL HIGH (ref 70–99)
Potassium: 4.1 mmol/L (ref 3.5–5.2)
Sodium: 136 mmol/L (ref 134–144)
Total Protein: 6.6 g/dL (ref 6.0–8.5)
eGFR: 85 mL/min/{1.73_m2} (ref >59)

## 2022-10-12 LAB — LIPID PANEL
Chol/HDL Ratio: 3.7 ratio (ref 0.0–5.0)
Cholesterol, Total: 190 mg/dL (ref 100–199)
HDL: 52 mg/dL (ref >39)
LDL Chol Calc (NIH): 114 mg/dL — ABNORMAL HIGH (ref 0–99)
Triglycerides: 136 mg/dL (ref 0–149)
VLDL Cholesterol Cal: 24 mg/dL (ref 5–40)

## 2022-10-12 LAB — THYROID PANEL WITH TSH
Free Thyroxine Index: 6.6 — ABNORMAL HIGH (ref 1.2–4.9)
T3 Uptake Ratio: 41 % — ABNORMAL HIGH (ref 24–39)
T4, Total: 16.1 ug/dL — ABNORMAL HIGH (ref 4.5–12.0)
TSH: 0.443 u[IU]/mL — ABNORMAL LOW (ref 0.450–4.500)

## 2022-10-12 LAB — PSA: Prostate Specific Ag, Serum: 1.3 ng/mL (ref 0.0–4.0)

## 2022-10-12 NOTE — Telephone Encounter (Signed)
Pharmacy notified-prescription processed successfully. MyChart message sent to patient.

## 2022-10-12 NOTE — Telephone Encounter (Signed)
A prior authorization request for Glipizide 10mg  1 tab twice daily has been submitted to ins via CoverMyMeds Key: [id number]

## 2022-10-12 NOTE — Telephone Encounter (Signed)
Approved through 10/12/2023.

## 2022-11-06 NOTE — Progress Notes (Signed)
Advanced Heart Failure Clinic Note   PCP: [name], NP HF Cardiologist: Dr. [name]  HPI: 57 y.o. male with  HTN, poorly-controlled DM2, acromegaly and systolic HF due to NICM.   Admitted 7/20 with HTN urgency and acute systolic HF. ECG with LBBB of uncertain duration. Echo EF 25-30%.Cardiac cath showed normal coronaries with well compensated filling pressures after diuresis.   He was started on GDMT including carvedilol 6.25 bid, Entresto 49/51 bid, spironolactone 12.5 daily. He did well for several days but then developed severe fatigue. I saw him in Clinic on 12/27/18. He was in bigeminy. I stopped his b-blocker and started amio 200 bid.K 5.0 Mg 1.7  Echo 9/21: EF 30-35%, RV ok.  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 Referred to Dr. [name]. Underwent transphenoidal pituitary resection in 12/21.  Echo 3/22: EF 20-25% + septal dyssynchrony RV ok.   Echo 5/23 EF 25-30% RV mildly HK  Follow up March 10, 2023. Wife passed away from HF on May 25, 2021. NYHA II-early III, volume ok. Planning for CRT when DM2 better controlled, referred back to Endo.  Today he returns for HF follow up. Now living in [place]. Feels very tired. Gets SOB with mild exertion. He thinks part of it is still grieving over his wife. No CP, edema, orthopnea or PND. Recent labwork with PCP looked ok with hgb 17.6, A1c 13.8 and hyperthyroidism (TSH 0.443, T4 16.1).     Cardiac Studies - Echo (5/23): EF 25-30% RV mildly HK  - Echo (3/22): EF 20-25%, + septal dyssynchrony, RV ok  - Zio 5/23: 8 runs NSVT, 3.5% PVCs   - Echo 9/21: EF 30-35%, RV ok.  - cMRI (10/21/19): difficult study due to respiratory artifact and frequent ectopy, LVEF 20-25%, di hypokinesis with basal to mid inferolateral akinesis, normal RVEF and function, dense basal to mid inferolateral scar. This looked most consistent with a prior MI in the LCx territory.   - Echo (3/21): EF 25% RV ok.   - Zio (10/20): 4.8%, PVCs  - Echo (11/19): EF  ~35-40% with severe dyssynchrony due to LBBB and PVCs   SH:  Social History   Socioeconomic History   Marital status: Widowed    Spouse name: [name]   Number of children: 1   Years of education: Not on file   Highest education level: Not on file  Occupational History   Occupation: Minister/carpenter/musician  Tobacco Use   Smoking status: Former   Smokeless tobacco: Never  Building services engineer Use: Never used  Substance and Sexual Activity   Alcohol use: Never   Drug use: Never   Sexual activity: Yes  Other Topics Concern   Not on file  Social History Narrative   Not on file   Social Determinants of Health   Financial Resource Strain: Not on file  Food Insecurity: No Food Insecurity (02/16/2022)   Hunger Vital Sign    Worried About Running Out of Food in the Last Year: Never true    Ran Out of Food in the Last Year: Never true  Transportation Needs: No Transportation Needs (02/16/2022)   PRAPARE - Administrator, Civil Service (Medical): No    Lack of Transportation (Non-Medical): No  Physical Activity: Not on file  Stress: Not on file  Social Connections: Not on file  Intimate Partner Violence: Not on file    FH:  Family History  Problem Relation Age of Onset   Hypertension Mother  Heart attack Mother    Hypertension Father    Hypertension Sister    Hypertension Brother     Past Medical History:  Diagnosis Date   Acromegaly (HCC)    CHF (congestive heart failure) (HCC)    Essential hypertension    Hypertensive emergency 12/15/2018   Nonischemic cardiomyopathy (HCC)    Obesity    Pituitary macroadenoma (HCC)    Systolic heart failure (HCC)    Type 2 diabetes mellitus with complication (HCC)     Current Outpatient Medications  Medication Sig Dispense Refill   amiodarone (PACERONE) 200 MG tablet Take 1 tablet by mouth once daily 90 tablet 0   glipiZIDE (GLUCOTROL) 10 MG tablet Take 1 tablet (10 mg total) by mouth 2 (two) times daily before a  meal. 90 tablet 1   insulin glargine (LANTUS SOLOSTAR) 100 UNIT/ML Solostar Pen Inject 22 Units into the skin daily. 15 mL PRN   Insulin Pen Needle (PEN NEEDLES) 31G X 5 MM MISC 1 each by Does not apply route daily. 100 each 11   sacubitril-valsartan (ENTRESTO) 24-26 MG Take 1 tablet by mouth 2 (two) times daily. 180 tablet 3   spironolactone (ALDACTONE) 25 MG tablet Take 1 tablet (25 mg total) by mouth daily. 90 tablet 1   No current facility-administered medications for this encounter.   There were no vitals taken for this visit.  Wt Readings from Last 3 Encounters:  10/11/22 115.1 kg (253 lb 12.8 oz)  06/19/22 115.7 kg (255 lb)  04/26/22 116 kg (255 lb 12.8 oz)   PHYSICAL EXAM: General:  Well appearing. No resp difficulty HEENT: normal Neck: supple. no JVD. Carotids 2+ bilat; no bruits. No lymphadenopathy or thryomegaly appreciated. Cor: PMI nondisplaced. Regular rate & rhythm. No rubs, gallops or murmurs. Lungs: clear Abdomen: soft, nontender, nondistended. No hepatosplenomegaly. No bruits or masses. Good bowel sounds. Extremities: no cyanosis, clubbing, rash, edema Neuro: alert & orientedx3, cranial nerves grossly intact. moves all 4 extremities w/o difficulty. Affect pleasant  ECG (personally reviewed): NSR LBBB, 2 PVCs 84 bpm, QRS 182 msec  ASSESSMENT & PLAN:  1. Chronic systolic HF - diagnosed 7/20 EF ~25-30%. NICM - cath 7/20 with normal coronaries and compensated hemodynamics - suspect he likely has LBBB +/- PVC-induced CM  - Zio patch 10/20 with 4.8% PVC  - Echo 09/03/19: EF ~25% with severe LV dyssynchrony (no improvement with PVC suppression) - cMRI 5/21 EF 20-25% suggestive of PVC/LBBB CM but presence of ? Inferolateral scar is puzzling.  - Echo 9/21: EF 30-35%, severe decrease LV function with dyssynchrony, RV normal - Echo 08/23/20: EF 20-25% + septal dyssynchrony RV ok. - Echo 5/23: EF 25-30% - Zio 5/23: 8 runs NSVT, 3.5% PVCs  - PVCs mostly suppressed EF still  down. Suspect LBBB CM - Seen by EP for consideration of CRT, felt due to lack of significant symptoms and no activity limitations, low risk for cardiac arrest and would defer device at this time pending ongoing EF improvement. - Worse NYHA III - suspect more due to metabolic abnormalities  - Continue Entresto 24/26 mg bid (did not feel well on higher dose). Refuses to titrate - Continue spiro 25 mg daily. - Off Coreg with severe fatigue. (Will not re-challenge with Toprol given on-going fatigue, though suspect symptoms in part related to uncontrolled DM2.) - Off Farxiga due to intolerance (nausea) will not reattempt currently until A1c bettrer controlled - Given persistent LV dysfunction and NYHA III symptoms I think he will benefit from CRT  but will wait until DM2 better controlled first.  - Return in 3 months with echo   2. Frequent PVCs - Amio decreased to 100 daily.  - zio 10/20 4.8%  - Zio 5/23 3.5% PVCs - Can consider switch to mexilitene as needed.  - LFTs ok 5/23 and TSH ok 11/23. - 2 PVCs on ECG today.  3. HTN - BP up today. Refuses med titration  4. LBBB - Discussion as above. Has seen EP and CRT consideration deferred.  - Given persistent LV dysfunction and NYHA II-III symptoms, I think he will benefit from CRT but will wait until DM2 better controlled first (would likel to see A1c < 10) - QRS 182 msec on ECG today.  5. Poorly controlled DM2 - Followed by Endo - Last HGBA1c 15 -> 13.8 - Asked him to f/iu again with Dr. [name] (I reached out to her for him)   6. Hyperthyroidism - suspect related to amio - would ask Dr, Wyonia Hough to help manage. Likely needs methimazole   6. Acromegaly - has growth hormone excess - S/p  transphenoid pituitary resection surgery & sinus septoplasty 12/21  7. Fatigue/erythyrocytosis - check sleep study  Arvilla Meres, MD  10:53 AM

## 2022-11-07 ENCOUNTER — Encounter (HOSPITAL_COMMUNITY): Payer: Self-pay | Admitting: Internal Medicine

## 2022-11-07 ENCOUNTER — Ambulatory Visit (HOSPITAL_COMMUNITY)
Admission: RE | Admit: 2022-11-07 | Discharge: 2022-11-07 | Disposition: A | Discharge status: Home / Self Care | Payer: BLUE CROSS/BLUE SHIELD | Source: Ambulatory Visit | Attending: Internal Medicine | Admitting: Internal Medicine

## 2022-11-07 VITALS — BP 138/100 | HR 82 | Wt 257.0 lb

## 2022-11-07 DIAGNOSIS — E0590 Thyrotoxicosis, unspecified without thyrotoxic crisis or storm: Secondary | ICD-10-CM | POA: Diagnosis not present

## 2022-11-07 DIAGNOSIS — Z79899 Other long term (current) drug therapy: Secondary | ICD-10-CM | POA: Diagnosis not present

## 2022-11-07 DIAGNOSIS — I447 Left bundle-branch block, unspecified: Secondary | ICD-10-CM | POA: Diagnosis not present

## 2022-11-07 DIAGNOSIS — I10 Essential (primary) hypertension: Secondary | ICD-10-CM | POA: Diagnosis not present

## 2022-11-07 DIAGNOSIS — Z794 Long term (current) use of insulin: Secondary | ICD-10-CM | POA: Insufficient documentation

## 2022-11-07 DIAGNOSIS — I493 Ventricular premature depolarization: Secondary | ICD-10-CM

## 2022-11-07 DIAGNOSIS — I428 Other cardiomyopathies: Secondary | ICD-10-CM | POA: Insufficient documentation

## 2022-11-07 DIAGNOSIS — E1165 Type 2 diabetes mellitus with hyperglycemia: Secondary | ICD-10-CM | POA: Diagnosis not present

## 2022-11-07 DIAGNOSIS — I110 Hypertensive heart disease with heart failure: Secondary | ICD-10-CM | POA: Diagnosis not present

## 2022-11-07 DIAGNOSIS — I5022 Chronic systolic (congestive) heart failure: Secondary | ICD-10-CM | POA: Diagnosis not present

## 2022-11-07 DIAGNOSIS — Z8249 Family history of ischemic heart disease and other diseases of the circulatory system: Secondary | ICD-10-CM | POA: Insufficient documentation

## 2022-11-07 DIAGNOSIS — Z7984 Long term (current) use of oral hypoglycemic drugs: Secondary | ICD-10-CM

## 2022-11-07 DIAGNOSIS — E220 Acromegaly and pituitary gigantism: Secondary | ICD-10-CM | POA: Diagnosis not present

## 2022-11-07 NOTE — Patient Instructions (Signed)
There has been no changes to your medications.  Your provider has recommended that you have a home sleep study ([facility] Test).  We have provided you with the equipment in our office today. Please go ahead and download the app. DO NOT OPEN OR TAMPER WITH THE BOX UNTIL WE ADVISE YOU TO DO SO. Once insurance has approved the test our office will call you with PIN number and approval to proceed with testing. Once you have completed the test you just dispose of the equipment, the information is automatically uploaded to [place] via blue-tooth technology. If your test is positive for sleep apnea and you need a home CPAP machine you will be contacted by Dr Norris Cross office [facility]) to set this up.   Your physician has requested that you have an echocardiogram. Echocardiography is a painless test that uses sound waves to create images of your heart. It provides your doctor with information about the size and shape of your heart and how well your heart's chambers and valves are working. This procedure takes approximately one hour. There are no restrictions for this procedure. Please do NOT wear cologne, perfume, aftershave, or lotions (deodorant is allowed). Please arrive 15 minutes prior to your appointment time.  Your physician recommends that you schedule a follow-up appointment in: 3-4 months with an echocardiogram  If you have any questions or concerns before your next appointment please send [place] a message through [place] or call our office at [phone].    TO LEAVE A MESSAGE FOR THE NURSE SELECT OPTION 2, PLEASE LEAVE A MESSAGE INCLUDING: YOUR NAME DATE OF BIRTH CALL BACK NUMBER REASON FOR CALL**this is important as we prioritize the call backs  YOU WILL RECEIVE A CALL BACK THE SAME DAY AS LONG AS YOU CALL BEFORE 4:00 PM  At the Advanced [facility] Failure Clinic, you and your health needs are our priority. As part of our continuing mission to provide you with exceptional heart care, we have created  designated Provider Care Teams. These Care Teams include your primary Cardiologist (physician) and Advanced Practice Providers (APPs- Physician Assistants and Nurse Practitioners) who all work together to provide you with the care you need, when you need it.   You may see any of the following providers on your designated Care Team at your next follow up: Dr Arvilla Meres Dr Marca Ancona Dr. Marcos Eke, NP Robbie Lis, [place] [facility] [place], [place] Brynda Peon, NP Karle Plumber, PharmD   Please be sure to bring in all your medications bottles to every appointment.    Thank you for choosing [facility]

## 2022-11-07 NOTE — Progress Notes (Signed)
Height:     Weight: BMI:  Today's Date:  STOP BANG RISK ASSESSMENT S (snore) Have you been told that you snore?     YES   T (tired) Are you often tired, fatigued, or sleepy during the day?   YES  O (obstruction) Do you stop breathing, choke, or gasp during sleep? NO   P (pressure) Do you have or are you being treated for high blood pressure? YES   B (BMI) Is your body index greater than 35 kg/m? NO   A (age) Are you 59 years old or older? YES   N (neck) Do you have a neck circumference greater than 16 inches?   NO   G (gender) Are you a male? YES   TOTAL STOP/BANG "YES" ANSWERS 5                                                                       For Office Use Only              Procedure Order Form    YES to 3+ Stop Bang questions OR two clinical symptoms - patient qualifies for WatchPAT (CPT 95800)      Clinical Notes: Will consult Sleep Specialist and refer for management of therapy due to patient increased risk of Sleep Apnea. Ordering a sleep study due to the following two clinical symptoms: Excessive daytime sleepiness G47.10 / Gastroesophageal reflux K21.9 / Nocturia R35.1 / Morning Headaches G44.221 / Difficulty concentrating R41.840 / Memory problems or poor judgment G31.84 / Personality changes or irritability R45.4 / Loud snoring R06.83 / Depression F32.9 / Unrefreshed by sleep G47.8 / Impotence N52.9 / History of high blood pressure R03.0 / Insomnia G47.00       

## 2022-11-07 NOTE — Progress Notes (Signed)
ITAMAR home sleep study given to patient, all instructions explained, waiver signed, and CLOUDPAT registration complete.  

## 2022-11-11 ENCOUNTER — Encounter (INDEPENDENT_AMBULATORY_CARE_PROVIDER_SITE_OTHER): Payer: BLUE CROSS/BLUE SHIELD | Admitting: Cardiology

## 2022-11-11 DIAGNOSIS — G4733 Obstructive sleep apnea (adult) (pediatric): Secondary | ICD-10-CM

## 2022-11-14 NOTE — Procedures (Signed)
SLEEP STUDY REPORT Patient Information Study Date: 11/11/2022 Patient Name: Kevin Hampton Patient ID: [id number] Birth Date: [birth year] Age: 57 Gender: Male BMI: 33.1 (W=258 lb, H=6' 2'') Referring Physician: Nicholes Mango, MD  TEST DESCRIPTION: Home sleep apnea testing was completed using the WatchPat, a Type 1 device, utilizing peripheral arterial tonometry (PAT), chest movement, actigraphy, pulse oximetry, pulse rate, body position and snore. AHI was calculated with apnea and hypopnea using valid sleep time as the denominator. RDI includes apneas, hypopneas, and RERAs. The data acquired and the scoring of sleep and all associated events were performed in accordance with the recommended standards and specifications as outlined in the AASM Manual for the Scoring of Sleep and Associated Events 2.2.0 (2015).   FINDINGS:   1. Mild Obstructive Sleep Apnea with AHI 7.4/hr overall and moderate during REM sleep with REM AHI 15.7/hr.   2. No Central Sleep Apnea with pAHIc 0.8/hr.   3. Oxygen desaturations as low as 84%.   4. Mild snoring was present. O2 sats were < 88% for 2.3 min.   5. Total sleep time was 7 hrs and 33 min.   6. 11% of total sleep time was spent in REM sleep.   7. Shortened sleep onset latency at 6 min.   8. Prolonged REM sleep onset latency at 198 min.   9. Total awakenings were 8.  10. Arrhythmia detection:  None.  DIAGNOSIS: Mild Obstructive Sleep Apnea (G47.33)  RECOMMENDATIONS:   1.  Clinical correlation of these findings is necessary.  The decision to treat obstructive sleep apnea (OSA) is usually based on the presence of apnea symptoms or the presence of associated medical conditions such as Hypertension, Congestive Heart Failure, Atrial Fibrillation or Obesity.  The most common symptoms of OSA are snoring, gasping for breath while sleeping, daytime sleepiness and fatigue.   2.  Initiating apnea therapy is recommended given the presence of symptoms and/or associated  conditions. Recommend proceeding with one of the following:     a.  Auto-CPAP therapy with a pressure range of 5-20cm H2O.     b.  An oral appliance (OA) that can be obtained from certain dentists with expertise in sleep medicine.  These are primarily of use in non-obese patients with mild and moderate disease.     c.  An ENT consultation which may be useful to look for specific causes of obstruction and possible treatment options.     d.  If patient is intolerant to PAP therapy, consider referral to ENT for evaluation for hypoglossal nerve stimulator.   3.  Close follow-up is necessary to ensure success with CPAP or oral appliance therapy for maximum benefit.  4.  A follow-up oximetry study on CPAP is recommended to assess the adequacy of therapy and determine the need for supplemental oxygen or the potential need for Bi-level therapy.  An arterial blood gas to determine the adequacy of baseline ventilation and oxygenation should also be considered.  5.  Healthy sleep recommendations include:  adequate nightly sleep (normal 7-9 hrs/night), avoidance of caffeine after noon and alcohol near bedtime, and maintaining a sleep environment that is cool, dark and quiet.  6.  Weight loss for overweight patients is recommended.  Even modest amounts of weight loss can significantly improve the severity of sleep apnea.  7.  Snoring recommendations include:  weight loss where appropriate, side sleeping, and avoidance of alcohol before bed.  8.  Operation of motor vehicle should be avoided when sleepy.  Signature: Armanda Magic, MD;  [facility]; Diplomat, Biomedical engineer of Sleep Medicine Electronically Signed: 11/14/2022 10:38:49 AM

## 2022-11-16 ENCOUNTER — Ambulatory Visit: Payer: BLUE CROSS/BLUE SHIELD | Attending: Internal Medicine

## 2022-11-16 DIAGNOSIS — G4733 Obstructive sleep apnea (adult) (pediatric): Secondary | ICD-10-CM

## 2022-11-20 ENCOUNTER — Telehealth: Payer: Self-pay

## 2022-11-20 NOTE — Telephone Encounter (Signed)
Left VM, per DPR, for patient to return my call for sleep study results and recommendations. 

## 2022-11-20 NOTE — Telephone Encounter (Signed)
-----   Message from Quintella Reichert, MD sent at 11/14/2022 10:40 AM EDT ----- Please let patient know that they have sleep apnea and recommend treating with CPAP.  Please order an auto CPAP from 4-15cm H2O with heated humidity and mask of choice.  Order overnight pulse ox on CPAP.  Followup with me in 6 weeks.

## 2022-11-20 NOTE — Telephone Encounter (Signed)
Patient notified of sleep study results and recommendations. Patient stated he is declining CPAP treatment.He does not want to discuss any further at the moment.

## 2022-11-22 ENCOUNTER — Other Ambulatory Visit: Payer: Self-pay

## 2022-11-22 DIAGNOSIS — G4733 Obstructive sleep apnea (adult) (pediatric): Secondary | ICD-10-CM

## 2022-12-04 ENCOUNTER — Telehealth: Payer: Self-pay

## 2022-12-04 NOTE — Telephone Encounter (Signed)
Per Dr. Elvera Lennox she can see this patient on Thursday 12/07/22 at 11:20am.  I spoke with the patient and he agrees. Please place pt on the schedule

## 2022-12-07 ENCOUNTER — Telehealth: Payer: Self-pay

## 2022-12-07 ENCOUNTER — Encounter: Payer: Self-pay | Admitting: Internal Medicine

## 2022-12-07 ENCOUNTER — Ambulatory Visit (INDEPENDENT_AMBULATORY_CARE_PROVIDER_SITE_OTHER): Payer: BLUE CROSS/BLUE SHIELD | Admitting: Internal Medicine

## 2022-12-07 ENCOUNTER — Other Ambulatory Visit (HOSPITAL_COMMUNITY): Payer: Self-pay

## 2022-12-07 ENCOUNTER — Other Ambulatory Visit (INDEPENDENT_AMBULATORY_CARE_PROVIDER_SITE_OTHER): Payer: BLUE CROSS/BLUE SHIELD

## 2022-12-07 VITALS — BP 130/80 | HR 85 | Ht 76.0 in | Wt 257.4 lb

## 2022-12-07 DIAGNOSIS — E1159 Type 2 diabetes mellitus with other circulatory complications: Secondary | ICD-10-CM

## 2022-12-07 DIAGNOSIS — E1165 Type 2 diabetes mellitus with hyperglycemia: Secondary | ICD-10-CM | POA: Diagnosis not present

## 2022-12-07 DIAGNOSIS — E220 Acromegaly and pituitary gigantism: Secondary | ICD-10-CM

## 2022-12-07 DIAGNOSIS — D352 Benign neoplasm of pituitary gland: Secondary | ICD-10-CM

## 2022-12-07 DIAGNOSIS — Z7984 Long term (current) use of oral hypoglycemic drugs: Secondary | ICD-10-CM

## 2022-12-07 DIAGNOSIS — E0590 Thyrotoxicosis, unspecified without thyrotoxic crisis or storm: Secondary | ICD-10-CM

## 2022-12-07 DIAGNOSIS — Z794 Long term (current) use of insulin: Secondary | ICD-10-CM

## 2022-12-07 DIAGNOSIS — Z7985 Long-term (current) use of injectable non-insulin antidiabetic drugs: Secondary | ICD-10-CM

## 2022-12-07 LAB — T3, FREE: T3, Free: 3.9 pg/mL (ref 2.3–4.2)

## 2022-12-07 LAB — TSH: TSH: 0.53 u[IU]/mL (ref 0.35–5.50)

## 2022-12-07 LAB — CORTISOL: Cortisol, Plasma: 6 ug/dL

## 2022-12-07 LAB — T4, FREE: Free T4: 1.64 ng/dL — ABNORMAL HIGH (ref 0.60–1.60)

## 2022-12-07 MED ORDER — LANTUS SOLOSTAR 100 UNIT/ML ~~LOC~~ SOPN: 30.0000 [IU] | PEN_INJECTOR | Freq: Every day | SUBCUTANEOUS | 3 refills | Status: DC

## 2022-12-07 MED ORDER — SEMAGLUTIDE(0.25 OR 0.5MG/DOS) 2 MG/3ML ~~LOC~~ SOPN: 0.5000 mg | PEN_INJECTOR | SUBCUTANEOUS | 3 refills | Status: DC

## 2022-12-07 NOTE — Patient Instructions (Addendum)
Please continue: - Glipizide 10 mg 2x a day before meals  Increase: - Lantus to 30 units daily at bedtime  Start: - Please start Ozempic 0.25 mg weekly in a.m. (for example on Sunday morning) x 2-4 weeks, then increase to 0.5 mg weekly in a.m. if no nausea or hypoglycemia.  Please look up Lanreotide (under skin) and Somatostatin (in the muscle). Let me know if we can try this.  Please have another pituitary MRI.  Please return in  3 months with your sugar log.

## 2022-12-07 NOTE — Telephone Encounter (Signed)
Patient Advocate Encounter   Received notification from [facility] that prior authorization is required for Ozempic  Submitted: 12/07/22 Key [id number]  Status is pending

## 2022-12-07 NOTE — Progress Notes (Signed)
Patient ID: Kevin Hampton, male   DOB: 21-Dec-1965, 57 y.o.   MRN: [id number]   HPI  Kevin Hampton is a 57 y.o.-year-old male, initially referred by his cardiologist, Dr. [name], returning for follow-up for acromegaly and thyrotoxicosis. Also, now DM2, insulin-dependent, very uncontrolled. Last visit 7 months ago, prev. visit 1 year and 7 months prior.  Wife passed away May 25, 2021.  [place] moved to [place] 3 [place]. Because of this, he was not able to follow up here as recommended.  Interim history: He feels well after his transsphenoidal surgery - joint pain, reflux, constipation resolved.  He had shoulder and knee pain, heat intolerance - resolved since last visit.he continues to have very little taste. No smell. No HAs. He has very uncontrolled DM.  Insulin was repeatedly recommended by both PCP and myself, but he refused to stay on this in the past. He was in the ED with hyperglycemia 06/19/2022 - glu >600, which improved with fluids.  In the last 2 months, he did start Lantus, which he mentions is expensive for him. He had blurry vision >> improved now, after starting Lantus. No increased urination and thirst. He did not have a new pituitary MRI as recommended at last OV.  Upon questioning, this was due to finances.  Acromegaly: - suspected in 11/2019 based on elevated growth hormone and pituitary adenoma diagnosed in 12/2019 on MRI.  Reviewed history: Pt describes that in late 30s, he noticed a change in his appearance: his jaw and forehead started to protrude.  He also has large fingers but he was quite obese and lost approximately 100 pounds approximately 10 to 15 years ago so he thought this was the cause why his fingers were larger.  He also had problems with increased spacing between his lower incisors. His son noticed a change in appearance and advised the patient to have his growth hormone checked.  Pt. was seen by Dr. [name] in 10/2019 and a  growth hormone was checked at that time.  This returned elevated, so they pituitary MRI was ordered, but this was not done before last visit.  She was referred to endocrinology for investigation for acromegaly.  At our first visit, he described: - + increase in ring size - + widening of spaces between teeth - + developing of an underbite - + deepening of the voice - no increase in shoe size - no headaches - + Increase sweating - no joint pain, only occasionally in fingers - + occasional constipation - + now only rarely snoring, but had OSA before losing weight - repeated sleep study >> normal - + weight loss - + fatigue, low energy - + cold intolerance only at night, occasionally - no vision loss, no double vision, he prev. wore readers  He also has: - cardiomegaly with cardiomyopathy and CHF (EF 20 to 25%, per review of his most recent cardiac MRI report) - diabetes type 2 (dx'ed 2012), uncontrolled  He had transsphenoidal surgery for acromegaly with Dr. Maisie Fus on 05/26/2020. This was originally planned for 04/15/2020, but he had COVID-19 at that time and the surgery was postponed.  He felt well after the surgery, with no complaints. He did not develop DI or SIADH after the surgery (reviewed sodium levels and these were normal). Also, there is no significant congestion. However, he does report loss of taste and smell, initially with his COVID-19 infection, after which he started to recover, but lost these again after the surgery.  He had steroids after the treatment, hydrocortisone, 2 tablets in a.m. and 1 in p.m., stopped 3 weeks after surgery.  Unfortunately, the pathology of his pituitary tumor was very minimalistic (there was no staining for Ki67 and somatostatin receptors and no mention of granulation status): FINAL MICROSCOPIC DIAGNOSIS:   A. PITUITARY GLAND, TUMOR, RESECTION:  -  Pituitary adenoma   GROSS DESCRIPTION:  Received fresh are fragments of soft tan-pink tissue  measuring 1.8 x 1.7  x 0.3 cm in aggregate.  The specimen is submitted in toto.  (GRP 05/26/2020)   I requested stainings for Ki-67, somatostatin receptors, growth, granularity and prolactin, but only the Ki-67 could be run by the pathology department.  This was <1%.  He saw ENT - Dr Annalee Genta (note reviewed from 06/22/2020).  Reviewed pertinent labs: He had high LH, FSH, ACTH, with normal cortisol and testosterone. He had very high IGF-I and growth hormone. we did not perform a growth hormone suppression test due to the high suspicion for acromegaly: Component     Latest Ref Rng & Units 11/24/2019 (preop)  IGF-I, LC/MS     50 - 317 ng/mL 1,282 (H)  Z-Score (Male)     -2.0 - 2 SD 6.0 (H)  TSH     0.35 - 4.50 uIU/mL 0.84  T4,Free(Direct)     0.60 - 1.60 ng/dL 1.61 (H)  Triiodothyronine,Free,Serum     2.3 - 4.2 pg/mL 4.3 (H)  Growth Hormone     < OR = 7.1 ng/mL 48.9 (H)  LH     1.50 - 9.30 mIU/mL 13.07 (H)  FSH     1.4 - 18.1 mIU/ML 53.9 (H)  Prolactin     2.0 - 18.0 ng/mL 14.3  Cortisol, Plasma     ug/dL 09.6  E454 ACTH     6 - 50 pg/mL 60 (H)  Testosterone, total     264.0 - 916.0 ng/dL 098.1  1/91/4782: Growth hormone 27.9 (0-10)  Component     Latest Ref Rng & Units 06/24/2020 (postop)          Sodium     135 - 145 mEq/L 138  Potassium     3.5 - 5.1 mEq/L 5.3 (H)  Chloride     96 - 112 mEq/L 100  CO2     19 - 32 mEq/L 30  Glucose     70 - 99 mg/dL 956 (H)  BUN     6 - 23 mg/dL 18  Creatinine     2.13 - 1.50 mg/dL 0.86  GFR     >57.84 mL/min 69.86  Calcium     8.4 - 10.5 mg/dL 69.6 (H)  IGF-I, LC/MS     50 - 317 ng/mL 1,094 (H)  Z-Score (Male)     -2.0 - 2 SD 5.5 (H)  Extra tube recieved        Specimen type recieved      Serum;  TSH     0.35 - 4.50 uIU/mL 0.16 (L)  T4,Free(Direct)     0.60 - 1.60 ng/dL 2.95 (H)  Triiodothyronine,Free,Serum     2.3 - 4.2 pg/mL 4.7 (H)  Growth Hormone     < OR = 7.1 ng/mL 7.0  Cortisol, Plasma     ug/dL 28.4   X324 ACTH     6 - 50 pg/mL 50   At last visit, IGF-I was still elevated, only slightly decreased, GH was normal: Component     Latest Ref Rng & Units 09/23/2020  IGF-I, LC/MS     50 - 317 ng/mL 882 (H)  Z-Score (Male)     -2.0 - 2 SD 4.8 (H)  Glucose     65 - 99 mg/dL 865 (H)  TSH     7.84 - 4.50 uIU/mL 0.47  T4,Free(Direct)     0.60 - 1.60 ng/dL 6.96 (H)  Triiodothyronine,Free,Serum     2.3 - 4.2 pg/mL 4.5 (H)  Growth Hormone     < OR = 7.1 ng/mL 5.6  TSI     <140 % baseline <89   At last OV: Component     Latest Ref Rng 04/26/2022  Growth Hormone     < OR = 7.1 ng/mL 12.2 (H)   Cortisol, Plasma     ug/dL 29.5   IGF-I, LC/MS     50 - 317 ng/mL 942 (H)   Z-Score (Male)     -2.0 - 2.0 SD 5.1 (H)   Prolactin     2.0 - 18.0 ng/mL 8.2   C206 ACTH     6 - 50 pg/mL 34    Pituitary MRI (01/09/2020) showed a macroadenoma: 2.5 cm macroadenoma with suprasellar extension and abutment of the inferior optic chiasm. Mild splaying of the pre-chiasmatic optic nerves.   Sella floor defect with extension of tumor into the right sphenoid sinus.   Bilateral cavernous sinus involvement without significant encasement or luminal narrowing of the intracavernous ICAs.   Nonspecific supratentorial white matter T2 hyperintensities. Differential includes minimal chronic microvascular ischemic changes versus post infectious/inflammatory sequela.   Osseous prominence of the bilateral supraorbital margin, anterior maxillary walls and mandibular condyles likely reflects sequela of acromegaly.      Pituitary MRI (08/03/2020): 1. Postsurgical changes from transsphenoidal pituitary tumor resection with significant interval decrease in size of the pituitary tumor. A hypoenhancing component on the left side of the sella measuring approximately 11 x 10 x 5 mm may represent residual tumor. 2. There is a nonenhancing cystic like component on the right side of the sella measuring  approximately 1.5 x 0.8 x 0.8 cm with low signal on T2, likely related to blood products. 3. Persistent masslike structure along the sphenoid septum suggestive of inferior extension of pituitary tumor.  Thyrotoxicosis: He had a low TSH with high free thyroid hormones: Lab Results  Component Value Date   TSH 0.443 (L) 10/11/2022   TSH 0.48 04/26/2022   TSH 0.414 10/11/2021   TSH 0.47 09/23/2020   TSH 0.16 (L) 06/24/2020   TSH 0.84 11/24/2019   TSH 0.955 10/28/2019   TSH 0.845 05/01/2019   TSH 0.789 12/15/2018   FREET4 1.76 (H) 04/26/2022   FREET4 1.92 (H) 10/11/2021   FREET4 1.89 (H) 09/23/2020   FREET4 2.26 (H) 06/24/2020   FREET4 2.00 (H) 11/24/2019   FREET4 2.26 (H) 10/28/2019   FREET4 1.60 (H) 05/01/2019   Lab Results  Component Value Date   T3FREE 4.5 (H) 04/26/2022   T3FREE 4.2 10/11/2021   T3FREE 4.5 (H) 09/23/2020   T3FREE 4.7 (H) 06/24/2020   T3FREE 4.3 (H) 11/24/2019   T3FREE 7.1 (H) 10/28/2019   T3FREE 3.2 05/01/2019  He is on amiodarone.  Graves' antibodies are not elevated: Lab Results  Component Value Date   TSI <89 09/23/2020   We started methimazole 5 mg daily in 2022. He is off this now... He is still on Amiodarone.  Hypercalcemia: He had occasional slightly high calcium levels, now normalized: Lab Results  Component Value Date   CALCIUM 9.6 10/11/2022  CALCIUM 9.6 06/19/2022   CALCIUM 9.0 06/19/2022   CALCIUM 9.1 02/16/2022   CALCIUM 9.6 10/11/2021   CALCIUM 10.2 02/21/2021   CALCIUM 10.2 11/19/2020   CALCIUM 9.8 09/06/2020   CALCIUM 10.2 08/18/2020   CALCIUM 10.6 (H) 06/24/2020   CALCIUM 9.0 05/27/2020   CALCIUM 10.2 05/20/2020   CALCIUM 9.9 04/06/2020   CALCIUM 10.2 02/18/2020   CALCIUM 10.2 11/18/2019   CALCIUM 10.5 (H) 10/28/2019   CALCIUM 10.1 09/03/2019   CALCIUM 9.8 05/01/2019   CALCIUM 9.4 04/03/2019   CALCIUM 9.7 02/10/2019    Lab Results  Component Value Date   MG 1.9 05/01/2019   MG 1.7 12/27/2018   MG 1.7  12/16/2018   MG 1.4 (L) 12/15/2018   No results found for: "VD25OH"   No previous colonoscopies.  He has a history of nasal polyp surgeries and mentions that he has a lot of scar tissue in his nose.  He lost a significant amount of weight after he moved from [place] to [place]. He attributed this to his diabetes. This was not unintentional.  No FH of DM, pituitary tumors.   He is a Education officer, environmental.  Uncontrolled DM2: - diagnosed in 2013 - managed by his PCP  Reviewed his HbA1c levels: Lab Results  Component Value Date   HGBA1C 13.8 (A) 10/11/2022   HGBA1C 15.0 (H) 06/19/2022   HGBA1C >15 02/08/2022   HGBA1C >15 12/07/2021   HGBA1C 7.1 (A) 02/21/2021   HGBA1C 7.1 (A) 11/19/2020   HGBA1C 8.1 (A) 08/18/2020   HGBA1C 9.0 (H) 05/20/2020   HGBA1C 10.4 (A) 03/01/2020   HGBA1C 11.8 (A) 12/05/2019   HGBA1C 8.7 (A) 09/10/2019   HGBA1C 10.0 (A) 03/17/2019   HGBA1C 12.7 (H) 12/15/2018   He continues on: - Lantus  22 units at night -- started 2 months ago - Glipizide 10 mg 2x a day He was briefly on Lantus but ran out before our last OV and refused to retry it. PCP again rec'd Lantus this year.  Prev. On: - Metformin 1000 mg 2x a day - Farxiga 10 mg before breakfast She refused injectable medications in the past.  At that time, he declined is working together in diabetes management, as he wanted to return to see his PCP for this problem. He came off Metformin b/c poss. SEs... He came off [place]...  He checks CBG daily: - am: 182-280s - 2h after b'fast: n/c - lunch: n/c - 2h after lunch: 290-310 - dinner: n/c - 2h after dinner: n/c - bedtime: n/c  Lab Results  Component Value Date   BUN 12 10/11/2022   Lab Results  Component Value Date   CREATININE 1.02 10/11/2022   Lab Results  Component Value Date   CHOL 190 10/11/2022   HDL 52 10/11/2022   LDLCALC 114 (H) 10/11/2022   TRIG 136 10/11/2022   CHOLHDL 3.7 10/11/2022  No statin.  No FH of DM2.  ROS: + see  HPI  I reviewed pt's medications, allergies, PMH, social hx, family hx, and changes were documented in the history of present illness. Otherwise, unchanged from my initial visit note.  Past Medical History:  Diagnosis Date   Acromegaly (HCC)    CHF (congestive heart failure) (HCC)    Essential hypertension    Hypertensive emergency 12/15/2018   Nonischemic cardiomyopathy (HCC)    Obesity    Pituitary macroadenoma (HCC)    Systolic heart failure (HCC)    Type 2 diabetes mellitus with complication (HCC)  Past Surgical History:  Procedure Laterality Date   CRANIOTOMY N/A 05/26/2020   Procedure: ENDOSCOPIC TRANSPHENOIDAL RESECTION OF TUMOR;  Surgeon: Bedelia Person, MD;  Location: [facility] OR;  Service: Neurosurgery;  Laterality: N/A;   RIGHT/LEFT HEART CATH AND CORONARY ANGIOGRAPHY N/A 12/16/2018   Procedure: RIGHT/LEFT HEART CATH AND CORONARY ANGIOGRAPHY;  Surgeon: Runell Gess, MD;  Location: [facility] LAB;  Service: Cardiovascular;  Laterality: N/A;   TRANSPHENOIDAL APPROACH EXPOSURE N/A 05/26/2020   Procedure: TRANSPHENOIDAL APPROACH EXPOSURE;  Surgeon: Osborn Coho, MD;  Location: [facility] OR;  Service: ENT;  Laterality: N/A;   Social History   Socioeconomic History   Marital status: Widowed    Spouse name: [name]   Number of children: 1   Years of education: Not on file   Highest education level: Not on file  Occupational History   Occupation: Minister/carpenter/musician  Tobacco Use   Smoking status: Former   Smokeless tobacco: Never  Building services engineer Use: Never used  Substance and Sexual Activity   Alcohol use: Never   Drug use: Never   Sexual activity: Yes  Other Topics Concern   Not on file  Social History Narrative   Not on file   Social Determinants of Health   Financial Resource Strain: Not on file  Food Insecurity: No Food Insecurity (02/16/2022)   Hunger Vital Sign    Worried About Running Out of Food in the Last Year: Never true    Ran Out of  Food in the Last Year: Never true  Transportation Needs: No Transportation Needs (02/16/2022)   PRAPARE - Administrator, Civil Service (Medical): No    Lack of Transportation (Non-Medical): No  Physical Activity: Not on file  Stress: Not on file  Social Connections: Not on file  Intimate Partner Violence: Not on file   Current Outpatient Medications on File Prior to Visit  Medication Sig Dispense Refill   amiodarone (PACERONE) 200 MG tablet Take 1 tablet by mouth once daily 90 tablet 0   glipiZIDE (GLUCOTROL) 10 MG tablet Take 1 tablet (10 mg total) by mouth 2 (two) times daily before a meal. 90 tablet 1   insulin glargine (LANTUS SOLOSTAR) 100 UNIT/ML Solostar Pen Inject 22 Units into the skin daily. 15 mL PRN   Insulin Pen Needle (PEN NEEDLES) 31G X 5 MM MISC 1 each by Does not apply route daily. 100 each 11   sacubitril-valsartan (ENTRESTO) 24-26 MG Take 1 tablet by mouth 2 (two) times daily. 180 tablet 3   spironolactone (ALDACTONE) 25 MG tablet Take 1 tablet (25 mg total) by mouth daily. 90 tablet 1   No current facility-administered medications on file prior to visit.   Allergies  Allergen Reactions   Coreg [Carvedilol] Other (See Comments)    Brings heart rate and blood pressure down severly   [place] [Dapagliflozin] Nausea Only   Family History  Problem Relation Age of Onset   Hypertension Mother    Heart attack Mother    Hypertension Father    Hypertension Sister    Hypertension Brother    PE: BP 130/80   Pulse 85   Ht 6\' 4"  (1.93 m)   Wt 257 lb 6.4 oz (116.8 kg)   SpO2 97%   BMI 31.33 kg/m  Wt Readings from Last 3 Encounters:  12/07/22 257 lb 6.4 oz (116.8 kg)  11/07/22 257 lb (116.6 kg)  10/11/22 253 lb 12.8 oz (115.1 kg)   Constitutional: overweight, in NAD, +  deep voice, + frontal bossing, + enlarged jaw with widely spaced incisors, + enlarged lower lip, + prominent nasolabial folds Eyes: EOMI, no exophthalmos ENT:  no thyromegaly, no cervical  lymphadenopathy Cardiovascular: RRR, No MRG Respiratory: CTA B Musculoskeletal: + deformities (large jaw, fingers, forehead) Skin: no rashes, + skin tags on neck Neurological: no tremor with outstretched hands  ASSESSMENT: 1.  Acromegaly  2.  Pituitary macroadenoma  3.  Thyrotoxicosis  4.  Hypercalcemia  5.  Uncontrolled type 2 diabetes  PLAN:  1.and 2.   -Patient with 20-year history of facial features changes and dramatic change in appearance over the years, per review of pictures brought by patient.  He was found to have a high growth hormone by his cardiologist, Dr. [name], during investigation for cardiomegaly.  At our first visit, we checked an IGF-I along with growth hormone and these were very high.  He also had high LH, FSH, and ACTH.  Pituitary MRI (01/09/2020) showed a macroadenoma measuring 2.5 cm, with suprasellar extension and abutting the inferior optic chiasm.  Also, the tumor eroded into the sella floor it was extended in the right sphenoid sinus.  He was referred to neurosurgery and had transsphenoidal surgery 05/27/2020.  He tolerated the surgery well and did not develop DI or SIADH after surgery.  Also, he did not have increased congestion or headaches.  We reviewed together the pathology: Tumor was 1.8 cm in the largest dimension and it was an adenoma, however, no other pathology information was offered.  I discussed with pathology and requested staining for gross hormone granularity, prolactin, somatostatin receptors, and Ki-67, to help with further treatment planning.  Only the Ki-67 was obtained and this was low, less than 1%.  At last visit we discussed the this is indicative of a slow-growing tumor, a grade 1 met, which is good news. -His IGF-I, however, did not decrease after surgery, and remains quite high.  A growth hormone was not checked after surgery, but ideally this would have been lower than 2 ng/mL fasting.  We checked the level at last visit and this was  12, increased from 7 ng/mL at the previous visit.  IGF-I was still very high, in the 900s, slightly higher than before.  Ideally, growth hormone is checked with glucose load, however, an OGTT was not needed for him since his glucose levels were so high.  We discussed that we may need an OGTT if glucose is normal and GH is not suppressed - ideally GH post 75 mg glucose solution is  <1, or, better, <0.4 for admission.   -He has persistent tumor evident on his most recent pituitary MRI from 08/03/2020 showing left sellar mass measuring 1.1 cm in the largest dimension and also persistent masslike structure in the sphenoid sinus.  At last visit I ordered another MRI but he did not have it yet.  Upon questioning, he mentions that this was he is on a very limited income.  However, he is not sure whether this would not be covered by his insurance.  We discussed that this is quite important -will reorder today and I advised him to discuss with the scheduling center and find out about price.  I am hoping he does not have a large co-pay. -We reviewed adjuvant therapy: Somatostatin analogs -I would suggest either somatostatin LAR (im) on, preferably, lanreotide (sq), which are injected monthly.  He now lives in [place] about 3 hours away, so it would be challenging to come to the clinic to  get the injections.  At last visit, I suggested [facility], but he is even further from them, approximately 5 hours away.  At today's visit, we again discussed about the injections and he refuses them.  I advised him that without these, the tumor will most likely progress.  I advised him to at least look into this to injections and let me know what he decides.  I strongly advised him to reconsider the decision.  He agrees to think about this. Cabergoline-usually as an additive to the somatostatin analogs.  The rationale is that some acromegalic tumors maysecrete prolactin, so may be responsive to dopamine agonists.  He agrees to  try this after the results come back today.  We discussed about the benefits and possible side effects. Somatostatin receptor blocker -I believe that he may need to add decrease amount.  The treatment is expensive, and it is usually approved as an addition to somatostatin analogs and not isolated therapy due to the fact that the tumor can increase in size during the treatment repeat surgery -I wonder if this is still an option, with the tumor extending beyond the sella turcica; however, upon discussion with the patient today, he adamantly refused reintervention Gamma knife surgery -we may need to proceed with this as a last resort due to cost and possible complications including hypopituitarism.  We did not discuss about this today but will do so depending on his decision regarding the above. -His ACTH and cortisol levels were normal at last check and we will repeat them today; will also recheck prolactin level along with growth hormone and IGF-I -I will see him back in 3 to 4 months  3.  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 -We discussed in the past about trying to start methimazole only, not prednisone, due to his uncontrolled diabetes -At last visit, TSH was normal.  Since then, he had another TSH that was slightly suppressed, with slightly high thyroid hormones -We will repeat his TFTs today  4.  Hypercalcemia -He had an occasional slightly high calcium in the past and also around the time of his surgery - this is occasionally seen in the setting of acromegaly either due to high PTH or high calcitriol levels. -Most recent calcium levels have been normal -No further investigation needed for now  5.  Uncontrolled diabetes -He had significant  worsening of his diabetes control -He initially was on metformin but he stopped it after one of his neighbors went on dialysis after using metformin.  I did advise him that this likely happened due to diabetes itself, not metformin but he was adamant that he would not want to restart this.  He also came off [place] and did not want to restart it.  At last visit he declined my involvement in his diabetes care and also declined insulin injections. -Since last visit, though, he had a visit to the emergency room for very high blood sugars in the 600s and afterwards he started on insulin.  He mentions that he started it 2 months ago.  He uses 22 units of Lantus.  This is expensive for him.  I advised him to gust with the pharmacist about this since Lantus price is capped.  Will go ahead and increase the dose today, since  sugars are still very high. -Since the sugars are better in the morning and increase throughout the day, we discussed that further escalation of the regimen is needed.  We discussed about GLP-1 receptor agonists and gave him a sample of Ozempic.  I advised him how and when to inject it.  Will start at a low dose and increase as tolerated.  Discussed about possible side effects but also benefits.  I sent a prescription for this to his pharmacy and advised him that if this is expensive, to fill out patient assistance application papers for Novo and send this back to [place]. -For now, we will continue glipizide  Orders Placed This Encounter  Procedures   MR Brain W Wo Contrast   Insulin-like growth factor   Prolactin   TSH   T4, free   T3, free   Cortisol   ACTH   Growth hormone   - Total time spent for the visit: 40 min, in precharting, postcharting, reviewing obtaining medical information from the chart and from the pt, reviewing his  previous labs, evaluations, and treatments, reviewing his symptoms, counseling him about his endocrine problems (please see the discussed topics above), and  developing a plan to further treat it; he had a number of questions which I addressed.  Carlus Pavlov, MD PhD [facility]

## 2022-12-08 LAB — PROLACTIN: Prolactin: 8.3 ng/mL (ref 2.0–18.0)

## 2022-12-08 NOTE — Telephone Encounter (Signed)
Pharmacy Patient Advocate Encounter  Prior Authorization for Ozempic has been APPROVED from 12/07/22 to 12/07/23.   PA # [id number]

## 2022-12-10 LAB — EXTRA SPECIMEN

## 2022-12-10 LAB — ACTH: C206 ACTH: 35 pg/mL (ref 6–50)

## 2022-12-12 ENCOUNTER — Other Ambulatory Visit: Payer: Self-pay | Admitting: Internal Medicine

## 2022-12-12 LAB — INSULIN-LIKE GROWTH FACTOR
IGF-I, LC/MS: 763 ng/mL — ABNORMAL HIGH (ref 50–317)
Z-Score (Male): 4.5 SD — ABNORMAL HIGH (ref ?–2.0)

## 2022-12-12 LAB — GROWTH HORMONE: Growth Hormone: 6.3 ng/mL (ref <=7.1)

## 2022-12-13 MED ORDER — CABERGOLINE 0.5 MG PO TABS: 0.2500 mg | ORAL_TABLET | ORAL | 1 refills | Status: DC

## 2022-12-13 NOTE — Addendum Note (Signed)
Addended by: Carlus Pavlov on: 12/13/2022 12:29 PM   Modules accepted: Orders

## 2023-01-17 ENCOUNTER — Other Ambulatory Visit: Payer: BLUE CROSS/BLUE SHIELD

## 2023-02-19 NOTE — Progress Notes (Signed)
Advanced [facility] Failure Clinic Note   PCP: Kevin Rigg, NP HF Cardiologist: Dr. Gala Hampton  HPI: 57 y.o. male with  HTN, poorly-controlled DM2, acromegaly and systolic HF due to NICM.   Admitted 7/20 with HTN urgency and acute systolic HF. ECG with LBBB of uncertain duration. Echo EF 25-30%.Cardiac cath showed normal coronaries with well compensated filling pressures after diuresis.   He was started on GDMT including carvedilol 6.25 bid, Entresto 49/51 bid, spironolactone 12.5 daily. He did well for several days but then developed severe fatigue. I saw him in Clinic on 12/27/18. He was in bigeminy. I stopped his b-blocker and started amio 200 bid.K 5.0 Mg 1.7  Echo 9/21: EF 30-35%, RV ok.  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 Referred to Dr. Marylouise Hampton. Underwent transphenoidal pituitary resection in 12/21.  Echo 3/22: EF 20-25% + septal dyssynchrony RV ok.   Echo 5/23 EF 25-30% RV mildly HK  Follow up 15-Mar-2023. Wife passed away from HF on 05/30/2021. NYHA II-early III, volume ok. Planning for CRT when DM2 better controlled, referred back to Endo.  Referred back to DR. [name] for evaluation of hyperthyroidism, acromegaly and further treatment of uncontrolled DM2. Saw her once in 6/24.  He was prescribed cabergoline (did not start) and started on ozempic. Not taking methimazole. He stopped ozempic d/t side effects. Reports Dr. Lafe Hampton is not in network for him and he is unable to follow-up. Plans to have endocrine issues managed by PCP.  He is here today for 3 month follow-up. Echo today: EF ~ 25% on preliminary review with Dr. Gala Hampton. He is a Education officer, environmental, currently living in [place]. Plans to retire in [place] next year. Continues to have shortness of breath with exertion. Can climb up and down stairs 2-3 times before he must stop to catch his breath. No orthopnea, PND or lower extremity edema. Denies dizziness, presyncope or syncope. Taking [name] as prescribed. Continues to  struggle with depression after losing his wife.     Cardiac Studies - Echo (5/23): EF 25-30% RV mildly HK  - Echo (3/22): EF 20-25%, + septal dyssynchrony, RV ok  - Zio 5/23: 8 runs NSVT, 3.5% PVCs   - Echo 9/21: EF 30-35%, RV ok.  - cMRI (10/21/19): difficult study due to respiratory artifact and frequent ectopy, LVEF 20-25%, di hypokinesis with basal to mid inferolateral akinesis, normal RVEF and function, dense basal to mid inferolateral scar. This looked most consistent with a prior MI in the LCx territory.   - Echo (3/21): EF 25% RV ok.   - Zio (10/20): 4.8%, PVCs  - Echo (11/19): EF ~35-40% with severe dyssynchrony due to LBBB and PVCs   SH:  Social History   Socioeconomic History   Marital status: Widowed    Spouse name: Kevin Hampton   Number of children: 1   Years of education: Not on file   Highest education level: Not on file  Occupational History   Occupation: Minister/carpenter/musician  Tobacco Use   Smoking status: Former   Smokeless tobacco: Never  Vaping Use   Vaping status: Never Used  Substance and Sexual Activity   Alcohol use: Never   Drug use: Never   Sexual activity: Yes  Other Topics Concern   Not on file  Social History Narrative   Not on file   Social Determinants of Health   Financial Resource Strain: Low Risk  (02/20/2023)   Overall Financial Resource Strain (CARDIA)    Difficulty of  Paying Living Expenses: Not hard at all  Food Insecurity: No Food Insecurity (02/20/2023)   Hunger Vital Sign    Worried About Running Out of Food in the Last Year: Never true    Ran Out of Food in the Last Year: Never true  Transportation Needs: No Transportation Needs (02/20/2023)   PRAPARE - Administrator, Civil Service (Medical): No    Lack of Transportation (Non-Medical): No  Physical Activity: Not on file  Stress: Not on file  Social Connections: Not on file  Intimate Partner Violence: Not on file    FH:  Family History  Problem  Relation Age of Onset   Hypertension Mother    Heart attack Mother    Hypertension Father    Hypertension Sister    Hypertension Brother     Past Medical History:  Diagnosis Date   Acromegaly (HCC)    CHF (congestive heart failure) (HCC)    Essential hypertension    Hypertensive emergency 12/15/2018   Nonischemic cardiomyopathy (HCC)    Obesity    Pituitary macroadenoma (HCC)    Systolic heart failure (HCC)    Type 2 diabetes mellitus with complication (HCC)     Current Outpatient Medications  Medication Sig Dispense Refill   amiodarone (PACERONE) 200 MG tablet Take 1 tablet by mouth once daily 90 tablet 0   glipiZIDE (GLUCOTROL) 10 MG tablet Take 1 tablet (10 mg total) by mouth 2 (two) times daily before a meal. 90 tablet 1   Insulin Pen Needle (PEN NEEDLES) 31G X 5 MM MISC 1 each by Does not apply route daily. 100 each 11   sacubitril-valsartan (ENTRESTO) 24-26 MG Take 1 tablet by mouth 2 (two) times daily. 180 tablet 3   spironolactone (ALDACTONE) 25 MG tablet Take 1 tablet (25 mg total) by mouth daily. 90 tablet 1   Semaglutide,0.25 or 0.5MG /DOS, 2 MG/3ML SOPN Inject 0.5 mg into the skin once a week. (Patient not taking: Reported on 02/20/2023) 9 mL 3   No current facility-administered medications for this encounter.   BP 122/80   Pulse 67   Wt 117.6 kg (259 lb 3.2 oz)   SpO2 99%   BMI 31.55 kg/m   Wt Readings from Last 3 Encounters:  02/20/23 117.6 kg (259 lb 3.2 oz)  12/07/22 116.8 kg (257 lb 6.4 oz)  11/07/22 116.6 kg (257 lb)   PHYSICAL EXAM: General:  Well appearing. HEENT: Prominent forehead, enlarged jaw and lower lip. Neck: supple. no JVD. Carotids 2+ bilat; no bruits.  Cor: PMI nondisplaced. Regular rate & rhythm. No rubs, gallops or murmurs. Lungs: clear Abdomen: soft, nontender, nondistended.  Extremities: no cyanosis, clubbing, rash, edema, + acromegaly Neuro: alert & orientedx3. Affect pleasant   ASSESSMENT & PLAN:  1. Chronic systolic HF -  diagnosed 7/20 EF ~25-30%. NICM - cath 7/20 with normal coronaries and compensated hemodynamics - suspect he likely has LBBB +/- PVC-induced CM  - Zio patch 10/20 with 4.8% PVC  - Echo 09/03/19: EF ~25% with severe LV dyssynchrony (no improvement with PVC suppression) - cMRI 5/21 EF 20-25% suggestive of PVC/LBBB CM but presence of ? Inferolateral scar is puzzling.  - Echo 9/21: EF 30-35%, severe decrease LV function with dyssynchrony, RV normal - Echo 08/23/20: EF 20-25% + septal dyssynchrony RV ok. - Echo 5/23: EF 25-30% - Zio 5/23: 8 runs NSVT, 3.5% PVCs  - PVCs mostly suppressed EF still down. Suspect LBBB CM - Echo today: EF 25% on preliminary review - Seen  by EP for consideration of CRT, felt due to lack of significant symptoms and no activity limitations, low risk for cardiac arrest and would defer device at this time pending ongoing EF improvement. - NYHA III. Not volume overloaded. Does not need daily loop diuretic. - Ideally would like to be more aggressive with GDMT but has trouble tolerating medications.  - Continue Entresto 24/26 mg bid (did not feel well on higher dose). Declines to titrate. - Continue spiro 25 mg daily. - Off Coreg with severe fatigue. (Will not re-challenge with Toprol given on-going fatigue, though suspect symptoms in part related to uncontrolled DM2.) - Off Farxiga due to intolerance (nausea) will not reattempt currently until A1c bettrer controlled - Given persistent LV dysfunction and NYHA III symptoms I think he will benefit from CRT but will wait until DM2 better controlled first.  - Return in 6 months, he will call before then with any worsening HF symptoms.   2. Frequent PVCs - Amio decreased to 100 daily.  - zio 10/20 4.8%  - Zio 5/23 3.5% PVCs - Can consider switch to mexilitene as needed.  - LFTs ok 5/23, TSH 0.5 and Free T4 1.64 in 06/24   3. HTN - BP stable.  - Meds as above  4. LBBB - Discussion as above. Has seen EP and CRT consideration  deferred.  - Given persistent LV dysfunction and NYHA II-III symptoms, I think he will benefit from CRT but will wait until DM2 better controlled first (would likel to see A1c < 10)  5. Poorly controlled DM2 - Followed by Endo, Dr. Lafe Hampton not in network for him. Plans to follow-up with PCP - Last HGBA1c 15 -> 13.8 - Only on glipizide.  - Unable to tolerate ozempic. States insulin did not lower blood glucose much.  6. Hyperthyroidism - suspect related to amio - had been on methimazole at one point, now off - Last TSH 0.5  6. Acromegaly - has growth hormone excess - S/p  transphenoid pituitary resection surgery & sinus septoplasty 12/21  7. Fatigue/erythyrocytosis - mild OSA on sleep study 06/24  Discussed updating labs including CMET, thyroid function studies and Hgb A1c. He is planning to schedule follow-up with PCP and would prefer to hold off on any additional blood draws until then.  Follow-up: 6 months  FINCH, LINDSAY N, PA-C  10:38 AM  Patient seen and examined with the above-signed Advanced Practice Provider and/or Housestaff. I personally reviewed laboratory data, imaging studies and relevant notes. I independently examined the patient and formulated the important aspects of the plan. I have edited the note to reflect any of my changes or salient points. I have personally discussed the plan with the patient and/or family.  Says he feels pretty good. Has not followed with Endo and DM2 and hyperthyroidism remain undertreated. Denies edema, orthopnea, orthopnea or PND. Not interested in titrating HF meds.   General:  Well appearing. No resp difficulty HEENT: normal Neck: supple. no JVD. Carotids 2+ bilat; no bruits. No lymphadenopathy or thryomegaly appreciated. Cor: PMI nondisplaced. Regular rate & rhythm. No rubs, gallops or murmurs. Lungs: clear Abdomen: soft, nontender, nondistended. No hepatosplenomegaly. No bruits or masses. Good bowel sounds. Extremities: no cyanosis,  clubbing, rash, edema Neuro: alert & orientedx3, cranial nerves grossly intact. moves all 4 extremities w/o difficulty. Affect pleasant  NYHA II-III. EF ~25% on echo today.(Reviewed personally). Volume status ok. Remains unwilling to increase treatment for DM2, HF or thyroid disease. Will check labs today.   Arvilla Meres,  MD  9:01 PM

## 2023-02-20 ENCOUNTER — Ambulatory Visit (HOSPITAL_COMMUNITY)
Admission: RE | Admit: 2023-02-20 | Discharge: 2023-02-20 | Disposition: A | Discharge status: Home / Self Care | Payer: BLUE CROSS/BLUE SHIELD | Source: Ambulatory Visit | Attending: Internal Medicine | Admitting: Internal Medicine

## 2023-02-20 ENCOUNTER — Encounter (HOSPITAL_COMMUNITY): Payer: Self-pay | Admitting: Internal Medicine

## 2023-02-20 ENCOUNTER — Ambulatory Visit (HOSPITAL_BASED_OUTPATIENT_CLINIC_OR_DEPARTMENT_OTHER)
Admission: RE | Admit: 2023-02-20 | Discharge: 2023-02-20 | Disposition: A | Discharge status: Home / Self Care | Payer: BLUE CROSS/BLUE SHIELD | Source: Ambulatory Visit | Attending: Internal Medicine | Admitting: Internal Medicine

## 2023-02-20 VITALS — BP 122/80 | HR 67 | Wt 259.2 lb

## 2023-02-20 DIAGNOSIS — E1165 Type 2 diabetes mellitus with hyperglycemia: Secondary | ICD-10-CM | POA: Diagnosis not present

## 2023-02-20 DIAGNOSIS — Z794 Long term (current) use of insulin: Secondary | ICD-10-CM | POA: Insufficient documentation

## 2023-02-20 DIAGNOSIS — Z7984 Long term (current) use of oral hypoglycemic drugs: Secondary | ICD-10-CM | POA: Diagnosis not present

## 2023-02-20 DIAGNOSIS — E0590 Thyrotoxicosis, unspecified without thyrotoxic crisis or storm: Secondary | ICD-10-CM | POA: Insufficient documentation

## 2023-02-20 DIAGNOSIS — L905 Scar conditions and fibrosis of skin: Secondary | ICD-10-CM | POA: Insufficient documentation

## 2023-02-20 DIAGNOSIS — I110 Hypertensive heart disease with heart failure: Secondary | ICD-10-CM | POA: Insufficient documentation

## 2023-02-20 DIAGNOSIS — I5022 Chronic systolic (congestive) heart failure: Secondary | ICD-10-CM | POA: Diagnosis not present

## 2023-02-20 DIAGNOSIS — R0602 Shortness of breath: Secondary | ICD-10-CM | POA: Diagnosis present

## 2023-02-20 DIAGNOSIS — Z79899 Other long term (current) drug therapy: Secondary | ICD-10-CM | POA: Diagnosis not present

## 2023-02-20 DIAGNOSIS — I493 Ventricular premature depolarization: Secondary | ICD-10-CM | POA: Insufficient documentation

## 2023-02-20 DIAGNOSIS — G4733 Obstructive sleep apnea (adult) (pediatric): Secondary | ICD-10-CM | POA: Insufficient documentation

## 2023-02-20 DIAGNOSIS — I447 Left bundle-branch block, unspecified: Secondary | ICD-10-CM | POA: Diagnosis not present

## 2023-02-20 DIAGNOSIS — I428 Other cardiomyopathies: Secondary | ICD-10-CM | POA: Insufficient documentation

## 2023-02-20 DIAGNOSIS — F32A Depression, unspecified: Secondary | ICD-10-CM | POA: Insufficient documentation

## 2023-02-20 DIAGNOSIS — E220 Acromegaly and pituitary gigantism: Secondary | ICD-10-CM | POA: Insufficient documentation

## 2023-02-20 LAB — ECHOCARDIOGRAM COMPLETE
Area-P 1/2: 3.01 cm2
S' Lateral: 5.2 cm

## 2023-02-20 NOTE — Progress Notes (Signed)
H&V Care Navigation CSW Progress Note  Clinical Social Worker met with patient to complete SDoH screening.  Patient is participating in a Managed Medicaid Plan:  No  CSW met with patient to complete SDoH screening and no needs identified. Patient will reach out if needs arise. Lasandra Beech, LCSW, CCSW-MCS [phone]   SDOH Screenings   Food Insecurity: No Food Insecurity (02/20/2023)  Housing: Low Risk  (02/20/2023)  Transportation Needs: No Transportation Needs (02/20/2023)  Utilities: Not At Risk (02/20/2023)  Depression (PHQ2-9): Low Risk  (10/11/2022)  Financial Resource Strain: Low Risk  (02/20/2023)  Tobacco Use: Medium Risk (02/20/2023)  Health Literacy: Adequate Health Literacy (02/20/2023)

## 2023-02-20 NOTE — Patient Instructions (Signed)
Good to see you today!  No medication changes were made  Your physician recommends that you schedule a follow-up appointment in: 6 months(March 2025) Call office in January to schedule an appointment  If you have any questions or concerns before your next appointment please send [place] a message through [place] or call our office at [phone].    TO LEAVE A MESSAGE FOR THE NURSE SELECT OPTION 2, PLEASE LEAVE A MESSAGE INCLUDING: YOUR NAME DATE OF BIRTH CALL BACK NUMBER REASON FOR CALL**this is important as we prioritize the call backs  YOU WILL RECEIVE A CALL BACK THE SAME DAY AS LONG AS YOU CALL BEFORE 4:00 PM  At the [facility], you and your health needs are our priority. As part of our continuing mission to provide you with exceptional heart care, we have created designated Provider Care Teams. These Care Teams include your primary Cardiologist (physician) and Advanced Practice Providers (APPs- Physician Assistants and Nurse Practitioners) who all work together to provide you with the care you need, when you need it.   You may see any of the following providers on your designated Care Team at your next follow up: Dr Arvilla Meres Dr Marca Ancona Dr. Marcos Eke, NP Robbie Lis, [place] [facility] [place], [place] Brynda Peon, NP Karle Plumber, PharmD   Please be sure to bring in all your medications bottles to every appointment.    Thank you for choosing [facility]

## 2023-03-14 ENCOUNTER — Ambulatory Visit: Payer: BLUE CROSS/BLUE SHIELD | Admitting: Internal Medicine

## 2023-03-22 ENCOUNTER — Other Ambulatory Visit (HOSPITAL_COMMUNITY): Payer: Self-pay

## 2023-03-22 MED ORDER — AMIODARONE HCL 200 MG PO TABS: 100.0000 mg | ORAL_TABLET | Freq: Every day | ORAL | 3 refills | Status: DC

## 2023-03-22 NOTE — Telephone Encounter (Signed)
Meds ordered this encounter  Medications   amiodarone (PACERONE) 200 MG tablet    Sig: Take 0.5 tablets (100 mg total) by mouth daily.    Dispense:  45 tablet    Refill:  3

## 2023-04-02 ENCOUNTER — Other Ambulatory Visit: Payer: Self-pay | Admitting: Nurse Practitioner

## 2023-04-02 DIAGNOSIS — I10 Essential (primary) hypertension: Secondary | ICD-10-CM

## 2023-04-04 NOTE — Telephone Encounter (Signed)
Requested Prescriptions  Pending Prescriptions Disp Refills   spironolactone (ALDACTONE) 25 MG tablet [Pharmacy Med Name: Spironolactone 25 MG Oral Tablet] 90 tablet 0    Sig: Take 1 tablet by mouth once daily     Cardiovascular: Diuretics - Aldosterone Antagonist Passed - 04/02/2023  7:00 PM      Passed - Cr in normal range and within 180 days    Creatinine, Ser  Date Value Ref Range Status  10/11/2022 1.02 0.76 - 1.27 mg/dL Final         Passed - K in normal range and within 180 days    Potassium  Date Value Ref Range Status  10/11/2022 4.1 3.5 - 5.2 mmol/L Final         Passed - Na in normal range and within 180 days    Sodium  Date Value Ref Range Status  10/11/2022 136 134 - 144 mmol/L Final         Passed - eGFR is 30 or above and within 180 days    GFR calc Af Amer  Date Value Ref Range Status  04/06/2020 85 >59 mL/min/1.73 Final    Comment:    **In accordance with recommendations from the NKF-ASN Task force,**   [facility] is in the process of updating its eGFR calculation to the   2021 CKD-EPI creatinine equation that estimates kidney function   without a race variable.    GFR, Estimated  Date Value Ref Range Status  06/19/2022 60 (L) >60 mL/min Final    Comment:    (NOTE) Calculated using the CKD-EPI Creatinine Equation (2021)    GFR  Date Value Ref Range Status  06/24/2020 69.86 >60.00 mL/min Final    Comment:    Calculated using the CKD-EPI Creatinine Equation (2021)   eGFR  Date Value Ref Range Status  10/11/2022 85 >59 mL/min/1.73 Final         Passed - Last BP in normal range    BP Readings from Last 1 Encounters:  02/20/23 122/80         Passed - Valid encounter within last 6 months    Recent Outpatient Visits           5 months ago Type 2 diabetes mellitus with hyperglycemia, without long-term current use of insulin [facility])   [facility] [place], [place] W, NP   1 year ago Type 2 diabetes mellitus with  hyperglycemia, without long-term current use of insulin [facility])   [place] [facility] [place], [place], [place]   1 year ago Type 2 diabetes mellitus with hyperglycemia, without long-term current use of insulin [facility])   [facility] [place], [place], [place]   2 years ago Type 2 diabetes mellitus with hyperglycemia, without long-term current use of insulin [facility])   [place] [facility] [place], [place] W, NP   2 years ago Type 2 diabetes mellitus with hyperglycemia, without long-term current use of insulin [facility])   [place] [facility] [place], Shea Stakes, NP

## 2023-05-01 ENCOUNTER — Other Ambulatory Visit: Payer: Self-pay | Admitting: Physician Assistant

## 2023-05-01 DIAGNOSIS — E1165 Type 2 diabetes mellitus with hyperglycemia: Secondary | ICD-10-CM

## 2023-05-29 ENCOUNTER — Other Ambulatory Visit: Payer: Self-pay | Admitting: Family Medicine

## 2023-05-29 DIAGNOSIS — E1165 Type 2 diabetes mellitus with hyperglycemia: Secondary | ICD-10-CM

## 2023-06-26 ENCOUNTER — Other Ambulatory Visit: Payer: Self-pay | Admitting: Nurse Practitioner

## 2023-06-26 DIAGNOSIS — I10 Essential (primary) hypertension: Secondary | ICD-10-CM

## 2023-07-02 ENCOUNTER — Ambulatory Visit: Payer: BLUE CROSS/BLUE SHIELD | Admitting: Nurse Practitioner

## 2023-07-02 ENCOUNTER — Other Ambulatory Visit: Payer: Self-pay | Admitting: Family Medicine

## 2023-07-02 DIAGNOSIS — E1165 Type 2 diabetes mellitus with hyperglycemia: Secondary | ICD-10-CM

## 2023-07-04 ENCOUNTER — Other Ambulatory Visit: Payer: Self-pay | Admitting: Pharmacist

## 2023-07-04 ENCOUNTER — Telehealth: Payer: Self-pay

## 2023-07-04 DIAGNOSIS — E1165 Type 2 diabetes mellitus with hyperglycemia: Secondary | ICD-10-CM

## 2023-07-04 MED ORDER — GLIPIZIDE 10 MG PO TABS: 10.0000 mg | ORAL_TABLET | Freq: Two times a day (BID) | ORAL | 1 refills | Status: DC

## 2023-07-04 NOTE — Telephone Encounter (Signed)
Copied from CRM [id number]. Topic: Clinical - Prescription Issue >> Jul 04, 2023 11:13 AM Franchot Heidelberg wrote: Reason for CRM: Pt called regarding his Glipizide refill request, it was denied yet he does not have any. Please advise, pt is out of his supply.

## 2023-07-04 NOTE — Telephone Encounter (Signed)
Thank you :)

## 2023-07-30 ENCOUNTER — Other Ambulatory Visit: Payer: Self-pay | Admitting: Family Medicine

## 2023-07-30 DIAGNOSIS — I10 Essential (primary) hypertension: Secondary | ICD-10-CM

## 2023-08-02 ENCOUNTER — Other Ambulatory Visit (HOSPITAL_COMMUNITY): Payer: Self-pay

## 2023-08-24 ENCOUNTER — Encounter: Payer: Self-pay | Admitting: Nurse Practitioner

## 2023-08-24 ENCOUNTER — Ambulatory Visit: Payer: BLUE CROSS/BLUE SHIELD | Attending: Nurse Practitioner | Admitting: Nurse Practitioner

## 2023-08-24 VITALS — BP 117/82 | HR 75 | Resp 19 | Ht 76.0 in | Wt 256.8 lb

## 2023-08-24 DIAGNOSIS — I5022 Chronic systolic (congestive) heart failure: Secondary | ICD-10-CM

## 2023-08-24 DIAGNOSIS — E119 Type 2 diabetes mellitus without complications: Secondary | ICD-10-CM

## 2023-08-24 DIAGNOSIS — I10 Essential (primary) hypertension: Secondary | ICD-10-CM

## 2023-08-24 DIAGNOSIS — R7989 Other specified abnormal findings of blood chemistry: Secondary | ICD-10-CM

## 2023-08-24 DIAGNOSIS — Z794 Long term (current) use of insulin: Secondary | ICD-10-CM

## 2023-08-24 MED ORDER — SPIRONOLACTONE 25 MG PO TABS: 25.0000 mg | ORAL_TABLET | Freq: Every day | ORAL | 1 refills | Status: DC

## 2023-08-24 MED ORDER — LANTUS SOLOSTAR 100 UNIT/ML ~~LOC~~ SOPN: 30.0000 [IU] | PEN_INJECTOR | Freq: Every day | SUBCUTANEOUS | 99 refills | Status: DC

## 2023-08-24 MED ORDER — ENTRESTO 24-26 MG PO TABS: 1.0000 | ORAL_TABLET | Freq: Two times a day (BID) | ORAL | 1 refills | Status: DC

## 2023-08-24 MED ORDER — AMIODARONE HCL 200 MG PO TABS: 100.0000 mg | ORAL_TABLET | Freq: Every day | ORAL | 1 refills | Status: AC

## 2023-08-24 MED ORDER — PEN NEEDLES 31G X 5 MM MISC: 1.0000 | Freq: Every day | 11 refills | Status: AC

## 2023-08-24 MED ORDER — GLIPIZIDE 10 MG PO TABS: 10.0000 mg | ORAL_TABLET | Freq: Two times a day (BID) | ORAL | 1 refills | Status: DC

## 2023-08-24 NOTE — Progress Notes (Signed)
 Assessment & Plan:  Kevin "Minerva Areola" was seen today for medication management.  Diagnoses and all orders for this visit:  Diabetes mellitus with insulin therapy (HCC) -     Urine Albumin/Creatinine with ratio (send out) [LAB689] -     insulin glargine (LANTUS SOLOSTAR) 100 UNIT/ML Solostar Pen; Inject 30 Units into the skin daily. -     glipiZIDE (GLUCOTROL) 10 MG tablet; Take 1 tablet (10 mg total) by mouth 2 (two) times daily before a meal. -     Insulin Pen Needle (PEN NEEDLES) 31G X 5 MM MISC; 1 each by Does not apply route daily. -     CMP14+EGFR -     Hemoglobin A1c Continue blood sugar control as discussed in office today, low carbohydrate diet, and regular physical exercise as tolerated, 150 minutes per week (30 min each day, 5 days per week, or 50 min 3 days per week). Keep blood sugar logs with fasting goal of 90-130 mg/dl, post prandial (after you eat) less than 180.  For Hypoglycemia: BS <60 and Hyperglycemia BS >400; contact the clinic ASAP. Annual eye exams and foot exams are recommended.   Primary hypertension He was instructed to contact cardiology for entresto patient assistance if unable to afford current medication -     sacubitril-valsartan (ENTRESTO) 24-26 MG; Take 1 tablet by mouth 2 (two) times daily. -     spironolactone (ALDACTONE) 25 MG tablet; Take 1 tablet (25 mg total) by mouth daily. -     CMP14+EGFR Continue all antihypertensives as prescribed.  Reminded to bring in blood pressure log for follow  up appointment.  RECOMMENDATIONS: DASH/Mediterranean Diets are healthier choices for HTN.    Abnormal CBC If anemia persistent will need colonoscopy/cologuard.  -     CBC with Differential  Chronic systolic heart failure (HCC) -     sacubitril-valsartan (ENTRESTO) 24-26 MG; Take 1 tablet by mouth 2 (two) times daily. -     spironolactone (ALDACTONE) 25 MG tablet; Take 1 tablet (25 mg total) by mouth daily. -     amiodarone (PACERONE) 200 MG tablet; Take 0.5  tablets (100 mg total) by mouth daily.    Patient has been counseled on age-appropriate routine health concerns for screening and prevention. These are reviewed and up-to-date. Referrals have been placed accordingly. Immunizations are up-to-date or declined.    Subjective:   Chief Complaint  Patient presents with   Medication Management    Kevin Hampton 58 y.o. male presents to office today for follow up to HTN and DM   I have not seen him in over 6 months.   DECLINES COLONOSCOPY   He has a past medical history of Acromegaly (Followed by Endo but does not want to return to current endo nor does he want me to place referral for second opnion), CHF, Essential hypertension, Hypertensive emergency (12/15/2018), Nonischemic cardiomyopathy, NSTEMI,  LBBB, Obesity, Pituitary macroadenoma (S/P transphenoidal surgery), Systolic heart failure, and Type 2 diabetes mellitus with complication.   DM He has been out of insulin since yesterday. Currently administering lantus 22-23 units at bedtime. Does not recount any specific glucose readings today for me. He is no longer taking semaglutide. Only taking lantus and glipizide at this time.  Lab Results  Component Value Date   HGBA1C 13.8 (A) 10/11/2022      HTN Blood pressure is well controlled. He is overdue to see Cardiology. States he plans to see them over the next few months BP Readings from Last 3  Encounters:  08/24/23 117/82  02/20/23 122/80  12/07/22 130/80      Review of Systems  Constitutional:  Negative for fever, malaise/fatigue and weight loss.  HENT: Negative.  Negative for nosebleeds.   Eyes: Negative.  Negative for blurred vision, double vision and photophobia.  Respiratory: Negative.  Negative for cough and shortness of breath.   Cardiovascular: Negative.  Negative for chest pain, palpitations and leg swelling.  Gastrointestinal: Negative.  Negative for heartburn, nausea and vomiting.  Musculoskeletal: Negative.   Negative for myalgias.  Neurological: Negative.  Negative for dizziness, focal weakness, seizures and headaches.  Psychiatric/Behavioral: Negative.  Negative for suicidal ideas.     Past Medical History:  Diagnosis Date   Acromegaly (HCC)    CHF (congestive heart failure) (HCC)    Essential hypertension    Hypertensive emergency 12/15/2018   Nonischemic cardiomyopathy (HCC)    Obesity    Pituitary macroadenoma (HCC)    Systolic heart failure (HCC)    Type 2 diabetes mellitus with complication [facility])     Past Surgical History:  Procedure Laterality Date   CRANIOTOMY N/A 05/26/2020   Procedure: ENDOSCOPIC TRANSPHENOIDAL RESECTION OF TUMOR;  Surgeon: Bedelia Person, MD;  Location: [facility] OR;  Service: Neurosurgery;  Laterality: N/A;   RIGHT/LEFT HEART CATH AND CORONARY ANGIOGRAPHY N/A 12/16/2018   Procedure: RIGHT/LEFT HEART CATH AND CORONARY ANGIOGRAPHY;  Surgeon: Runell Gess, MD;  Location: [facility] INVASIVE CV LAB;  Service: Cardiovascular;  Laterality: N/A;   TRANSPHENOIDAL APPROACH EXPOSURE N/A 05/26/2020   Procedure: TRANSPHENOIDAL APPROACH EXPOSURE;  Surgeon: Osborn Coho, MD;  Location[facility] OR;  Service: ENT;  Laterality: N/A;    Family History  Problem Relation Age of Onset   Hypertension Mother    Heart attack Mother    Hypertension Father    Hypertension Sister    Hypertension Brother     Social History Reviewed with no changes to be made today.   Outpatient Medications Prior to Visit  Medication Sig Dispense Refill   amiodarone (PACERONE) 200 MG tablet Take 0.5 tablets (100 mg total) by mouth daily. 45 tablet 3   glipiZIDE (GLUCOTROL) 10 MG tablet Take 1 tablet (10 mg total) by mouth 2 (two) times daily before a meal. 60 tablet 1   Insulin Pen Needle (PEN NEEDLES) 31G X 5 MM MISC 1 each by Does not apply route daily. 100 each 11   sacubitril-valsartan (ENTRESTO) 24-26 MG Take 1 tablet by mouth 2 (two) times daily. 180 tablet 3   spironolactone (ALDACTONE) 25 MG  tablet Take 1 tablet by mouth once daily 30 tablet 0   Semaglutide,0.25 or 0.5MG /DOS, 2 MG/3ML SOPN Inject 0.5 mg into the skin once a week. (Patient not taking: Reported on 08/24/2023) 9 mL 3   No facility-administered medications prior to visit.    Allergies  Allergen Reactions   Coreg [Carvedilol] Other (See Comments)    Brings heart rate and blood pressure down severly   Farxiga [Dapagliflozin] Nausea Only       Objective:    BP 117/82 (BP Location: Left Arm, Patient Position: Sitting, Cuff Size: Normal)   Pulse 75   Resp 19   Ht 6\' 4"  (1.93 m)   Wt 256 lb 12.8 oz (116.5 kg)   SpO2 100%   BMI 31.26 kg/m  Wt Readings from Last 3 Encounters:  08/24/23 256 lb 12.8 oz (116.5 kg)  02/20/23 259 lb 3.2 oz (117.6 kg)  12/07/22 257 lb 6.4 oz (116.8 kg)    Physical  Exam Vitals and nursing note reviewed.  Constitutional:      Appearance: He is well-developed.  HENT:     Head: Normocephalic and atraumatic.  Cardiovascular:     Rate and Rhythm: Normal rate and regular rhythm.     Heart sounds: Normal heart sounds. No murmur heard.    No friction rub. No gallop.  Pulmonary:     Effort: Pulmonary effort is normal. No tachypnea or respiratory distress.     Breath sounds: Normal breath sounds. No decreased breath sounds, wheezing, rhonchi or rales.  Chest:     Chest wall: No tenderness.  Abdominal:     General: Bowel sounds are normal.     Palpations: Abdomen is soft.  Musculoskeletal:        General: Normal range of motion.     Cervical back: Normal range of motion.  Skin:    General: Skin is warm and dry.  Neurological:     Mental Status: He is alert and oriented to person, place, and time.     Coordination: Coordination normal.  Psychiatric:        Behavior: Behavior normal. Behavior is cooperative.        Thought Content: Thought content normal.        Judgment: Judgment normal.          Patient has been counseled extensively about nutrition and exercise as  well as the importance of adherence with medications and regular follow-up. The patient was given clear instructions to go to ER or return to medical center if symptoms don't improve, worsen or new problems develop. The patient verbalized understanding.   Follow-up: Return in about 3 months (around 11/24/2023).   [name], FNP-BC [facility] [place], [place] [phone]   08/24/2023, 10:36 AM

## 2023-08-26 ENCOUNTER — Encounter: Payer: Self-pay | Admitting: Nurse Practitioner

## 2023-08-27 LAB — CMP14+EGFR
ALT: 17 IU/L (ref 0–44)
AST: 19 IU/L (ref 0–40)
Albumin: 4.2 g/dL (ref 3.8–4.9)
Alkaline Phosphatase: 112 IU/L (ref 44–121)
BUN/Creatinine Ratio: 10 (ref 9–20)
BUN: 13 mg/dL (ref 6–24)
Bilirubin Total: 0.8 mg/dL (ref 0.0–1.2)
CO2: 26 mmol/L (ref 20–29)
Calcium: 9.8 mg/dL (ref 8.7–10.2)
Chloride: 97 mmol/L (ref 96–106)
Creatinine, Ser: 1.3 mg/dL — ABNORMAL HIGH (ref 0.76–1.27)
Globulin, Total: 2.8 g/dL (ref 1.5–4.5)
Glucose: 334 mg/dL — ABNORMAL HIGH (ref 70–99)
Potassium: 5.2 mmol/L (ref 3.5–5.2)
Sodium: 137 mmol/L (ref 134–144)
Total Protein: 7 g/dL (ref 6.0–8.5)
eGFR: 63 mL/min/{1.73_m2} (ref >59)

## 2023-08-27 LAB — CBC WITH DIFFERENTIAL/PLATELET
Basophils Absolute: 0 10*3/uL (ref 0.0–0.2)
Basos: 0 %
EOS (ABSOLUTE): 0.1 10*3/uL (ref 0.0–0.4)
Eos: 2 %
Hematocrit: 51.1 % — ABNORMAL HIGH (ref 37.5–51.0)
Hemoglobin: 17 g/dL (ref 13.0–17.7)
Immature Grans (Abs): 0 10*3/uL (ref 0.0–0.1)
Immature Granulocytes: 0 %
Lymphocytes Absolute: 1.7 10*3/uL (ref 0.7–3.1)
Lymphs: 30 %
MCH: 30.2 pg (ref 26.6–33.0)
MCHC: 33.3 g/dL (ref 31.5–35.7)
MCV: 91 fL (ref 79–97)
Monocytes Absolute: 0.6 10*3/uL (ref 0.1–0.9)
Monocytes: 10 %
Neutrophils Absolute: 3.2 10*3/uL (ref 1.4–7.0)
Neutrophils: 58 %
Platelets: 213 10*3/uL (ref 150–450)
RBC: 5.62 x10E6/uL (ref 4.14–5.80)
RDW: 12.8 % (ref 11.6–15.4)
WBC: 5.6 10*3/uL (ref 3.4–10.8)

## 2023-08-27 LAB — MICROALBUMIN / CREATININE URINE RATIO
Creatinine, Urine: 94.1 mg/dL
Microalb/Creat Ratio: 48 mg/g{creat} — ABNORMAL HIGH (ref 0–29)
Microalbumin, Urine: 48 ug/mL — ABNORMAL HIGH

## 2023-08-27 LAB — HEMOGLOBIN A1C
Est. average glucose Bld gHb Est-mCnc: 369 mg/dL
Hgb A1c MFr Bld: 14.5 % — ABNORMAL HIGH (ref 4.8–5.6)

## 2023-10-02 ENCOUNTER — Telehealth (HOSPITAL_COMMUNITY): Payer: Self-pay | Admitting: Pharmacy Technician

## 2023-10-02 ENCOUNTER — Encounter (HOSPITAL_COMMUNITY): Payer: Self-pay

## 2023-10-02 ENCOUNTER — Other Ambulatory Visit (HOSPITAL_COMMUNITY): Payer: Self-pay

## 2023-10-02 DIAGNOSIS — I5022 Chronic systolic (congestive) heart failure: Secondary | ICD-10-CM

## 2023-10-02 DIAGNOSIS — I10 Essential (primary) hypertension: Secondary | ICD-10-CM

## 2023-10-02 NOTE — Telephone Encounter (Signed)
 Advanced Heart Failure Patient Advocate Encounter  Received notification that it is time to renew the patients Entresto  PA. Test claim states that the [facility] information we have on file is terminated. Sent patient mychart message inquiring if new insurance has been obtained.  If not, will pursue assistance through [employer].   Will follow up.

## 2023-10-03 NOTE — Telephone Encounter (Signed)
 Entresto  samples left at front desk for pt  Medication Samples have been provided to the patient.  Drug name: entresto        Strength: 24/26mg         Qty: 2  LOT: [id number], [id number]  Exp.Date: 07/2024, 07/12/24  Dosing instructions: take 1 tab Twice daily   The patient has been instructed regarding the correct time, dose, and frequency of taking this medication, including desired effects and most common side effects.   Kevin Hampton 11:29 AM 10/03/2023

## 2023-10-03 NOTE — Telephone Encounter (Signed)
 Advanced Heart Failure Patient Advocate Encounter  Patient states that he has new insurance starting in May. Will provide samples in the meantime.  Correne Dillon, CPhT

## 2023-10-30 ENCOUNTER — Other Ambulatory Visit: Payer: Self-pay

## 2023-11-01 ENCOUNTER — Other Ambulatory Visit: Payer: Self-pay

## 2023-11-02 ENCOUNTER — Telehealth (HOSPITAL_COMMUNITY): Payer: Self-pay

## 2023-11-02 ENCOUNTER — Other Ambulatory Visit (HOSPITAL_COMMUNITY): Payer: Self-pay

## 2023-11-02 ENCOUNTER — Telehealth (HOSPITAL_COMMUNITY): Payer: Self-pay | Admitting: Internal Medicine

## 2023-11-02 NOTE — Telephone Encounter (Signed)
 Prior auth completed in separate encounter, informed pt by phone and confirmed pt has copay card .

## 2023-11-02 NOTE — Telephone Encounter (Signed)
 Patient came to front office and let front office know that he ran out of his "Entresto " medication last night - - he is all out.   Patient request to be sent to the [facility] pharmacy in [place], [place].   This patient would like for someone to give him a telephone call at [phone].   Thank you.

## 2023-11-02 NOTE — Telephone Encounter (Signed)
 Advanced Heart Failure Patient Advocate Encounter  Prior authorization for Entresto  has been submitted and approved. Test billing returns $655.42 for 30 day supply. This plan limits 30 day refill with [facility], but will cover 90 day supply with a preferred pharmacy. This patient is eligible to use copay savings cards.  Key: [id number] Effective: 11/02/2023 to 11/01/2024  Kennis Peacock, CPhT Rx Patient Advocate Phone: [phone]

## 2023-11-02 NOTE — Telephone Encounter (Signed)
 Message to pharmacy team to assist PA is not needed/already in process  per pharm team Samples provided

## 2023-11-02 NOTE — Telephone Encounter (Signed)
 Patient was a walk in and needed samples for his Entresto . Patient said his insurance needed a prior authorization. Per CJ  our pharmacy team [facility] has been made aware. Samples were given to patient.  Medication Samples have been provided to the patient.  Drug name: Entresto        Strength: 24/26        Qty: 56  LOT: [id number]  Exp.Date: 06/11/25 and 04/ 2026  Dosing instructions: Patient takes 1 tablet by mouth twice per day.  The patient has been instructed regarding the correct time, dose, and frequency of taking this medication, including desired effects and most common side effects.   Kevin Hampton 4:14 PM 11/02/2023

## 2023-11-03 ENCOUNTER — Other Ambulatory Visit: Payer: Self-pay | Admitting: Nurse Practitioner

## 2023-11-03 DIAGNOSIS — E119 Type 2 diabetes mellitus without complications: Secondary | ICD-10-CM

## 2023-11-14 ENCOUNTER — Telehealth: Payer: Self-pay | Admitting: Internal Medicine

## 2023-11-14 NOTE — Telephone Encounter (Signed)
 Called to confirm/remind patient of their appointment at the [facility] on 11/15/23.   Appointment:   [x] Confirmed  [] Left mess   [] No answer/No voice mail  [] VM Full/unable to leave message  [] Phone not in service  Patient reminded to bring all medications and/or complete list.  Confirmed patient has transportation. Gave directions, instructed to utilize valet parking.

## 2023-11-15 ENCOUNTER — Encounter: Payer: Self-pay | Admitting: Internal Medicine

## 2023-11-15 ENCOUNTER — Other Ambulatory Visit (HOSPITAL_COMMUNITY): Payer: Self-pay | Admitting: Internal Medicine

## 2023-11-15 ENCOUNTER — Ambulatory Visit: Attending: Internal Medicine | Admitting: Internal Medicine

## 2023-11-15 ENCOUNTER — Inpatient Hospital Stay (HOSPITAL_COMMUNITY)
Admission: RE | Admit: 2023-11-15 | Discharge: 2023-11-15 | Disposition: A | Discharge status: Home / Self Care | Source: Ambulatory Visit | Attending: Internal Medicine | Admitting: Internal Medicine

## 2023-11-15 VITALS — BP 121/86 | HR 93 | Wt 259.0 lb

## 2023-11-15 DIAGNOSIS — I10 Essential (primary) hypertension: Secondary | ICD-10-CM

## 2023-11-15 DIAGNOSIS — G4733 Obstructive sleep apnea (adult) (pediatric): Secondary | ICD-10-CM | POA: Insufficient documentation

## 2023-11-15 DIAGNOSIS — I447 Left bundle-branch block, unspecified: Secondary | ICD-10-CM | POA: Insufficient documentation

## 2023-11-15 DIAGNOSIS — E1165 Type 2 diabetes mellitus with hyperglycemia: Secondary | ICD-10-CM | POA: Diagnosis not present

## 2023-11-15 DIAGNOSIS — Z794 Long term (current) use of insulin: Secondary | ICD-10-CM | POA: Insufficient documentation

## 2023-11-15 DIAGNOSIS — E1100 Type 2 diabetes mellitus with hyperosmolarity without nonketotic hyperglycemic-hyperosmolar coma (NKHHC): Secondary | ICD-10-CM | POA: Diagnosis not present

## 2023-11-15 DIAGNOSIS — E220 Acromegaly and pituitary gigantism: Secondary | ICD-10-CM | POA: Diagnosis not present

## 2023-11-15 DIAGNOSIS — R55 Syncope and collapse: Secondary | ICD-10-CM

## 2023-11-15 DIAGNOSIS — D751 Secondary polycythemia: Secondary | ICD-10-CM | POA: Diagnosis not present

## 2023-11-15 DIAGNOSIS — I5022 Chronic systolic (congestive) heart failure: Secondary | ICD-10-CM | POA: Insufficient documentation

## 2023-11-15 DIAGNOSIS — Z79899 Other long term (current) drug therapy: Secondary | ICD-10-CM | POA: Insufficient documentation

## 2023-11-15 DIAGNOSIS — I428 Other cardiomyopathies: Secondary | ICD-10-CM | POA: Insufficient documentation

## 2023-11-15 DIAGNOSIS — R5383 Other fatigue: Secondary | ICD-10-CM | POA: Diagnosis not present

## 2023-11-15 DIAGNOSIS — I110 Hypertensive heart disease with heart failure: Secondary | ICD-10-CM | POA: Diagnosis present

## 2023-11-15 DIAGNOSIS — Z7984 Long term (current) use of oral hypoglycemic drugs: Secondary | ICD-10-CM | POA: Insufficient documentation

## 2023-11-15 DIAGNOSIS — I493 Ventricular premature depolarization: Secondary | ICD-10-CM | POA: Insufficient documentation

## 2023-11-15 DIAGNOSIS — E0590 Thyrotoxicosis, unspecified without thyrotoxic crisis or storm: Secondary | ICD-10-CM | POA: Diagnosis not present

## 2023-11-15 NOTE — Patient Instructions (Addendum)
 Medication Changes:  No medication changes today!  Testing/Procedures:  Your physician has requested that you have an echocardiogram. Echocardiography is a painless test that uses sound waves to create images of your heart. It provides your doctor with information about the size and shape of your heart and how well your heart's chambers and valves are working. This procedure takes approximately one hour. There are no restrictions for this procedure. Please do NOT wear cologne, perfume, aftershave, or lotions (deodorant is allowed). Please arrive 15 minutes prior to your appointment time.  Please note: We ask at that you not bring children with you during ultrasound (echo/ vascular) testing. Due to room size and safety concerns, children are not allowed in the ultrasound rooms during exams. Our front office staff cannot provide observation of children in our lobby area while testing is being conducted. An adult accompanying a patient to their appointment will only be allowed in the ultrasound room at the discretion of the ultrasound technician under special circumstances. We apologize for any inconvenience.   Special Instructions // Education:  Your provider has recommended that  you wear a Zio Patch for 14 days.  This monitor will record your heart rhythm for our review.  IF you have any symptoms while wearing the monitor please press the button.  If you have any issues with the patch or you notice a red or orange light on it please call the company at [phone].  Once you remove the patch please mail it back to the company as soon as possible so we can get the results.   Follow-Up in: Please follow up with the [facility] in 3 months with Dr. Julane Ny and an echocardiogram. We will put you on a recall list, however give us  a call in July to schedule an appointment for September.  At the [facility], you and your health needs are our priority. We have a  designated team specialized in the treatment of Heart Failure. This Care Team includes your primary Heart Failure Specialized Cardiologist (physician), Advanced Practice Providers (APPs- Physician Assistants and Nurse Practitioners), and Pharmacist who all work together to provide you with the care you need, when you need it.   You may see any of the following providers on your designated Care Team at your next follow up:  Dr. Jules Oar Dr. Peder Bourdon Dr. Alwin Baars Dr. Judyth Nunnery Shawnee Dellen, FNP Bevely Brush, RPH-CPP  Please be sure to bring in all your medications bottles to every appointment.   Need to Contact Us :  If you have any questions or concerns before your next appointment please send us  a message through [facility] or call our office at [phone].    TO LEAVE A MESSAGE FOR THE NURSE SELECT OPTION 2, PLEASE LEAVE A MESSAGE INCLUDING: YOUR NAME DATE OF BIRTH CALL BACK NUMBER REASON FOR CALL**this is important as we prioritize the call backs  YOU WILL RECEIVE A CALL BACK THE SAME DAY AS LONG AS YOU CALL BEFORE 4:00 PM

## 2023-11-15 NOTE — Progress Notes (Signed)
 Zio patch placed onto patient.  All instructions and information reviewed with patient, they verbalize understanding with no questions.

## 2023-11-15 NOTE — Progress Notes (Signed)
 Advanced [facility] Failure Clinic Note   PCP: Collins Dean, NP HF Cardiologist: Dr. Julane Ny  HPI: 58 y.o. male with  HTN, poorly-controlled DM2, acromegaly and systolic HF due to NICM.   Admitted 7/20 with HTN urgency and acute systolic HF. ECG with LBBB of uncertain duration. Echo EF 25-30%.Cardiac cath showed normal coronaries with well compensated filling pressures after diuresis.   He was started on GDMT including carvedilol  6.25 bid, Entresto  49/51 bid, spironolactone  12.5 daily. He did well for several days but then developed severe fatigue. I saw him in Clinic on 12/27/18. He was in bigeminy. I stopped his b-blocker and started amio 200 bid.K 5.0 Mg 1.7  Echo 9/21: EF 30-35%, RV ok.  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 Referred to Dr. Aurora Blowers. Underwent transphenoidal pituitary resection in 12/21.  Echo 3/22: EF 20-25% + septal dyssynchrony RV ok.   Echo 5/23 EF 25-30% RV mildly HK  Follow up 03/28/24. Wife passed away from HF on 06-13-21. NYHA II-early III, volume ok. Planning for CRT when DM2 better controlled, referred back to Endo.  Referred back to DR. [name] for evaluation of hyperthyroidism, acromegaly and further treatment of uncontrolled DM2. Saw her once in 6/24.  He was prescribed cabergoline  (did not start) and started on ozempic . Not taking methimazole . He stopped ozempic  d/t side effects. Reports Dr. Ned Balint is not in network for him and he is unable to follow-up. Plans to have endocrine issues managed by PCP.  He is here today for follow-up. I last saw him in 9/24. Echo 9/24 EF 20-25%.  Was unwilling to increase treatment for DM2, HF or thyroid  disease. Says he is doing ok. In the last week of April had an event where he felt the room spinning and was presyncopal and SOB. No CP. Lasted 5 secs and went away. For the next 3 nights had 5 sec episodes of SOB but no presyncope. No further episodes. Still working as a Education officer, environmental. Feels weak at times. No edema, orthopnea or PND.  Gets fatigued walking 5-10 mins. Checking his sugars frequently mostly 200-300.    Cardiac Studies - Echo (5/23): EF 25-30% RV mildly HK  - Echo (3/22): EF 20-25%, + septal dyssynchrony, RV ok  - Zio 5/23: 8 runs NSVT, 3.5% PVCs   - Echo 9/21: EF 30-35%, RV ok.  - cMRI (10/21/19): difficult study due to respiratory artifact and frequent ectopy, LVEF 20-25%, di hypokinesis with basal to mid inferolateral akinesis, normal RVEF and function, dense basal to mid inferolateral scar. This looked most consistent with a prior MI in the LCx territory.   - Echo (3/21): EF 25% RV ok.   - Zio (10/20): 4.8%, PVCs  - Echo (11/19): EF ~35-40% with severe dyssynchrony due to LBBB and PVCs   SH:  Social History   Socioeconomic History   Marital status: Widowed    Spouse name: Valeria Gates   Number of children: 1   Years of education: Not on file   Highest education level: Not on file  Occupational History   Occupation: Minister/carpenter/musician  Tobacco Use   Smoking status: Former   Smokeless tobacco: Never  Advertising account planner   Vaping status: Never Used  Substance and Sexual Activity   Alcohol use: Never   Drug use: Never   Sexual activity: Yes  Other Topics Concern   Not on file  Social History Narrative   Not on file   Social Drivers of Health   Financial Resource Strain:  Medium Risk (08/20/2023)   Overall Financial Resource Strain (CARDIA)    Difficulty of Paying Living Expenses: Somewhat hard  Food Insecurity: Food Insecurity Present (08/20/2023)   Hunger Vital Sign    Worried About Running Out of Food in the Last Year: Sometimes true    Ran Out of Food in the Last Year: Never true  Transportation Needs: No Transportation Needs (08/20/2023)   PRAPARE - Administrator, Civil Service (Medical): No    Lack of Transportation (Non-Medical): No  Physical Activity: Insufficiently Active (08/20/2023)   Exercise Vital Sign    Days of Exercise per Week: 4 days    Minutes of  Exercise per Session: 30 min  Stress: Stress Concern Present (08/20/2023)   [employer] of Occupational Health - Occupational Stress Questionnaire    Feeling of Stress : To some extent  Social Connections: Unknown (08/20/2023)   Social Connection and Isolation Panel [NHANES]    Frequency of Communication with Friends and Family: Patient declined    Frequency of Social Gatherings with Friends and Family: Patient declined    Attends Religious Services: More than 4 times per year    Active Member of [employer] or Organizations: Patient declined    Attends Banker Meetings: Not on file    Marital Status: Widowed  Catering manager Violence: Not on file    FH:  Family History  Problem Relation Age of Onset   Hypertension Mother    Heart attack Mother    Hypertension Father    Hypertension Sister    Hypertension Brother     Past Medical History:  Diagnosis Date   Acromegaly (HCC)    CHF (congestive heart failure) (HCC)    Essential hypertension    Hypertensive emergency 12/15/2018   Nonischemic cardiomyopathy (HCC)    Obesity    Pituitary macroadenoma (HCC)    Systolic heart failure (HCC)    Type 2 diabetes mellitus with complication (HCC)     Current Outpatient Medications  Medication Sig Dispense Refill   amiodarone  (PACERONE ) 200 MG tablet Take 0.5 tablets (100 mg total) by mouth daily. 45 tablet 1   glipiZIDE  (GLUCOTROL ) 10 MG tablet TAKE 1 TABLET BY MOUTH TWICE DAILY BEFORE A MEAL *PLEASE  KEEP  UPCOMING  APPOINTMENT  FOR  ADDITIONAL  REFILLS* 60 tablet 0   insulin  glargine (LANTUS  SOLOSTAR) 100 UNIT/ML Solostar Pen Inject 30 Units into the skin daily. 15 mL PRN   Insulin  Pen Needle (PEN NEEDLES) 31G X 5 MM MISC 1 each by Does not apply route daily. 100 each 11   sacubitril -valsartan  (ENTRESTO ) 24-26 MG Take 1 tablet by mouth 2 (two) times daily. 180 tablet 1   spironolactone  (ALDACTONE ) 25 MG tablet Take 1 tablet (25 mg total) by mouth daily. 90 tablet 1   No  current facility-administered medications for this visit.   BP 121/86   Pulse 93   Wt 259 lb (117.5 kg)   SpO2 99%   BMI 31.53 kg/m   Wt Readings from Last 3 Encounters:  11/15/23 259 lb (117.5 kg)  08/24/23 256 lb 12.8 oz (116.5 kg)  02/20/23 259 lb 3.2 oz (117.6 kg)   PHYSICAL EXAM: General:  Well appearing. No resp difficulty HEENT: normal Neck: supple. no JVD. Carotids 2+ bilat; no bruits. No lymphadenopathy or thryomegaly appreciated. Cor: PMI nondisplaced. Regular rate & rhythm. No rubs, gallops or murmurs. Lungs: clear Abdomen: soft, nontender, nondistended. No hepatosplenomegaly. No bruits or masses. Good bowel sounds. Extremities: no cyanosis,  clubbing, rash, edema Neuro: alert & orientedx3, cranial nerves grossly intact. moves all 4 extremities w/o difficulty. Affect pleasant  ECG: SR with LBBB 158 ms Personally reviewed  ASSESSMENT & PLAN:  1. Chronic systolic HF - diagnosed 7/20 EF ~25-30%. NICM - cath 7/20 with normal coronaries and compensated hemodynamics - suspect he likely has LBBB +/- PVC-induced CM  - Zio patch 10/20 with 4.8% PVC  - Echo 09/03/19: EF ~25% with severe LV dyssynchrony (no improvement with PVC suppression) - cMRI 5/21 EF 20-25% suggestive of PVC/LBBB CM but presence of ? Inferolateral scar is puzzling.  - Echo 9/21: EF 30-35%, severe decrease LV function with dyssynchrony, RV normal - Echo 08/23/20: EF 20-25% + septal dyssynchrony RV ok. - Echo 5/23: EF 25-30% - Zio 5/23: 8 runs NSVT, 3.5% PVCs  - Echo 9/24: EF 20-25%  - PVCs mostly suppressed EF still down. Suspect LBBB CM (QRS 158 ms) - Seen by EP for consideration of CRT. Deferred CRT - Remains NYHA III Not volume overloaded. Does not need daily loop diuretic. - Ideally would like to be more aggressive with GDMT but has trouble tolerating medications.  - Continue Entresto  24/26 mg bid (did not feel well on higher dose). Declines titration - Continue spiro 25 mg daily. - Off Coreg  with  severe fatigue. (Will not re-challenge with Toprol given on-going fatigue, though suspect symptoms in part related to uncontrolled DM2.) - Off Farxiga  due to intolerance (nausea) will not reattempt currently until A1c bettrer controlled. He refuses rechallenge as well  - Given persistent LV dysfunction and NYHA III symptoms I think he will benefit from CRT but will wait until DM2 better controlled first due to infection risk. Recheck A1c. He is willing to proceed - Labs today - Repeat echo in 6 months  2. Frequent PVCs - Amio decreased to 100 daily.  - zio 10/20 4.8%  - Zio 5/23 3.5% PVCs - Can consider switch to mexilitene as needed.  - Amio labs today  3. Presyncope - he feels it was clasic vertigo for him but episodes very brief. Concern for VT - will place Zio   3. HTN - BP ok - GDMT as above  4. LBBB - Discussion as above. Has seen EP and CRT consideration deferred.  - Given persistent LV dysfunction and NYHA II-III symptoms, I think he will benefit from CRT but will wait until DM2 better controlled first (would likel to see A1c < 10) - Recheck A1c today  5. Poorly controlled DM2 - Followed by Endo, Dr. Ned Balint not in network for him. Plans to follow-up with PCP - Last HGBA1c 14.5% in 3/25 - ON glipizide  and insulin  - Unable to tolerate ozempic  or SGLT2. - Recheck A1c  6. Hyperthyroidism - suspect related to amio - had been on methimazole  at one point, now off - Last TSH 0.5  6. Acromegaly - has growth hormone excess - S/p  transphenoid pituitary resection surgery & sinus septoplasty 12/21  7. Fatigue/erythyrocytosis - mild OSA on sleep study 06/24  Jules Oar, MD  9:30 AM

## 2023-11-16 LAB — COMPREHENSIVE METABOLIC PANEL WITH GFR
ALT: 15 IU/L (ref 0–44)
AST: 17 IU/L (ref 0–40)
Albumin: 4.1 g/dL (ref 3.8–4.9)
Alkaline Phosphatase: 102 IU/L (ref 44–121)
BUN/Creatinine Ratio: 12 (ref 9–20)
BUN: 19 mg/dL (ref 6–24)
Bilirubin Total: 0.8 mg/dL (ref 0.0–1.2)
CO2: 20 mmol/L (ref 20–29)
Calcium: 9.9 mg/dL (ref 8.7–10.2)
Chloride: 94 mmol/L — ABNORMAL LOW (ref 96–106)
Creatinine, Ser: 1.56 mg/dL — ABNORMAL HIGH (ref 0.76–1.27)
Globulin, Total: 2.8 g/dL (ref 1.5–4.5)
Glucose: 384 mg/dL — ABNORMAL HIGH (ref 70–99)
Potassium: 5.1 mmol/L (ref 3.5–5.2)
Sodium: 132 mmol/L — ABNORMAL LOW (ref 134–144)
Total Protein: 6.9 g/dL (ref 6.0–8.5)
eGFR: 51 mL/min/{1.73_m2} — ABNORMAL LOW (ref >59)

## 2023-11-16 LAB — CBC
Hematocrit: 54.8 % — ABNORMAL HIGH (ref 37.5–51.0)
Hemoglobin: 18 g/dL — ABNORMAL HIGH (ref 13.0–17.7)
MCH: 31.1 pg (ref 26.6–33.0)
MCHC: 32.8 g/dL (ref 31.5–35.7)
MCV: 95 fL (ref 79–97)
Platelets: 221 10*3/uL (ref 150–450)
RBC: 5.78 x10E6/uL (ref 4.14–5.80)
RDW: 12.9 % (ref 11.6–15.4)
WBC: 5.4 10*3/uL (ref 3.4–10.8)

## 2023-11-16 LAB — T4, FREE: Free T4: 2.28 ng/dL — ABNORMAL HIGH (ref 0.82–1.77)

## 2023-11-16 LAB — T3, FREE: T3, Free: 4.2 pg/mL (ref 2.0–4.4)

## 2023-11-16 LAB — TSH: TSH: 0.409 u[IU]/mL — ABNORMAL LOW (ref 0.450–4.500)

## 2023-11-16 LAB — HEMOGLOBIN A1C
Est. average glucose Bld gHb Est-mCnc: 361 mg/dL
Hgb A1c MFr Bld: 14.2 % — ABNORMAL HIGH (ref 4.8–5.6)

## 2023-11-16 LAB — BRAIN NATRIURETIC PEPTIDE: BNP: 27 pg/mL (ref 0.0–100.0)

## 2023-11-26 ENCOUNTER — Ambulatory Visit: Payer: Self-pay | Attending: Nurse Practitioner | Admitting: Nurse Practitioner

## 2023-11-26 ENCOUNTER — Encounter: Payer: Self-pay | Admitting: Nurse Practitioner

## 2023-11-26 VITALS — BP 115/78 | HR 71 | Resp 19 | Ht 76.0 in | Wt 261.4 lb

## 2023-11-26 DIAGNOSIS — I5022 Chronic systolic (congestive) heart failure: Secondary | ICD-10-CM | POA: Diagnosis not present

## 2023-11-26 DIAGNOSIS — E119 Type 2 diabetes mellitus without complications: Secondary | ICD-10-CM

## 2023-11-26 DIAGNOSIS — Z794 Long term (current) use of insulin: Secondary | ICD-10-CM

## 2023-11-26 DIAGNOSIS — I10 Essential (primary) hypertension: Secondary | ICD-10-CM | POA: Diagnosis not present

## 2023-11-26 MED ORDER — DEXCOM G7 RECEIVER DEVI: 0 refills | Status: DC

## 2023-11-26 MED ORDER — DEXCOM G7 SENSOR MISC
6 refills | Status: DC
Start: 2023-11-26 — End: 2024-02-26

## 2023-11-26 MED ORDER — INSULIN LISPRO (1 UNIT DIAL) 100 UNIT/ML (KWIKPEN)
2.0000 [IU] | PEN_INJECTOR | Freq: Three times a day (TID) | SUBCUTANEOUS | 1 refills | Status: DC
Start: 2023-11-26 — End: 2024-02-18

## 2023-11-26 MED ORDER — GLIPIZIDE 10 MG PO TABS: 10.0000 mg | ORAL_TABLET | Freq: Two times a day (BID) | ORAL | 1 refills | Status: DC

## 2023-11-26 NOTE — Progress Notes (Signed)
 Assessment & Plan:  Kevin Hampton was seen today for hypertension.  Diagnoses and all orders for this visit:  Diabetes mellitus with insulin  therapy (HCC) -     Continuous Glucose Sensor (DEXCOM G7 SENSOR) MISC; Use as instructed. Check blood glucose levels continuously. E11.65 Z79.4 -     Continuous Glucose Receiver (DEXCOM G7 RECEIVER) DEVI; Use as instructed. Check blood glucose levels continuously. E11.65 Z79.4 -     insulin  lispro (HUMALOG KWIKPEN) 100 UNIT/ML KwikPen; Inject 2 Units into the skin with breakfast, with lunch, and with evening meal. For blood sugars 0-150 give 0 units of insulin , 151-200 give 2 units of insulin , 201-250 give 4 units, 251-300 give 6 units, 301-350 give 8 units, 351-400 give 10 units,> 400 give 12 units and call M.D. Discussed hypoglycemia protocol. -     glipiZIDE  (GLUCOTROL ) 10 MG tablet; Take 1 tablet (10 mg total) by mouth 2 (two) times daily before a meal.  Chronic systolic heart failure  Continue cardiology follow up  Primary hypertension Continue all medications as prescribed.  Reminded to bring in blood pressure log for follow  up appointment.  RECOMMENDATIONS: DASH/Mediterranean Diets are healthier choices for HTN.     Patient has been counseled on age-appropriate routine health concerns for screening and prevention. These are reviewed and up-to-date. Referrals have been placed accordingly. Immunizations are up-to-date or declined.    Subjective:   Chief Complaint  Patient presents with   Hypertension    Kevin Hampton 58 y.o. male presents to office today for follow up to HTN  DECLINES COLONOSCOPY   He has a past medical history of Acromegaly ( was initially Followed by Endo but does not want to return to current endo nor does he want me to place referral for second opnion), CHF, Essential hypertension, Hypertensive emergency (12/15/2018), Nonischemic cardiomyopathy, NSTEMI,  LBBB, Obesity, Pituitary macroadenoma (S/P transphenoidal  surgery), Systolic heart failure, and Type 2 diabetes mellitus with complication.   He is followed by Dr. Dasie Epps for heart failure. EF 25-30%   HTN BP at goal today. BP Readings from Last 3 Encounters:  11/26/23 115/78  11/15/23 121/86  08/24/23 117/82    DM 2 Poorly controlled. He is not taking methimazole . Thyroid  level abnormally low. This could be affecting his diabetes. Currently prescribed lantus  30 units daily and glipizide  10 mg BID. Will add humalog with SSI today. Will also order dexcom today for CGM Lab Results  Component Value Date   HGBA1C 14.2 (H) 11/15/2023    Review of Systems  Constitutional:  Negative for fever, malaise/fatigue and weight loss.  HENT: Negative.  Negative for nosebleeds.   Eyes: Negative.  Negative for blurred vision, double vision and photophobia.  Respiratory: Negative.  Negative for cough and shortness of breath.   Cardiovascular: Negative.  Negative for chest pain, palpitations and leg swelling.  Gastrointestinal: Negative.  Negative for heartburn, nausea and vomiting.  Musculoskeletal: Negative.  Negative for myalgias.  Neurological: Negative.  Negative for dizziness, focal weakness, seizures and headaches.  Psychiatric/Behavioral: Negative.  Negative for suicidal ideas.     Past Medical History:  Diagnosis Date   Acromegaly (HCC)    CHF (congestive heart failure) (HCC)    Essential hypertension    Hypertensive emergency 12/15/2018   Nonischemic cardiomyopathy (HCC)    Obesity    Pituitary macroadenoma (HCC)    Systolic heart failure (HCC)    Type 2 diabetes mellitus with complication [facility])     Past Surgical History:  Procedure Laterality  Date   CRANIOTOMY N/A 05/26/2020   Procedure: ENDOSCOPIC TRANSPHENOIDAL RESECTION OF TUMOR;  Surgeon: Selwyn Gelinas, MD;  Location: [facility] OR;  Service: Neurosurgery;  Laterality: N/A;   RIGHT/LEFT HEART CATH AND CORONARY ANGIOGRAPHY N/A 12/16/2018   Procedure: RIGHT/LEFT HEART CATH AND CORONARY  ANGIOGRAPHY;  Surgeon: Avanell Leigh, MD;  Location: [facility] INVASIVE CV LAB;  Service: Cardiovascular;  Laterality: N/A;   TRANSPHENOIDAL APPROACH EXPOSURE N/A 05/26/2020   Procedure: TRANSPHENOIDAL APPROACH EXPOSURE;  Surgeon: Ammon Bales, MD;  Location: [facility] OR;  Service: ENT;  Laterality: N/A;    Family History  Problem Relation Age of Onset   Hypertension Mother    Heart attack Mother    Hypertension Father    Hypertension Sister    Hypertension Brother     Social History Reviewed with no changes to be made today.   Outpatient Medications Prior to Visit  Medication Sig Dispense Refill   amiodarone  (PACERONE ) 200 MG tablet Take 0.5 tablets (100 mg total) by mouth daily. 45 tablet 1   insulin  glargine (LANTUS  SOLOSTAR) 100 UNIT/ML Solostar Pen Inject 30 Units into the skin daily. 15 mL PRN   Insulin  Pen Needle (PEN NEEDLES) 31G X 5 MM MISC 1 each by Does not apply route daily. 100 each 11   sacubitril -valsartan  (ENTRESTO ) 24-26 MG Take 1 tablet by mouth 2 (two) times daily. 180 tablet 1   spironolactone  (ALDACTONE ) 25 MG tablet Take 1 tablet (25 mg total) by mouth daily. 90 tablet 1   glipiZIDE  (GLUCOTROL ) 10 MG tablet TAKE 1 TABLET BY MOUTH TWICE DAILY BEFORE A MEAL *PLEASE  KEEP  UPCOMING  APPOINTMENT  FOR  ADDITIONAL  REFILLS* 60 tablet 0   No facility-administered medications prior to visit.    Allergies  Allergen Reactions   Coreg  [Carvedilol ] Other (See Comments)    Brings heart rate and blood pressure down severly   Farxiga  [Dapagliflozin ] Nausea Only       Objective:    BP 115/78 (BP Location: Left Arm, Patient Position: Sitting, Cuff Size: Normal)   Pulse 71   Resp 19   Ht 6' 4 (1.93 m)   Wt 261 lb 6.4 oz (118.6 kg)   SpO2 100%   BMI 31.82 kg/m  Wt Readings from Last 3 Encounters:  11/26/23 261 lb 6.4 oz (118.6 kg)  11/15/23 259 lb (117.5 kg)  08/24/23 256 lb 12.8 oz (116.5 kg)    Physical Exam Vitals and nursing note reviewed.  Constitutional:       Appearance: He is well-developed.  HENT:     Head: Normocephalic and atraumatic.   Cardiovascular:     Rate and Rhythm: Normal rate and regular rhythm.     Heart sounds: Normal heart sounds. No murmur heard.    No friction rub. No gallop.  Pulmonary:     Effort: Pulmonary effort is normal. No tachypnea or respiratory distress.     Breath sounds: Normal breath sounds. No decreased breath sounds, wheezing, rhonchi or rales.  Chest:     Chest wall: No tenderness.  Abdominal:     General: Bowel sounds are normal.     Palpations: Abdomen is soft.   Musculoskeletal:        General: Normal range of motion.     Cervical back: Normal range of motion.   Skin:    General: Skin is warm and dry.   Neurological:     Mental Status: He is alert and oriented to person, place, and time.  Coordination: Coordination normal.   Psychiatric:        Behavior: Behavior normal. Behavior is cooperative.        Thought Content: Thought content normal.        Judgment: Judgment normal.          Patient has been counseled extensively about nutrition and exercise as well as the importance of adherence with medications and regular follow-up. The patient was given clear instructions to go to ER or return to medical center if symptoms don't improve, worsen or new problems develop. The patient verbalized understanding.   Follow-up: Return in about 3 months (around 02/26/2024).   Collins Dean, FNP-BC [facility] [place], [place] [phone]   11/26/2023, 7:05 PM

## 2023-11-26 NOTE — Patient Instructions (Addendum)
 For blood sugars 1-2 hours after eating: 0-150 give 0 units of insulin , 151-200 give 3 units of insulin , 201-250 give 5 units, 251-300 give 8 units, 301-350 give 10 units, 351-400 give 12 units,> 400 give 12 units and call office

## 2023-11-29 ENCOUNTER — Telehealth: Payer: Self-pay | Admitting: Pharmacy Technician

## 2023-11-29 NOTE — Telephone Encounter (Signed)
 Pharmacy Patient Advocate Encounter   Received notification from CoverMyMeds that prior authorization for Ozempic  (0.25 or 0.5 MG/DOSE) 2MG /3ML pen-injectors is due for renewal.   Insurance verification completed.   The patient is insured through [employer].  Action: Medication has been discontinued. Archived Key: [id number]

## 2023-12-24 NOTE — Addendum Note (Signed)
 Encounter addended by: Debarah Garrison MATSU, RN on: 12/24/2023 11:53 AM  Actions taken: Imaging Exam ended

## 2023-12-26 ENCOUNTER — Ambulatory Visit (HOSPITAL_COMMUNITY)
Admission: RE | Admit: 2023-12-26 | Discharge: 2023-12-26 | Disposition: A | Discharge status: Home / Self Care | Source: Ambulatory Visit | Attending: Cardiovascular Disease | Admitting: Cardiovascular Disease

## 2023-12-26 DIAGNOSIS — I5022 Chronic systolic (congestive) heart failure: Secondary | ICD-10-CM | POA: Diagnosis not present

## 2023-12-26 LAB — ECHOCARDIOGRAM COMPLETE
AR max vel: 2.81 cm2
AV Area VTI: 2.98 cm2
AV Area mean vel: 2.79 cm2
AV Mean grad: 3 mmHg
AV Peak grad: 6.7 mmHg
Ao pk vel: 1.29 m/s
Area-P 1/2: 3.91 cm2
Est EF: <20
MV VTI: 3.31 cm2
S' Lateral: 5 cm

## 2024-01-17 NOTE — Progress Notes (Signed)
 CARDIOLOGY  Asked to re-evaluate for recurrent NSVT. One 6-beat run of NSVT noted on telemetry (asymptomatic, hemodynamically stable). Serum potassium 4.6 and magnesium  1.7. Consulted on 01/13/2024 for the same; seen by Dr. Pura. He remains on amiodarone  200 mg PO daily + Toprol-XL 25 mg daily.   Recent cardioembolic-appearing strokes with some minor hemorrhagic conversion. TEE planned 01/17/2024 due to suspected MV endocarditis (TTE on 01/14/2024 with suspicious mobile mass on posterior leaflet). Remains on antibiotics.   Replace magnesium . Continue amiodarone  + Toprol-XL. TEE as scheduled.   Rosina Mages, PA-C, CAQ-HM

## 2024-01-18 NOTE — Discharge Summary (Signed)
 Physician Discharge Summary  ADMIT DATE: 01/11/2024  Discharge date and time: 01/18/2024   Admitting Physician: [facility]  Discharge Physician: Lang FORBES Baptist, MD MD  Admission Diagnoses:  CVA (cerebral vascular accident) [facility]) [I63.9] Cerebrovascular accident (CVA), unspecified mechanism (HCC) [I63.9]   DISCHARGE DIAGNOSES:   Multiple vascular territory infarcts Loss of vision in peripheral right eye Nonsustained V-tach  Type 2 diabetes Hypertension Left bundle-branch block  Ischemic cardiomyopathy/systolic heart failure with EF 15-20%  Mitral valve mass, 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  Patient presented to the ER for evaluation eye problem.   Patient reports about 8 days ago he lost peripheral vision of his right eye. His visual loss persisted. He followed up today outpatient with Ophthalmology.  He was sent to the ER to rule out a stroke. He denies headache, dizziness, lightheadedness, unilateral weakness, paresthesia, dysphagia, dysarthria.  He has never had similar symptoms.  He denies history of migraines.  He does not take anticoagulation.  He does not smoke.  He is on amiodarone  but is unsure why.   Vitals in the ER were temperature 95, HR 94, RR 16, BP 180/105, SpO2 100% on room air. Labs remarkable for sodium 136, potassium 4.8, CO2 21, glucose 244, creatinine 1.20, calcium  10.1, anion gap 15, WBC 5.3, hemoglobin 18.0, platelets 200. CTA head/neck with and without contrast showed patchy decreased attenuation in the left occipital lobe with loss of gray-white matter. No large vessel occlusion or clinically significant arterial stenosis     HOSPITAL COURSE:   Neurology was consulted.  Her initial thoughts were, " Patient presents from the ophthalmology office with an 8 day history of a right visual field cut without  any other associated symptoms. CT head revealed left PCA infarct with petechial hemorrhage. CT angiogram head and neck without rate limiting stenoses. MRI brain to my review reveals punctate area of restricted diffusion in the lateral inferior left cerebellum, punctate right parietal, subacute left occipital infarct with moderate volume petechial hemorrhage.  No EKG performed.  IV thrombolytics were not administered due to delayed presentation. My concern is whether patient has worsened heart failure and global hypokinesis may have caused cardioembolic stroke that may warrant anticoagulation in the future.  Differential diagnosis could include atrial fibrillation."   Repeat echocardiogram was performed.  It showed:   1. Technically difficult study.  2. Lumason contrast was used to enhance endocardial definition in the left ventricle.  3. The left ventricular cavity size is mildly increased.  4. The left ventricle ejection fraction is estimated to be 15%, which is severe systolic dysfunction.  5. Diastolic function is indeterminate. Severe global hypokinesis.  6. Mildly dilated left atrium.  7. Mildly dilated right atrium.  8. Posterior mitral leaflet appears markedly thickened with a mobile mass that is suspicious for posterior leaflet vegetation.  9. Normal right ventricular size and function. 10. Estimated RVSP is normal. 11. Bubble study shows no evidence of interatrial communication.  Ula was therefore performed for further evaluation.  It showed:  1. Severe LV dysfunction with estimated LVEF of 15-20%. Severe global hypokinesis.  2. RV systolic function appears mildly decreased.  3. Mildly thickened tricuspid aortic valve with no stenosis and trace regurgitation.  4. Mitral valve is mild-to-moderately thickened with bileaflet prolapse, posterior leaflet worse than anterior. There appears to be a 1 cm filamentous  mass that may be attached to the posterior leaflet cords. This would be  unusual for endocarditis but  endocarditis can not be excluded. There is mild-to-moderate mitral regurgitation.  5. Structurally normal tricuspid valve with trivial regurgitation.  Patient was started empirically on vancomycin Rocephin for treatment of possible endocarditis.  Cultures have all been negative.  The mitral valve mass, in final analysis, is not likely to be endocarditis.  Cardiology recommends outpatient follow up with Cardiothoracic surgery to evaluate for excision of this mass.  ID wanted to run some more tests which the patient refused.  She recommends Zyvox and Augmentin for 2 weeks.  She will contact him to arrange a follow up appointment and Will re-evaluate for extending his course of antibiotics at that time.  He is not interested in LifeVest.  For his nonsustained V-tach, Cardiology recommended continuing amiodarone  plus Toprol-XL and replacing his electrolytes.  Patient will be anticoagulated for further stroke prevention.          CONSULTS:  ---------------------------------------------------------------------------------------------        SIGNIFICANT DIAGNOSTIC STUDIES:  ---------------------------------------------------------------------------------------------        PROCEDURES:  ---------------------------------------------------------------------------------------------        Today's Physical Exam is documented separately in the Progress Notes.  Discharge Condition: stable  Disposition:    Discharge meds:    Medication List     START taking these medications    amoxicillin-clavulanate 875-125 MG per tablet Commonly known as: Augmentin Take 1 tablet by mouth in the morning and 1 tablet before bedtime. Do all this for 14 days.   apixaban  5 MG tablet Commonly known as: Eliquis  Take 1 tablet (5 mg total) by mouth in the morning and 1 tablet (5 mg total) before bedtime.   insulin  glargine-yfgn 100 UNIT/ML  injection Commonly known as: Semglee Inject 21 Units under the skin at bedtime.   insulin  lispro 100 UNIT/ML injection Commonly known as: HumaLOG ,AdmeLOG  Inject 7 Units under the skin in the morning and 7 Units at noon and 7 Units in the evening. Inject with meals.   linezolid 600 MG tablet Commonly known as: Zyvox Take 1 tablet (600 mg total) by mouth in the morning and 1 tablet (600 mg total) before bedtime. Do all this for 14 days.   metoprolol succinate 25 MG 24 hr tablet Commonly known as: Toprol-XL Take 1 tablet (25 mg total) by mouth in the morning. Start taking on: January 19, 2024   rosuvastatin 20 MG tablet Commonly known as: Crestor Take 1 tablet (20 mg total) by mouth at bedtime.       CONTINUE taking these medications    amiodarone  200 MG tablet Commonly known as: Pacerone    Entresto  24-26 MG tablet Generic drug: sacubitril -valsartan    glipiZIDE  10 MG tablet Commonly known as: Glucotrol    spironolactone  25 MG tablet Commonly known as: Aldactone          Where to Get Your Medications     These medications were sent to [id number] - [place], [place] - [address]  [address] [place] [address]    Phone: [phone]  amoxicillin-clavulanate 875-125 MG per tablet apixaban  5 MG tablet insulin  glargine-yfgn 100 UNIT/ML injection insulin  lispro 100 UNIT/ML injection linezolid 600 MG tablet metoprolol succinate 25 MG 24 hr tablet rosuvastatin 20 MG tablet      Discharge Procedure Orders  Ambulatory ref to [facility]  Standing Status: Future  Referral Priority: Routine Referral Type: Consultation  Referral Reason: Specialty Services  Required  Requested Specialty: Cardiology  Number of Visits Requested: 1   Discharge Follow-Up With PCP  Order Comments: Follow up with Bensimhon, Toribio [place], MD for hospital follow-up.   Order Specific Question Answer Comments  Make appointment for 1 week     Discharge Follow-Up (Specialty)  Order Comments: Evaluate for excision of mitral valve mass, next available appointment   Order Specific Question Answer Comments  Physician Specialty Cardiothoracic Surgery [[id number]]    Discharge Follow-Up (Specialty)  Order Comments: Strokes   Order Specific Question Answer Comments  Make appointment for 4 Weeks   Physician Specialty Neurology [[id number]]    Discharge Follow-Up (Provider)   Discharge Follow-Up (Specialty)  Order Comments: Heart failure, LifeVest, mitral valve mass.  Next available appointment.   Order Specific Question Answer Comments  Physician Specialty Cardiology [[id number]]         Time spent for discharge day management including final exam, discussion of hospital stay, instructions for continuing care, preparation of discharge records including medicine reconciliation and prescriptions, is, in minutes:  80

## 2024-01-23 ENCOUNTER — Ambulatory Visit (HOSPITAL_COMMUNITY)
Admission: RE | Admit: 2024-01-23 | Discharge: 2024-01-23 | Disposition: A | Discharge status: Home / Self Care | Source: Ambulatory Visit | Attending: Cardiology | Admitting: Cardiology

## 2024-01-23 ENCOUNTER — Encounter (HOSPITAL_COMMUNITY): Payer: Self-pay

## 2024-01-23 VITALS — BP 122/82 | HR 77 | Ht 76.0 in | Wt 249.4 lb

## 2024-01-23 DIAGNOSIS — Z9889 Other specified postprocedural states: Secondary | ICD-10-CM | POA: Insufficient documentation

## 2024-01-23 DIAGNOSIS — I4729 Other ventricular tachycardia: Secondary | ICD-10-CM | POA: Diagnosis not present

## 2024-01-23 DIAGNOSIS — E220 Acromegaly and pituitary gigantism: Secondary | ICD-10-CM | POA: Insufficient documentation

## 2024-01-23 DIAGNOSIS — E0590 Thyrotoxicosis, unspecified without thyrotoxic crisis or storm: Secondary | ICD-10-CM | POA: Diagnosis not present

## 2024-01-23 DIAGNOSIS — Z794 Long term (current) use of insulin: Secondary | ICD-10-CM | POA: Diagnosis not present

## 2024-01-23 DIAGNOSIS — Z7984 Long term (current) use of oral hypoglycemic drugs: Secondary | ICD-10-CM | POA: Diagnosis not present

## 2024-01-23 DIAGNOSIS — I428 Other cardiomyopathies: Secondary | ICD-10-CM | POA: Insufficient documentation

## 2024-01-23 DIAGNOSIS — I3489 Other nonrheumatic mitral valve disorders: Secondary | ICD-10-CM | POA: Diagnosis not present

## 2024-01-23 DIAGNOSIS — Z7982 Long term (current) use of aspirin: Secondary | ICD-10-CM | POA: Diagnosis not present

## 2024-01-23 DIAGNOSIS — I5022 Chronic systolic (congestive) heart failure: Secondary | ICD-10-CM | POA: Insufficient documentation

## 2024-01-23 DIAGNOSIS — E1165 Type 2 diabetes mellitus with hyperglycemia: Secondary | ICD-10-CM | POA: Insufficient documentation

## 2024-01-23 DIAGNOSIS — I493 Ventricular premature depolarization: Secondary | ICD-10-CM | POA: Diagnosis not present

## 2024-01-23 DIAGNOSIS — G4733 Obstructive sleep apnea (adult) (pediatric): Secondary | ICD-10-CM | POA: Insufficient documentation

## 2024-01-23 DIAGNOSIS — Z8673 Personal history of transient ischemic attack (TIA), and cerebral infarction without residual deficits: Secondary | ICD-10-CM | POA: Diagnosis not present

## 2024-01-23 DIAGNOSIS — I447 Left bundle-branch block, unspecified: Secondary | ICD-10-CM | POA: Diagnosis not present

## 2024-01-23 DIAGNOSIS — I110 Hypertensive heart disease with heart failure: Secondary | ICD-10-CM | POA: Insufficient documentation

## 2024-01-23 MED ORDER — APIXABAN 5 MG PO TABS: 5.0000 mg | ORAL_TABLET | Freq: Two times a day (BID) | ORAL | 11 refills | Status: DC

## 2024-01-23 NOTE — Progress Notes (Signed)
 Medication Samples have been provided to the patient.  Drug name: eliquis         Strength: 5mg         Qty: 1 box  LOT: [id number]  Exp.Date: 12/2024  Dosing instructions: take 1 tablet twice daily   The patient has been instructed regarding the correct time, dose, and frequency of taking this medication, including desired effects and most common side effects.   Jewelianna Pancoast B Janelli Welling 2:28 PM 01/23/2024

## 2024-01-23 NOTE — Progress Notes (Signed)
 Advanced Heart Failure Clinic Note   PCP: [facility] ORN, NP HF Cardiologist: [facility]  Reason for Visit: f/u for systolic heart failure   HPI: 58 y.o. male with  HTN, poorly-controlled DM2, acromegaly and systolic HF due to NICM.   Admitted 7/20 with HTN urgency and acute systolic HF. ECG with LBBB of uncertain duration. Echo EF 25-30%. Cardiac cath showed normal coronaries with well compensated filling pressures after diuresis.   He was started on GDMT including carvedilol  6.25 bid, Entresto  49/51 bid, spironolactone  12.5 daily. He did well for several days but then developed severe fatigue. He was seen in Clinic on 12/27/18. He was in bigeminy. His b-blocker was stopped and he was started amio 200 bid. K 5.0 Mg 1.7  Echo 9/21: EF 30-35%, RV ok.  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 Referred to [facility]. Underwent transphenoidal pituitary resection in 12/21.  Echo 3/22: EF 20-25% + septal dyssynchrony RV ok.   Echo 5/23 EF 25-30% RV mildly HK  Follow up 04/02/24. Wife passed away from HF on 18-Jun-2021. NYHA II-early III, volume ok. Planning for CRT when DM2 better controlled, referred back to Endo.  Referred back to [facility] for evaluation of hyperthyroidism, acromegaly and further treatment of uncontrolled DM2. Saw her once in 6/24.  He was prescribed cabergoline  (did not start) and started on ozempic . Not taking methimazole . He stopped ozempic  d/t side effects. Reports Kevin [facility] is not in network for him and he is unable to follow-up. Plans to have endocrine issues managed by PCP.  Had f/u 9/24. Echo showed EF 20-25%.  Was unwilling to increase treatment for DM2, HF or thyroid  disease.   Last seen by Kevin [facility] 6/25. Remained NYHA III, volume status was ok. Pt noted h/o intolerance to numerous HF GDMT meds and declined attempts at retrial (see below). He had also endorsed presyncopal episodes. Zio was placed, this showed predominately NSR w/ 11 ventricular tachycardia  runs captured, the longest was 15 beats. Isolated PVCs were few ~1.6%. No AFib. Echo was also ordered, EF slightly lower at 15-20%. CRT-D was again recommended but felt best to wait until DM was under better control (Hgb A1c was 14.5).    He had recent admission, at an outside hospital [facility] in [name], [name]) from 01/11/24-01/18/24 for CVA (records in Care Everywhere). He was sent to the ED from an opthalmology office, after he had presented there w/ an 8 day history of loss of peripheral vision out of Rt eye. CT head revealed left PCA infarct with petechial hemorrhage. CT angiogram head and neck without rate limiting stenoses. MRI brain revealed punctate area of restricted diffusion in the lateral inferior left cerebellum, punctate right parietal, subacute left occipital infarct with moderate volume petechial hemorrhage.  IV thrombolytics were not administered due to delayed presentation. Echo showed severely reduced EF 15%, w/ severe global HK, bubble study negative. RV normal. The posterior leaflet of the mitral valve appeared markedly thickened w/ a mobile mass suspicious for endocarditis. F/u TEE estimated LVEF of 15-20%, RV mildly reduced. Mitral valve was mild-to-moderately thickened with bileaflet prolapse, posterior leaflet worse than anterior. There appeared to be a 1 cm filamentous mass that may be attached to the posterior leaflet cords, felt to be unusual for endocarditis. He was started empirically on vancomycin and Rocephin. Blood cultures were negative. The mitral valve mass, in final analysis, was felt not likely to be endocarditis. Cardiology recommended outpatient follow up with  Cardiothoracic surgery to evaluate for excision of the mass. Patient was hesitant to proceed w/ this. ID wanted to run some more tests which the patient refused. ID recommended continuation of outpatient abx w/ Zyvox and Augmentin for 2 weeks.   He was also noted to have runs of NSVT throughout admission. This  was treated w/ amio + K and Mg supplementation. They had recommended a LifeVest but pt wanted to wait until following up w/ our practice. Placed on Eliquis  for secondary prevention, as CVA was felt to be most likely cardioembolic, in the setting of severely low EF. There was no observation of atrial fibrillation during the course of his admission but PAF could not be ruled out.   He presents today for hospital f/u. Here w/ his sister-in-law who is a retired [name]. He c/w slight decrease in peripheral vision in rt eye but no other focal deficits. He denies resting dyspnea. He reports stable NYHA IIb symptoms. No CP. He reports occasional dizziness but no syncope. He denies fever and chills.   He did not start Eliquis  as directed. He wanted to follow up in our clinic for a 2nd opinion before starting it. He has been taking ASA.      Cardiac Studies  - Echo (8/25) @ OSH EF 15%   - Echo (7/25) EF 15-20%, LBBB dyssynchrony, Mod MAC w/ trivial MR, RV normal    - Echo (9/24) EF 20-25%  - Echo (5/23): EF 25-30% RV mildly HK  - Echo (3/22): EF 20-25%, + septal dyssynchrony, RV ok  - Zio 5/23: 8 runs NSVT, 3.5% PVCs   - Echo 9/21: EF 30-35%, RV ok.  - cMRI (10/21/19): difficult study due to respiratory artifact and frequent ectopy, LVEF 20-25%, di hypokinesis with basal to mid inferolateral akinesis, normal RVEF and function, dense basal to mid inferolateral scar. This looked most consistent with a prior MI in the LCx territory.   - Echo (3/21): EF 25% RV ok.   - Zio (10/20): 4.8%, PVCs  - Echo (11/19): EF ~35-40% with severe dyssynchrony due to LBBB and PVCs   SH:  Social History   Socioeconomic History   Marital status: Widowed    Spouse name: [name]   Number of children: 1   Years of education: Not on file   Highest education level: Not on file  Occupational History   Occupation: Minister/carpenter/musician  Tobacco Use   Smoking status: Former   Smokeless tobacco: Never  Theatre manager   Vaping status: Never Used  Substance and Sexual Activity   Alcohol use: Never   Drug use: Never   Sexual activity: Yes  Other Topics Concern   Not on file  Social History Narrative   Not on file   Social Drivers of Health   Financial Resource Strain: Medium Risk (08/20/2023)   Overall Financial Resource Strain (CARDIA)    Difficulty of Paying Living Expenses: Somewhat hard  Food Insecurity: No Food Insecurity (01/12/2024)   Received from [facility]   Hunger Vital Sign    Within the past 12 months, you worried that your food would run out before you got the money to buy more.: Never true    Within the past 12 months, the food you bought just didn't last and you didn't have money to get more.: Never true  Transportation Needs: Not At Risk (01/12/2024)   Received from [facility] - Transportation    Lack of Transportation: No  Physical Activity: Insufficiently Active (  08/20/2023)   Exercise Vital Sign    Days of Exercise per Week: 4 days    Minutes of Exercise per Session: 30 min  Stress: Stress Concern Present (08/20/2023)   [employer] of Occupational Health - Occupational Stress Questionnaire    Feeling of Stress : To some extent  Social Connections: Unknown (08/20/2023)   Social Connection and Isolation Panel    Frequency of Communication with Friends and Family: Patient declined    Frequency of Social Gatherings with Friends and Family: Patient declined    Attends Religious Services: More than 4 times per year    Active Member of [employer] or Organizations: Patient declined    Attends Banker Meetings: Not on file    Marital Status: Widowed  Catering manager Violence: Not on file    FH:  Family History  Problem Relation Age of Onset   Hypertension Mother    Heart attack Mother    Hypertension Father    Hypertension Sister    Hypertension Brother     Past Medical History:  Diagnosis Date   Acromegaly (HCC)    CHF (congestive heart  failure) (HCC)    Essential hypertension    Hypertensive emergency 12/15/2018   Nonischemic cardiomyopathy (HCC)    Obesity    Pituitary macroadenoma (HCC)    Systolic heart failure (HCC)    Type 2 diabetes mellitus with complication (HCC)     Current Outpatient Medications  Medication Sig Dispense Refill   amiodarone  (PACERONE ) 200 MG tablet Take 0.5 tablets (100 mg total) by mouth daily. 45 tablet 1   apixaban  (ELIQUIS ) 5 MG TABS tablet Take 1 tablet (5 mg total) by mouth 2 (two) times daily. 60 tablet 11   Continuous Glucose Receiver (DEXCOM G7 RECEIVER) DEVI Use as instructed. Check blood glucose levels continuously. E11.65 Z79.4 1 each 0   Continuous Glucose Sensor (DEXCOM G7 SENSOR) MISC Use as instructed. Check blood glucose levels continuously. E11.65 Z79.4 2 each 6   glipiZIDE  (GLUCOTROL ) 10 MG tablet Take 1 tablet (10 mg total) by mouth 2 (two) times daily before a meal. 180 tablet 1   insulin  glargine (LANTUS  SOLOSTAR) 100 UNIT/ML Solostar Pen Inject 30 Units into the skin daily. 15 mL PRN   insulin  lispro (HUMALOG  KWIKPEN) 100 UNIT/ML KwikPen Inject 2 Units into the skin with breakfast, with lunch, and with evening meal. For blood sugars 0-150 give 0 units of insulin , 151-200 give 2 units of insulin , 201-250 give 4 units, 251-300 give 6 units, 301-350 give 8 units, 351-400 give 10 units,> 400 give 12 units and call M.D. Discussed hypoglycemia protocol. 9 mL 1   Insulin  Pen Needle (PEN NEEDLES) 31G X 5 MM MISC 1 each by Does not apply route daily. 100 each 11   sacubitril -valsartan  (ENTRESTO ) 24-26 MG Take 1 tablet by mouth 2 (two) times daily. 180 tablet 1   spironolactone  (ALDACTONE ) 25 MG tablet Take 1 tablet (25 mg total) by mouth daily. 90 tablet 1   No current facility-administered medications for this encounter.   BP 122/82   Pulse 77   Ht 6' 4 (1.93 m)   Wt 113.1 kg (249 lb 6.4 oz)   SpO2 99%   BMI 30.36 kg/m   Wt Readings from Last 3 Encounters:  01/23/24 113.1 kg  (249 lb 6.4 oz)  11/26/23 118.6 kg (261 lb 6.4 oz)  11/15/23 117.5 kg (259 lb)   Physical Exam  GENERAL: + acromegaly facial features, NAD Lungs- clear CARDIAC:  JVP  not elevated         Normal rate with regular rhythm. No murmurs, no edema.  ABDOMEN: Soft, non-tender, non-distended.  EXTREMITIES: Warm and well perfused.    ECG: Not performed   ASSESSMENT & PLAN:  1. Chronic systolic HF - diagnosed 7/20 EF ~25-30%. NICM - cath 7/20 with normal coronaries and compensated hemodynamics - suspect he likely has LBBB +/- PVC-induced CM  - Zio patch 10/20 with 4.8% PVC  - Echo 09/03/19: EF ~25% with severe LV dyssynchrony (no improvement with PVC suppression) - cMRI 5/21 EF 20-25% suggestive of PVC/LBBB CM but presence of ? Inferolateral scar is puzzling.  - Echo 9/21: EF 30-35%, severe decrease LV function with dyssynchrony, RV normal - Echo 08/23/20: EF 20-25% + septal dyssynchrony RV ok. - Echo 5/23: EF 25-30% - Zio 5/23: 8 runs NSVT, 3.5% PVCs  - Echo 9/24: EF 20-25%  - Echo 7/25 EF 15-20%, RV nl + LBBB dyssynchrony  - Echo 8/25 [facility] [place], [name]) EF 15%, RV nl  - PVCs mostly suppressed EF still down. Suspect LBBB CM, dyssynchrony noted on recent echoes - Seen by EP for consideration of CRT. Deferring until DM under better control. He has had recent near syncope w/ Zio showing frequent runs of NSVT. Runs of VT also noted at recent hospitalization at [facility]. I have recommended LifeVest for primary prevention as bridge to CRT-D. Pt is in agreement. Will order and notify Zoll Rep  - remains NYHA IIb, stable. Euvolemic on exam. Does not need daily loop diuretic  - Ideally would like to be more aggressive with GDMT but has trouble tolerating medications.  - Continue Entresto  24/26 mg bid (did not feel well on higher dose).  - Continue spiro 25 mg daily. - Off Coreg  with severe fatigue. (Will not re-challenge with Toprol given on-going fatigue, though suspect  symptoms in part related to uncontrolled DM2.) - Off Farxiga  due to intolerance (nausea) will not reattempt currently until A1c bettrer controlled. He refuses rechallenge as well  - will need better control of DM to be considered for advanced therapies. Hopefull that he could be a CRT responder if able to get him implanted   2. Frequent PVCs/ NSVT  - Continue amiodarone  100 mg daily. Recent HFTs nl. Check TFTs next visit  - zio 10/20 4.8%  - Zio 5/23 3.5% PVCs - Zio 7/25 1.6% PVCs and 11 runs of VT, the longest was 15 beats, max rate of 214 bpm (avg 150 bpm)  - Arrange LifeVest as bridge to CRT-D    3. HTN - controlled on current regimen  - GDMT as above  4. LBBB - Discussion as above.  - Given persistent LV dysfunction and NYHA II-III symptoms, I think he will benefit from CRT but will wait until DM2 better controlled first (would likel to see A1c < 10)  5. Poorly controlled DM2 - Followed previoulsly by Endo, [facility] not in network for him. Plans to follow-up with PCP - Last HGBA1c 14.1% in 6/25>>improving, down to 13 when checked at OSH. He says blood sugars at home now in the 130s  - ON glipizide  and insulin . Has f/u w/ PCP next month  - Unable to tolerate ozempic  or SGLT2.  6. Hyperthyroidism - suspect related to amio - had been on methimazole  at one point, now off - Last TSH 0.5  7. 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 Fatigue/erythyrocytosis - mild OSA on sleep study 06/24  9. CVA - diagnosed 8/25 in [facility] [place], Loss or rt eye peripheral vision. Not a candidate for IV thrombolytics given delayed presentation  - felt to be cardioembolic, EF severely reduced (15%), Bubble Study negative. PAF not fully ruled out. - Start Eliquis  as prescribed, 5 mg bid. Can stop ASA   10. Mitral Valve Mass - noted on TTE/TEE during w/u for CVA in [place]  - empirically treated w/ abx, though felt to be  low likelihood for endocarditis given negative blood Cx - referral to CT surgery for evaluation for extraction of mass recommended but pt refused  - he denies fever and chills. No worsening HF symptoms. Agree that endocarditis less likely. Suspect valvular calcification as noted on prior echoes    Keep f/u w/ Kevin Hampton next month.   Caffie Shed, PA-C  2:41 PM

## 2024-01-23 NOTE — Patient Instructions (Signed)
 Medication Changes:  STOP ASPIRIN   START ELIQUIS  5MG  TWICE DAILY   Special Instructions // Education:  THE COMPANY WILL REACH OUT TO YOU REGARDING THE LIFE VEST   Follow-Up in: AS SCHEDULED WITH DR. CHERRIE   At the Advanced [facility] Failure Clinic, you and your health needs are our priority. We have a designated team specialized in the treatment of Heart Failure. This Care Team includes your primary Heart Failure Specialized Cardiologist (physician), Advanced Practice Providers (APPs- Physician Assistants and Nurse Practitioners), and Pharmacist who all work together to provide you with the care you need, when you need it.   You may see any of the following providers on your designated Care Team at your next follow up:  Dr. Toribio CHERRIE Dr. Ezra Shuck Dr. Ria Commander Dr. Odis Brownie Greig Mosses, NP Caffie Shed, [place] [facility] [place], [place] Beckey Coe, NP [place] Lee, NP Tinnie Redman, PharmD   Please be sure to bring in all your medications bottles to every appointment.   Need to Contact Us :  If you have any questions or concerns before your next appointment please send us  a message through [place] or call our office at [phone].    TO LEAVE A MESSAGE FOR THE NURSE SELECT OPTION 2, PLEASE LEAVE A MESSAGE INCLUDING: YOUR NAME DATE OF BIRTH CALL BACK NUMBER REASON FOR CALL**this is important as we prioritize the call backs  YOU WILL RECEIVE A CALL BACK THE SAME DAY AS LONG AS YOU CALL BEFORE 4:00 PM

## 2024-02-05 ENCOUNTER — Telehealth (HOSPITAL_COMMUNITY): Payer: Self-pay | Admitting: Cardiology

## 2024-02-05 DIAGNOSIS — E785 Hyperlipidemia, unspecified: Secondary | ICD-10-CM

## 2024-02-05 NOTE — Telephone Encounter (Addendum)
 Patient called to report nausea, rash,weakness, joint pain ,and fatigue since starting 2 new meds eliquis  and crestor.   Reports he did not take both last night nor this morning and reports he cannot continue to take with symptoms    Please advise

## 2024-02-07 NOTE — Telephone Encounter (Signed)
 Pt aware and voiced understanding

## 2024-02-18 ENCOUNTER — Other Ambulatory Visit: Payer: Self-pay | Admitting: Nurse Practitioner

## 2024-02-18 DIAGNOSIS — E119 Type 2 diabetes mellitus without complications: Secondary | ICD-10-CM

## 2024-02-18 NOTE — Telephone Encounter (Signed)
 Copied from CRM [id number]. Topic: Clinical - Medication Refill >> Feb 18, 2024  9:52 AM Rosaria E wrote: Medication:  insulin  lispro (HUMALOG  KWIKPEN) 100 UNIT/ML KwikPen  Has the patient contacted their pharmacy? Yes (Agent: If no, request that the patient contact the pharmacy for the refill. If patient does not wish to contact the pharmacy document the reason why and proceed with request.) (Agent: If yes, when and what did the pharmacy advise?)  This is the patient's preferred pharmacy:  [facility] [address] [place], [place] - [address] [place] [address] Phone: [phone] Fax: [phone]  Is this the correct pharmacy for this prescription? Yes If no, delete pharmacy and type the correct one.   Has the prescription been filled recently? Yes  Is the patient out of the medication? Yes  Has the patient been seen for an appointment in the last year OR does the patient have an upcoming appointment? Yes  Can we respond through MyChart? Yes  Agent: Please be advised that Rx refills may take up to 3 business days. We ask that you follow-up with your pharmacy.

## 2024-02-19 MED ORDER — INSULIN LISPRO (1 UNIT DIAL) 100 UNIT/ML (KWIKPEN): 2.0000 [IU] | PEN_INJECTOR | Freq: Three times a day (TID) | SUBCUTANEOUS | 1 refills | Status: DC

## 2024-02-19 NOTE — Telephone Encounter (Signed)
 Requested Prescriptions  Pending Prescriptions Disp Refills   insulin  lispro (HUMALOG  KWIKPEN) 100 UNIT/ML KwikPen 9 mL 1    Sig: Inject 2 Units into the skin with breakfast, with lunch, and with evening meal. For blood sugars 0-150 give 0 units of insulin , 151-200 give 2 units of insulin , 201-250 give 4 units, 251-300 give 6 units, 301-350 give 8 units, 351-400 give 10 units,> 400 give 12 units and call M.D. Discussed hypoglycemia protocol.     Endocrinology:  Diabetes - Insulins Failed - 02/19/2024  1:03 PM      Failed - HBA1C is between 0 and 7.9 and within 180 days    HbA1c, POC (controlled diabetic range)  Date Value Ref Range Status  11/19/2020 7.1 (A) 0.0 - 7.0 % Final   HbA1c POC (<> result, manual entry)  Date Value Ref Range Status  02/08/2022 >15 4.0 - 5.6 % Final    Comment:    Greater than 15   Hgb A1c MFr Bld  Date Value Ref Range Status  11/15/2023 14.2 (H) 4.8 - 5.6 % Final    Comment:             Prediabetes: 5.7 - 6.4          Diabetes: >6.4          Glycemic control for adults with diabetes: <7.0          Passed - Valid encounter within last 6 months    Recent Outpatient Visits           2 months ago Primary hypertension   [facility] [place]. [facility] Theotis Haze ORN, NP   5 months ago Diabetes mellitus with insulin  therapy [facility])    [facility] [place]. [facility] [place], [place] W, NP   1 year ago Type 2 diabetes mellitus with hyperglycemia, without long-term current use of insulin  [facility])   [facility] [place]. [facility] [place], [place] W, NP   2 years ago Type 2 diabetes mellitus with hyperglycemia, without long-term current use of insulin  [facility])   [facility] [place]. [facility] [place], [place], [place]   2 years ago Type 2 diabetes mellitus with hyperglycemia, without long-term  current use of insulin  [facility])   [facility] [place]. [facility] [place], [place], [place]       Future Appointments             In 1 week Theotis Haze ORN, NP [facility] [place] - [facility] Of [place]. [facility], [place]

## 2024-02-21 ENCOUNTER — Other Ambulatory Visit: Payer: Self-pay | Admitting: Nurse Practitioner

## 2024-02-21 DIAGNOSIS — I10 Essential (primary) hypertension: Secondary | ICD-10-CM

## 2024-02-21 DIAGNOSIS — I5022 Chronic systolic (congestive) heart failure: Secondary | ICD-10-CM

## 2024-02-26 ENCOUNTER — Encounter: Payer: Self-pay | Admitting: Nurse Practitioner

## 2024-02-26 ENCOUNTER — Ambulatory Visit: Attending: Nurse Practitioner | Admitting: Nurse Practitioner

## 2024-02-26 VITALS — BP 122/77 | HR 66 | Resp 19 | Ht 76.0 in | Wt 257.6 lb

## 2024-02-26 DIAGNOSIS — E1165 Type 2 diabetes mellitus with hyperglycemia: Secondary | ICD-10-CM | POA: Diagnosis not present

## 2024-02-26 DIAGNOSIS — I10 Essential (primary) hypertension: Secondary | ICD-10-CM | POA: Diagnosis not present

## 2024-02-26 DIAGNOSIS — Z7901 Long term (current) use of anticoagulants: Secondary | ICD-10-CM | POA: Diagnosis not present

## 2024-02-26 DIAGNOSIS — Z794 Long term (current) use of insulin: Secondary | ICD-10-CM | POA: Diagnosis not present

## 2024-02-26 DIAGNOSIS — Z79899 Other long term (current) drug therapy: Secondary | ICD-10-CM

## 2024-02-26 DIAGNOSIS — Z7984 Long term (current) use of oral hypoglycemic drugs: Secondary | ICD-10-CM

## 2024-02-26 LAB — POCT GLYCOSYLATED HEMOGLOBIN (HGB A1C): Hemoglobin A1C: 9.3 % — AB (ref 4.0–5.6)

## 2024-02-26 MED ORDER — FREESTYLE LIBRE 3 PLUS SENSOR MISC: 6 refills | Status: AC

## 2024-02-26 MED ORDER — FREESTYLE LIBRE 3 READER DEVI
0 refills | Status: AC
Start: 2024-02-26 — End: ?

## 2024-02-26 NOTE — Progress Notes (Signed)
 Assessment & Plan:  Kevin Hampton was seen today for diabetes.  Diagnoses and all orders for this visit:  Type 2 diabetes mellitus with hyperglycemia, without long-term current use of insulin  (HCC) -     POCT glycosylated hemoglobin (Hb A1C) -     Continuous Glucose Receiver (FREESTYLE LIBRE 3 READER) DEVI; Please check blood glucose z79.84 -     Continuous Glucose Sensor (FREESTYLE LIBRE 3 PLUS SENSOR) MISC; Change sensor every 15 days. Z79.84 Previously poorly controlled A1c improved from 14.2 to 9.3. Humalog  with sliding scale effective. Cost of continuous glucose monitor is a barrier. Dietary changes improving glycemic control. - Continue Humalog  insulin  with sliding scale. - Encourage checking with pharmacy about the cost of [employer]. - Continue dietary modifications with reduced carbohydrate intake.  Obesity Recent weight reduction efforts noted with some regain. Engages in physical activity. - Encourage continued weight loss efforts. - Advise 30 minutes of walking for cardiovascular health.  Hypertension Well-controlled with blood pressure within normal limits.   General Health Maintenance Discussed flu vaccination, declined. Encouraged regular physical activity and dietary modifications. DECLINES COLONOSCOPY - Offer flu vaccine.   Patient has been counseled on age-appropriate routine health concerns for screening and prevention. These are reviewed and up-to-date. Referrals have been placed accordingly. Immunizations are up-to-date or declined.    Subjective:   Chief Complaint  Patient presents with   Diabetes    Kevin Hampton 58 y.o. 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    He is followed by Dr. Chet for heart failure. EF 25-30%  DM 2 A1c improved significantly. He has made significant dietary changes, including reducing carbohydrate intake by choosing keto-friendly bread and incorporating more vegetables, eggs, baked chicken, fish, and beef into his meals. These changes have contributed to a reduction in his A1c from 14.2 to 9.3. He is currently using Humalog  insulin , taking two units three times a day with a sliding scale. He checks his blood sugar six to seven times a day, although he does not have a sensor due to cost concerns. He is comfortable with his current method of monitoring his blood sugar and declines for me to order [place].  Weight is gradually trending down although he notes a slight recent gain. For physical activity, he walks around [place] and in his back parking lot Lab Results  Component Value Date   HGBA1C 9.3 (A) 02/26/2024    Lab Results  Component Value Date   HGBA1C 14.2 (H) 11/15/2023    HTN Currently taking amiodarone , Entresto  and Aldactone  as prescribed BP Readings from Last 3 Encounters:  02/26/24 122/77  01/23/24 122/82  11/26/23 115/78      Review of Systems  Constitutional:  Negative for fever, malaise/fatigue and weight loss.  HENT: Negative.  Negative for nosebleeds.   Eyes: Negative.  Negative for blurred vision, double vision and photophobia.  Respiratory: Negative.  Negative for cough and shortness of breath.   Cardiovascular: Negative.  Negative for chest pain, palpitations and leg swelling.  Gastrointestinal: Negative.  Negative for heartburn, nausea  and vomiting.  Musculoskeletal: Negative.  Negative for myalgias.  Neurological: Negative.  Negative for dizziness, focal weakness, seizures and  headaches.  Psychiatric/Behavioral: Negative.  Negative for suicidal ideas.     Past Medical History:  Diagnosis Date   Acromegaly (HCC)    CHF (congestive heart failure) (HCC)    Essential hypertension    Hypertensive emergency 12/15/2018   Nonischemic cardiomyopathy (HCC)    Obesity    Pituitary macroadenoma (HCC)    Systolic heart failure (HCC)    Type 2 diabetes mellitus with complication [facility])     Past Surgical History:  Procedure Laterality Date   CRANIOTOMY N/A 05/26/2020   Procedure: ENDOSCOPIC TRANSPHENOIDAL RESECTION OF TUMOR;  Surgeon: Debby Dorn MATSU, MD;  Location: [facility] OR;  Service: Neurosurgery;  Laterality: N/A;   RIGHT/LEFT HEART CATH AND CORONARY ANGIOGRAPHY N/A 12/16/2018   Procedure: RIGHT/LEFT HEART CATH AND CORONARY ANGIOGRAPHY;  Surgeon: Court Dorn PARAS, MD;  Location: [facility] INVASIVE CV LAB;  Service: Cardiovascular;  Laterality: N/A;   TRANSPHENOIDAL APPROACH EXPOSURE N/A 05/26/2020   Procedure: TRANSPHENOIDAL APPROACH EXPOSURE;  Surgeon: Mable Lenis, MD;  Location: [facility] OR;  Service: ENT;  Laterality: N/A;    Family History  Problem Relation Age of Onset   Hypertension Mother    Heart attack Mother    Hypertension Father    Hypertension Sister    Hypertension Brother     Social History Reviewed with no changes to be made today.   Outpatient Medications Prior to Visit  Medication Sig Dispense Refill   amiodarone  (PACERONE ) 200 MG tablet Take 0.5 tablets (100 mg total) by mouth daily. 45 tablet 1   apixaban  (ELIQUIS ) 5 MG TABS tablet Take 1 tablet (5 mg total) by mouth 2 (two) times daily. 60 tablet 11   glipiZIDE  (GLUCOTROL ) 10 MG tablet Take 1 tablet (10 mg total) by mouth 2 (two) times daily before a meal. 180 tablet 1   insulin  glargine (LANTUS  SOLOSTAR) 100 UNIT/ML Solostar Pen Inject 30 Units into the skin daily. 15 mL PRN   insulin  lispro (HUMALOG  KWIKPEN) 100 UNIT/ML KwikPen Inject 2 Units into the skin with breakfast, with lunch, and with  evening meal. For blood sugars 0-150 give 0 units of insulin , 151-200 give 2 units of insulin , 201-250 give 4 units, 251-300 give 6 units, 301-350 give 8 units, 351-400 give 10 units,> 400 give 12 units and call M.D. Discussed hypoglycemia protocol. 9 mL 1   Insulin  Pen Needle (PEN NEEDLES) 31G X 5 MM MISC 1 each by Does not apply route daily. 100 each 11   sacubitril -valsartan  (ENTRESTO ) 24-26 MG Take 1 tablet by mouth 2 (two) times daily. 180 tablet 1   spironolactone  (ALDACTONE ) 25 MG tablet Take 1 tablet by mouth once daily 90 tablet 0   Continuous Glucose Receiver (DEXCOM G7 RECEIVER) DEVI Use as instructed. Check blood glucose levels continuously. E11.65 Z79.4 (Patient not taking: Reported on 02/26/2024) 1 each 0   Continuous Glucose Sensor (DEXCOM G7 SENSOR) MISC Use as instructed. Check blood glucose levels continuously. E11.65 Z79.4 (Patient not taking: Reported on 02/26/2024) 2 each 6   No facility-administered medications prior to visit.    Allergies  Allergen Reactions   Coreg  [Carvedilol ] Other (See Comments)    Brings heart rate and blood pressure down severly   Farxiga  [Dapagliflozin ] Nausea Only       Objective:    BP 122/77 (BP Location: Left Arm, Patient Position: Sitting, Cuff Size: Normal)   Pulse 66   Resp 19  Ht 6' 4 (1.93 m)   Wt 257 lb 9.6 oz (116.8 kg)   SpO2 98%   BMI 31.36 kg/m  Wt Readings from Last 3 Encounters:  02/26/24 257 lb 9.6 oz (116.8 kg)  01/23/24 249 lb 6.4 oz (113.1 kg)  11/26/23 261 lb 6.4 oz (118.6 kg)    Physical Exam Vitals and nursing note reviewed.  Constitutional:      Appearance: He is well-developed.  HENT:     Head: Normocephalic and atraumatic.  Cardiovascular:     Rate and Rhythm: Normal rate and regular rhythm.     Heart sounds: Normal heart sounds. No murmur heard.    No friction rub. No gallop.  Pulmonary:     Effort: Pulmonary effort is normal. No tachypnea or respiratory distress.     Breath sounds: Normal breath  sounds. No decreased breath sounds, wheezing, rhonchi or rales.  Chest:     Chest wall: No tenderness.  Musculoskeletal:        General: Normal range of motion.     Cervical back: Normal range of motion.  Skin:    General: Skin is warm and dry.  Neurological:     Mental Status: He is alert and oriented to person, place, and time.     Coordination: Coordination normal.  Psychiatric:        Behavior: Behavior normal. Behavior is cooperative.        Thought Content: Thought content normal.        Judgment: Judgment normal.          Patient has been counseled extensively about nutrition and exercise as well as the importance of adherence with medications and regular follow-up. The patient was given clear instructions to go to ER or return to medical center if symptoms don't improve, worsen or new problems develop. The patient verbalized understanding.   Follow-up: Return in about 4 months (around 06/27/2024).   Haze LELON Servant, FNP-BC [facility] [place], [place] [phone]   02/26/2024, 2:09 PM

## 2024-03-03 ENCOUNTER — Encounter: Payer: Self-pay | Admitting: Internal Medicine

## 2024-03-03 ENCOUNTER — Ambulatory Visit: Attending: Internal Medicine | Admitting: Internal Medicine

## 2024-03-03 VITALS — BP 118/65 | HR 76 | Wt 261.2 lb

## 2024-03-03 DIAGNOSIS — I10 Essential (primary) hypertension: Secondary | ICD-10-CM

## 2024-03-03 DIAGNOSIS — E0590 Thyrotoxicosis, unspecified without thyrotoxic crisis or storm: Secondary | ICD-10-CM

## 2024-03-03 DIAGNOSIS — I5022 Chronic systolic (congestive) heart failure: Secondary | ICD-10-CM | POA: Diagnosis present

## 2024-03-03 DIAGNOSIS — I447 Left bundle-branch block, unspecified: Secondary | ICD-10-CM

## 2024-03-03 DIAGNOSIS — I493 Ventricular premature depolarization: Secondary | ICD-10-CM

## 2024-03-03 NOTE — Progress Notes (Signed)
 Advanced Heart Failure Clinic Note   PCP: Theotis Haze ORN, NP HF Cardiologist: Dr. Cherrie  Reason for Visit: f/u for systolic heart failure   HPI: 58 y.o. male with  HTN, poorly-controlled DM2, acromegaly and systolic HF due to NICM.   Admitted 7/20 with HTN urgency and acute systolic HF. ECG with LBBB of uncertain duration. Echo EF 25-30%. Cardiac cath showed normal coronaries with well compensated filling pressures after diuresis.   He was started on GDMT including carvedilol  6.25 bid, Entresto  49/51 bid, spironolactone  12.5 daily. He did well for several days but then developed severe fatigue. He was seen in Clinic on 12/27/18. He was in bigeminy. His b-blocker was stopped and he was started amio 200 bid. K 5.0 Mg 1.7  Echo 9/21: EF 30-35%, RV ok.  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 Referred to Dr. Charol. Underwent transphenoidal pituitary resection in 12/21.  Echo 3/22: EF 20-25% + septal dyssynchrony RV ok.   Echo 5/23 EF 25-30% RV mildly HK  Follow up 03/16/2024. Wife passed away from HF on 06/01/2021. NYHA II-early III, volume ok. Planning for CRT when DM2 better controlled, referred back to Endo.  Referred back to DR. [name] for evaluation of hyperthyroidism, acromegaly and further treatment of uncontrolled DM2. Saw her once in 6/24.  He was prescribed cabergoline  (did not start) and started on ozempic . Not taking methimazole . He stopped ozempic  d/t side effects. Reports Dr. Vianne is not in network for him and he is unable to follow-up. Plans to have endocrine issues managed by PCP.  Had f/u 9/24. Echo showed EF 20-25%.  Was unwilling to increase treatment for DM2, HF or thyroid  disease.   Zio 6/25 NSR w/ 11 ventricular tachycardia runs, the longest was 15 beats. Isolated PVCs were few ~1.6%. No AFib.  Echo 7/25 EF 15-20%. CRT-D was again recommended but felt best to wait until DM was under better control (Hgb A1c was 14.5).    Admitted to [facility] in [place], [name])  from 01/11/24-01/18/24 for CVA.  He was sent to the ED from an opthalmology office, after he had presented there w/ an 8 day history of loss of peripheral vision out of Rt eye. CT head revealed left PCA infarct with petechial hemorrhage. CT angiogram head and neck without rate limiting stenoses. MRI brain revealed punctate area of restricted diffusion in the lateral inferior left cerebellum, punctate right parietal, subacute left occipital infarct with moderate volume petechial hemorrhage.  IV thrombolytics were not administered due to delayed presentation. Echo EF 15%, w/ severe global HK, bubble study negative. RV normal. The posterior leaflet of the mitral valve appeared markedly thickened w/ a mobile mass suspicious for endocarditis. F/u TEE estimated LVEF of 15-20%, RV mildly reduced. Mitral valve was mild-to-moderately thickened with bileaflet prolapse, posterior leaflet worse than anterior. There appeared to be a 1 cm filamentous mass that may be attached to the posterior leaflet cords, felt to be unusual for endocarditis. He was started empirically on vancomycin and Rocephin. Blood cultures were negative. The mitral valve mass, in final analysis, was felt not likely to be endocarditis. . ID recommended continuation of outpatient abx w/ Zyvox and Augmentin for 2 weeks. He was also noted to have runs of NSVT throughout admission. This was treated w/ amio + K and Mg supplementation. They had recommended a LifeVest but pt wanted to wait until following up w/ our practice. Placed on Eliquis  for secondary prevention, as CVA was felt to  be most likely cardioembolic, in the setting of severely low EF. There was no observation of atrial fibrillation during the course of his admission but PAF could not be ruled out.   Here for f/u. Was seen by Caffie Shed PA several weeks ago in our office and doing ok. Lifevest ordered but not covered by insurance. Says he is doing ok. Still having problems with peripheral vision  in R eye. Denies CP or SOB. No edema. Does all his errands without problem No fevers or chills. No palpitations. A1c down to 9.3   Cardiac Studies  - Echo (8/25) @ OSH EF 15%  - Echo (7/25) EF 15-20%, LBBB dyssynchrony, Mod MAC w/ trivial MR, RV normal   - Echo (9/24) EF 20-25% - Echo (5/23): EF 25-30% RV mildly HK - Echo (3/22): EF 20-25%, + septal dyssynchrony, RV ok - Zio 5/23: 8 runs NSVT, 3.5% PVCs  - Echo 9/21: EF 30-35%, RV ok. - cMRI (10/21/19): difficult study due to respiratory artifact and frequent ectopy, LVEF 20-25%, di hypokinesis with basal to mid inferolateral akinesis, normal RVEF and function, dense basal to mid inferolateral scar. This looked most consistent with a prior MI in the LCx territory. - Echo (3/21): EF 25% RV ok.  - Zio (10/20): 4.8%, PVCs - Echo (11/19): EF ~35-40% with severe dyssynchrony due to LBBB and PVCs   SH:  Social History   Socioeconomic History   Marital status: Widowed    Spouse name: [name]   Number of children: 1   Years of education: Not on file   Highest education level: Not on file  Occupational History   Occupation: Minister/carpenter/musician  Tobacco Use   Smoking status: Former   Smokeless tobacco: Never  Vaping Use   Vaping status: Never Used  Substance and Sexual Activity   Alcohol use: Never   Drug use: Never   Sexual activity: Yes  Other Topics Concern   Not on file  Social History Narrative   Not on file   Social Drivers of Health   Financial Resource Strain: Patient Declined (02/22/2024)   Overall Financial Resource Strain (CARDIA)    Difficulty of Paying Living Expenses: Patient declined  Food Insecurity: Patient Declined (02/22/2024)   Hunger Vital Sign    Worried About Running Out of Food in the Last Year: Patient declined    Ran Out of Food in the Last Year: Patient declined  Transportation Needs: Patient Declined (02/22/2024)   PRAPARE - Administrator, Civil Service (Medical): Patient  declined    Lack of Transportation (Non-Medical): Patient declined  Physical Activity: Sufficiently Active (02/22/2024)   Exercise Vital Sign    Days of Exercise per Week: 7 days    Minutes of Exercise per Session: 30 min  Stress: No Stress Concern Present (02/22/2024)   [employer] of Occupational Health - Occupational Stress Questionnaire    Feeling of Stress: Not at all  Social Connections: Unknown (02/22/2024)   Social Connection and Isolation Panel    Frequency of Communication with Friends and Family: More than three times a week    Frequency of Social Gatherings with Friends and Family: Three times a week    Attends Religious Services: Patient declined    Active Member of Clubs or Organizations: Patient declined    Attends Banker Meetings: Not on file    Marital Status: Widowed  Intimate Partner Violence: Not on file    FH:  Family History  Problem Relation Age of Onset  Hypertension Mother    Heart attack Mother    Hypertension Father    Hypertension Sister    Hypertension Brother     Past Medical History:  Diagnosis Date   Acromegaly (HCC)    CHF (congestive heart failure) (HCC)    Essential hypertension    Hypertensive emergency 12/15/2018   Nonischemic cardiomyopathy (HCC)    Obesity    Pituitary macroadenoma (HCC)    Systolic heart failure (HCC)    Type 2 diabetes mellitus with complication (HCC)     Current Outpatient Medications  Medication Sig Dispense Refill   amiodarone  (PACERONE ) 200 MG tablet Take 0.5 tablets (100 mg total) by mouth daily. 45 tablet 1   apixaban  (ELIQUIS ) 5 MG TABS tablet Take 1 tablet (5 mg total) by mouth 2 (two) times daily. 60 tablet 11   Continuous Glucose Receiver (FREESTYLE LIBRE 3 READER) DEVI Please check blood glucose z79.84 1 each 0   Continuous Glucose Sensor (FREESTYLE LIBRE 3 PLUS SENSOR) MISC Change sensor every 15 days. Z79.84 2 each 6   glipiZIDE  (GLUCOTROL ) 10 MG tablet Take 1 tablet (10 mg total)  by mouth 2 (two) times daily before a meal. 180 tablet 1   insulin  glargine (LANTUS  SOLOSTAR) 100 UNIT/ML Solostar Pen Inject 30 Units into the skin daily. 15 mL PRN   insulin  lispro (HUMALOG  KWIKPEN) 100 UNIT/ML KwikPen Inject 2 Units into the skin with breakfast, with lunch, and with evening meal. For blood sugars 0-150 give 0 units of insulin , 151-200 give 2 units of insulin , 201-250 give 4 units, 251-300 give 6 units, 301-350 give 8 units, 351-400 give 10 units,> 400 give 12 units and call M.D. Discussed hypoglycemia protocol. 9 mL 1   Insulin  Pen Needle (PEN NEEDLES) 31G X 5 MM MISC 1 each by Does not apply route daily. 100 each 11   sacubitril -valsartan  (ENTRESTO ) 24-26 MG Take 1 tablet by mouth 2 (two) times daily. 180 tablet 1   spironolactone  (ALDACTONE ) 25 MG tablet Take 1 tablet by mouth once daily 90 tablet 0   No current facility-administered medications for this visit.   BP 118/65 (BP Location: Right Arm, Patient Position: Sitting, Cuff Size: Large)   Pulse 76   Wt 261 lb 4 oz (118.5 kg)   SpO2 99%   BMI 31.80 kg/m   Wt Readings from Last 3 Encounters:  03/03/24 261 lb 4 oz (118.5 kg)  02/26/24 257 lb 9.6 oz (116.8 kg)  01/23/24 249 lb 6.4 oz (113.1 kg)   Physical Exam  GENERAL: + acromegaly facial features, NAD HEENT: normal Neck: supple. no JVD. Carotids 2+ bilat; no bruits. No lymphadenopathy or thryomegaly appreciated. Cor: PMI nondisplaced. Regular rate & rhythm. No rubs, gallops or murmurs. Lungs: clear Abdomen: soft, nontender, nondistended. No hepatosplenomegaly. No bruits or masses. Good bowel sounds. Extremities: no cyanosis, clubbing, rash, edema Neuro: alert & orientedx3, cranial nerves grossly intact. moves all 4 extremities w/o difficulty. Affect pleasant    ECG: Not performed   ASSESSMENT & PLAN:  1. Chronic systolic HF - diagnosed 7/20 EF ~25-30%. NICM - cath 7/20 with normal coronaries and compensated hemodynamics - suspect he likely has LBBB  +/- PVC-induced CM  - Zio patch 10/20 with 4.8% PVC  - Echo 09/03/19: EF ~25% with severe LV dyssynchrony (no improvement with PVC suppression) - cMRI 5/21 EF 20-25% suggestive of PVC/LBBB CM but presence of ? Inferolateral scar is puzzling.  - Echo 9/21: EF 30-35%, severe decrease LV function with dyssynchrony, RV normal -  Echo 08/23/20: EF 20-25% + septal dyssynchrony RV ok. - Echo 5/23: EF 25-30% - Zio 5/23: 8 runs NSVT, 3.5% PVCs  - Echo 9/24: EF 20-25%  - Echo 7/25 EF 15-20%, RV nl + LBBB dyssynchrony  - Echo 8/25 [facility] in [place], [name]) EF 15%, RV nl  - PVCs mostly suppressed EF still down. Suspect LBBB CM, dyssynchrony noted on recent echoes - Seen by EP for consideration of CRT. Deferring until DM under better control. He has had recent near syncope w/ Zio showing frequent runs of NSVT. Runs of VT also noted at recent hospitalization at [facility]. Lifevest recommended but not covered by insurance, - Stable NYHA II  Volume ok (no diuretics) - Ideally would like to be more aggressive with GDMT but has trouble tolerating medications.  - Continue Entresto  24/26 mg bid (did not feel well on higher dose).  - Continue spiro 25 mg daily. - Off Coreg  with severe fatigue. (Will not re-challenge with Toprol given on-going fatigue, though suspect symptoms in part related to uncontrolled DM2.) - Off Farxiga  due to intolerance (nausea). He refuses rechallenge currently - will need better control of DM to be considered for advanced therapies. Hopefull that he could be a CRT responder if able to get him implanted. A1c currently improving. Will continue to follow  2. Frequent PVCs/ NSVT  - Continue amiodarone  100 mg daily. Recent HFTs nl. Check TFTs next visit  - zio 10/20 4.8%  - Zio 5/23 3.5% PVCs - Zio 7/25 1.6% PVCs and 11 runs of VT, the longest was 15 beats, max rate of 214 bpm (avg 150 bpm)  - Insurance denied Lifevest -Possible CRT-D in future   3. HTN - Blood pressure well  controlled. Continue current regimen.   4. LBBB - Discussion as above.  - Given persistent LV dysfunction and NYHA II-III symptoms - If A1c continues to improve will refer to EP at next visit for consideration of CRT-D  5. Poorly controlled DM2 - Followed previoulsly by Endo, Dr. Vianne not in network for him. Plans to follow-up with PCP - HGBA1c 14.1% in 6/25>>improving. A1c down to 9.3 - ON glipizide  and insulin . Has f/u w/ PCP next month  - Unable to tolerate ozempic  or SGLT2.  6. Hyperthyroidism - suspect related to amio - had been on methimazole  at one point, now off - Last TSH 0.5 - Managed by Endo   7. 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 Fatigue/erythyrocytosis - mild OSA on sleep study 06/24  9. CVA - diagnosed 8/25 in [place] [facility] [place], Loss or rt eye peripheral vision. Not a candidate for IV thrombolytics given delayed presentation  - felt to be cardioembolic, EF severely reduced (15%), Bubble Study negative. PAF not fully ruled out. - Continue Eliquis   10. Mitral Valve Mass - noted on TTE/TEE during w/u for CVA in [place]  - empirically treated w/ abx, though felt to be low likelihood for endocarditis given negative blood Cx - Outside hospital refered to CT surgery for evaluation for extraction of mass recommended but pt refused  - This is almost certainly valvular calcification as noted on prior echoes. Can consider cMRI to further characterize as needed   Toribio Fuel, MD  12:01 PM

## 2024-03-03 NOTE — Patient Instructions (Signed)
 Medication Changes:  No medication changes today!    Referrals:  You have been referred to the [facility] Electrophysiology for a AICD. That office will contact you in order to schedule your appointment.    Follow-Up in: Please follow up with the Advanced Heart Failure Clinic in 4 months with Dr. Cherrie. We do not currently have that schedule. Please give us  a call in December in order to schedule your appointment for January.   Thank you for choosing [facility] Advanced Heart Failure Clinic.    At the Advanced Heart Failure Clinic, you and your health needs are our priority. We have a designated team specialized in the treatment of Heart Failure. This Care Team includes your primary Heart Failure Specialized Cardiologist (physician), Advanced Practice Providers (APPs- Physician Assistants and Nurse Practitioners), and Pharmacist who all work together to provide you with the care you need, when you need it.   You may see any of the following providers on your designated Care Team at your next follow up:  Dr. Toribio Cherrie Dr. Ezra Shuck Dr. Ria Commander Dr. Morene Brownie Ellouise Class, FNP Jaun Bash, RPH-CPP  Please be sure to bring in all your medications bottles to every appointment.   Need to Contact Us :  If you have any questions or concerns before your next appointment please send us  a message through [place] or call our office at [phone].    TO LEAVE A MESSAGE FOR THE NURSE SELECT OPTION 2, PLEASE LEAVE A MESSAGE INCLUDING: YOUR NAME DATE OF BIRTH CALL BACK NUMBER REASON FOR CALL**this is important as we prioritize the call backs  YOU WILL RECEIVE A CALL BACK THE SAME DAY AS LONG AS YOU CALL BEFORE 4:00 PM

## 2024-03-28 ENCOUNTER — Encounter: Payer: Self-pay | Admitting: Internal Medicine

## 2024-03-28 ENCOUNTER — Ambulatory Visit: Attending: Internal Medicine | Admitting: Internal Medicine

## 2024-03-28 VITALS — BP 124/87 | HR 74 | Resp 16 | Ht 76.0 in | Wt 269.0 lb

## 2024-03-28 DIAGNOSIS — Z01812 Encounter for preprocedural laboratory examination: Secondary | ICD-10-CM | POA: Diagnosis not present

## 2024-03-28 DIAGNOSIS — I10 Essential (primary) hypertension: Secondary | ICD-10-CM | POA: Diagnosis not present

## 2024-03-28 DIAGNOSIS — I493 Ventricular premature depolarization: Secondary | ICD-10-CM

## 2024-03-28 DIAGNOSIS — I447 Left bundle-branch block, unspecified: Secondary | ICD-10-CM

## 2024-03-28 DIAGNOSIS — I5022 Chronic systolic (congestive) heart failure: Secondary | ICD-10-CM | POA: Diagnosis not present

## 2024-03-28 NOTE — H&P (View-Only) (Signed)
 HPI Kevin Hampton is referred by Dr. RAYNALD for consideration for ICD insertion for primary prevention. He is a pleasant middle aged man with a h/o HTN and non-ischemic CM. He has been treated with GDMT and his EF is 25% with LBBB. He has not had syncope. He has dysynchrony on echo. He has had problems with DM and he had a stroke about 2 months ago. EF at that time was 15%. His hgba1c is now in the 9 range.  Allergies  Allergen Reactions   Coreg  [Carvedilol ] Other (See Comments)    Brings heart rate and blood pressure down severly   Farxiga  [Dapagliflozin ] Nausea Only     Current Outpatient Medications  Medication Sig Dispense Refill   amiodarone  (PACERONE ) 200 MG tablet Take 0.5 tablets (100 mg total) by mouth daily. 45 tablet 1   apixaban  (ELIQUIS ) 5 MG TABS tablet Take 1 tablet (5 mg total) by mouth 2 (two) times daily. 60 tablet 11   Continuous Glucose Receiver (FREESTYLE LIBRE 3 READER) DEVI Please check blood glucose z79.84 1 each 0   Continuous Glucose Sensor (FREESTYLE LIBRE 3 PLUS SENSOR) MISC Change sensor every 15 days. Z79.84 2 each 6   glipiZIDE  (GLUCOTROL ) 10 MG tablet Take 1 tablet (10 mg total) by mouth 2 (two) times daily before a meal. 180 tablet 1   insulin  glargine (LANTUS  SOLOSTAR) 100 UNIT/ML Solostar Pen Inject 30 Units into the skin daily. 15 mL PRN   insulin  lispro (HUMALOG  KWIKPEN) 100 UNIT/ML KwikPen Inject 2 Units into the skin with breakfast, with lunch, and with evening meal. For blood sugars 0-150 give 0 units of insulin , 151-200 give 2 units of insulin , 201-250 give 4 units, 251-300 give 6 units, 301-350 give 8 units, 351-400 give 10 units,> 400 give 12 units and call M.D. Discussed hypoglycemia protocol. 9 mL 1   Insulin  Pen Needle (PEN NEEDLES) 31G X 5 MM MISC 1 each by Does not apply route daily. 100 each 11   sacubitril -valsartan  (ENTRESTO ) 24-26 MG Take 1 tablet by mouth 2 (two) times daily. 180 tablet 1   spironolactone  (ALDACTONE ) 25 MG tablet Take 1  tablet by mouth once daily 90 tablet 0   No current facility-administered medications for this visit.     Past Medical History:  Diagnosis Date   Acromegaly (HCC)    CHF (congestive heart failure) (HCC)    Essential hypertension    Hypertensive emergency 12/15/2018   Nonischemic cardiomyopathy (HCC)    Obesity    Pituitary macroadenoma (HCC)    Systolic heart failure (HCC)    Type 2 diabetes mellitus with complication (HCC)     ROS:   All systems reviewed and negative except as noted in the HPI.   Past Surgical History:  Procedure Laterality Date   CRANIOTOMY N/A 05/26/2020   Procedure: ENDOSCOPIC TRANSPHENOIDAL RESECTION OF TUMOR;  Surgeon: Debby Dorn MATSU, MD;  Location: [facility] OR;  Service: Neurosurgery;  Laterality: N/A;   RIGHT/LEFT HEART CATH AND CORONARY ANGIOGRAPHY N/A 12/16/2018   Procedure: RIGHT/LEFT HEART CATH AND CORONARY ANGIOGRAPHY;  Surgeon: Court Dorn PARAS, MD;  [facility];  Service: Cardiovascular;  Laterality: N/A;   TRANSPHENOIDAL APPROACH EXPOSURE N/A 05/26/2020   Procedure: TRANSPHENOIDAL APPROACH EXPOSURE;  Surgeon: Mable Lenis, MD;  Location: [facility] OR;  Service: ENT;  Laterality: N/A;     Family History  Problem Relation Age of Onset   Hypertension Mother    Heart attack Mother    Hypertension Father  Hypertension Sister    Hypertension Brother      Social History   Socioeconomic History   Marital status: Widowed    Spouse name: [name]   Number of children: 1   Years of education: Not on file   Highest education level: Not on file  Occupational History   Occupation: Minister/carpenter/musician  Tobacco Use   Smoking status: Former   Smokeless tobacco: Never  Vaping Use   Vaping status: Never Used  Substance and Sexual Activity   Alcohol use: Never   Drug use: Never   Sexual activity: Yes  Other Topics Concern   Not on file  Social History Narrative   Not on file   Social Drivers of Health   Financial  Resource Strain: Patient Declined (02/22/2024)   Overall Financial Resource Strain (CARDIA)    Difficulty of Paying Living Expenses: Patient declined  Food Insecurity: Patient Declined (02/22/2024)   Hunger Vital Sign    Worried About Running Out of Food in the Last Year: Patient declined    Ran Out of Food in the Last Year: Patient declined  Transportation Needs: Patient Declined (02/22/2024)   PRAPARE - Administrator, Civil Service (Medical): Patient declined    Lack of Transportation (Non-Medical): Patient declined  Physical Activity: Sufficiently Active (02/22/2024)   Exercise Vital Sign    Days of Exercise per Week: 7 days    Minutes of Exercise per Session: 30 min  Stress: No Stress Concern Present (02/22/2024)   [employer] of Occupational Health - Occupational Stress Questionnaire    Feeling of Stress: Not at all  Social Connections: Unknown (02/22/2024)   Social Connection and Isolation Panel    Frequency of Communication with Friends and Family: More than three times a week    Frequency of Social Gatherings with Friends and Family: Three times a week    Attends Religious Services: Patient declined    Active Member of Clubs or Organizations: Patient declined    Attends Banker Meetings: Not on file    Marital Status: Widowed  Intimate Partner Violence: Not on file     BP 124/87 (BP Location: Left Arm, Patient Position: Sitting, Cuff Size: Large)   Pulse 74   Resp 16   Ht 6' 4 (1.93 m)   Wt 269 lb (122 kg)   SpO2 99%   BMI 32.74 kg/m   Physical Exam:  Well appearing NAD HEENT: Unremarkable Neck:  No JVD, no thyromegally Lymphatics:  No adenopathy Back:  No CVA tenderness Lungs:  Clear HEART:  Regular rate rhythm, no murmurs, no rubs, no clicks Abd:  soft, positive bowel sounds, no organomegally, no rebound, no guarding Ext:  2 plus pulses, no edema, no cyanosis, no clubbing Skin:  No rashes no nodules Neuro:  CN II through XII  intact, motor grossly intact  EKG - nsr with LBBB  Assess/Plan: Non-ischemic CM with LBBB - I have discussed the treatment options. He is on GDMT and has dysynchrony on 2D echo and an EF that varies between 15 and 30%. I have reviewed the indications/risk/benefits/goals/expectations of Biv ICD insertion and he wishes to proceed.   DM - I stressed the importance of following his diet and taking his meds correctly prior to and after his ICD insertion.   Danelle Adelyna Brockman,MD

## 2024-03-28 NOTE — Patient Instructions (Signed)

## 2024-03-28 NOTE — Progress Notes (Signed)
 HPI [name] is referred by Dr. RAYNALD for consideration for ICD insertion for primary prevention. He is a pleasant middle aged man with a h/o HTN and non-ischemic CM. He has been treated with GDMT and his EF is 25% with LBBB. He has not had syncope. He has dysynchrony on echo. He has had problems with DM and he had a stroke about 2 months ago. EF at that time was 15%. His hgba1c is now in the 9 range.  Allergies  Allergen Reactions   Coreg  [Carvedilol ] Other (See Comments)    Brings heart rate and blood pressure down severly   Farxiga  [Dapagliflozin ] Nausea Only     Current Outpatient Medications  Medication Sig Dispense Refill   amiodarone  (PACERONE ) 200 MG tablet Take 0.5 tablets (100 mg total) by mouth daily. 45 tablet 1   apixaban  (ELIQUIS ) 5 MG TABS tablet Take 1 tablet (5 mg total) by mouth 2 (two) times daily. 60 tablet 11   Continuous Glucose Receiver (FREESTYLE LIBRE 3 READER) DEVI Please check blood glucose z79.84 1 each 0   Continuous Glucose Sensor (FREESTYLE LIBRE 3 PLUS SENSOR) MISC Change sensor every 15 days. Z79.84 2 each 6   glipiZIDE  (GLUCOTROL ) 10 MG tablet Take 1 tablet (10 mg total) by mouth 2 (two) times daily before a meal. 180 tablet 1   insulin  glargine (LANTUS  SOLOSTAR) 100 UNIT/ML Solostar Pen Inject 30 Units into the skin daily. 15 mL PRN   insulin  lispro (HUMALOG  KWIKPEN) 100 UNIT/ML KwikPen Inject 2 Units into the skin with breakfast, with lunch, and with evening meal. For blood sugars 0-150 give 0 units of insulin , 151-200 give 2 units of insulin , 201-250 give 4 units, 251-300 give 6 units, 301-350 give 8 units, 351-400 give 10 units,> 400 give 12 units and call M.D. Discussed hypoglycemia protocol. 9 mL 1   Insulin  Pen Needle (PEN NEEDLES) 31G X 5 MM MISC 1 each by Does not apply route daily. 100 each 11   sacubitril -valsartan  (ENTRESTO ) 24-26 MG Take 1 tablet by mouth 2 (two) times daily. 180 tablet 1   spironolactone  (ALDACTONE ) 25 MG tablet Take 1  tablet by mouth once daily 90 tablet 0   No current facility-administered medications for this visit.     Past Medical History:  Diagnosis Date   Acromegaly (HCC)    CHF (congestive heart failure) (HCC)    Essential hypertension    Hypertensive emergency 12/15/2018   Nonischemic cardiomyopathy (HCC)    Obesity    Pituitary macroadenoma (HCC)    Systolic heart failure (HCC)    Type 2 diabetes mellitus with complication (HCC)     ROS:   All systems reviewed and negative except as noted in the HPI.   Past Surgical History:  Procedure Laterality Date   CRANIOTOMY N/A 05/26/2020   Procedure: ENDOSCOPIC TRANSPHENOIDAL RESECTION OF TUMOR;  Surgeon: Kevin Dorn MATSU, MD;  Location: [facility] OR;  Service: Neurosurgery;  Laterality: N/A;   RIGHT/LEFT HEART CATH AND CORONARY ANGIOGRAPHY N/A 12/16/2018   Procedure: RIGHT/LEFT HEART CATH AND CORONARY ANGIOGRAPHY;  Surgeon: Kevin Dorn PARAS, MD;  [facility];  Service: Cardiovascular;  Laterality: N/A;   TRANSPHENOIDAL APPROACH EXPOSURE N/A 05/26/2020   Procedure: TRANSPHENOIDAL APPROACH EXPOSURE;  Surgeon: Kevin Lenis, MD;  Location: [facility] OR;  Service: ENT;  Laterality: N/A;     Family History  Problem Relation Age of Onset   Hypertension Mother    Heart attack Mother    Hypertension Father  Hypertension Sister    Hypertension Brother      Social History   Socioeconomic History   Marital status: Widowed    Spouse name: Kevin Hampton   Number of children: 1   Years of education: Not on file   Highest education level: Not on file  Occupational History   Occupation: Minister/carpenter/musician  Tobacco Use   Smoking status: Former   Smokeless tobacco: Never  Vaping Use   Vaping status: Never Used  Substance and Sexual Activity   Alcohol use: Never   Drug use: Never   Sexual activity: Yes  Other Topics Concern   Not on file  Social History Narrative   Not on file   Social Drivers of Health   Financial  Resource Strain: Patient Declined (02/22/2024)   Overall Financial Resource Strain (CARDIA)    Difficulty of Paying Living Expenses: Patient declined  Food Insecurity: Patient Declined (02/22/2024)   Hunger Vital Sign    Worried About Running Out of Food in the Last Year: Patient declined    Ran Out of Food in the Last Year: Patient declined  Transportation Needs: Patient Declined (02/22/2024)   PRAPARE - Administrator, Civil Service (Medical): Patient declined    Lack of Transportation (Non-Medical): Patient declined  Physical Activity: Sufficiently Active (02/22/2024)   Exercise Vital Sign    Days of Exercise per Week: 7 days    Minutes of Exercise per Session: 30 min  Stress: No Stress Concern Present (02/22/2024)   [employer] of Occupational Health - Occupational Stress Questionnaire    Feeling of Stress: Not at all  Social Connections: Unknown (02/22/2024)   Social Connection and Isolation Panel    Frequency of Communication with Friends and Family: More than three times a week    Frequency of Social Gatherings with Friends and Family: Three times a week    Attends Religious Services: Patient declined    Active Member of Clubs or Organizations: Patient declined    Attends Banker Meetings: Not on file    Marital Status: Widowed  Intimate Partner Violence: Not on file     BP 124/87 (BP Location: Left Arm, Patient Position: Sitting, Cuff Size: Large)   Pulse 74   Resp 16   Ht 6' 4 (1.93 m)   Wt 269 lb (122 kg)   SpO2 99%   BMI 32.74 kg/m   Physical Exam:  Well appearing NAD HEENT: Unremarkable Neck:  No JVD, no thyromegally Lymphatics:  No adenopathy Back:  No CVA tenderness Lungs:  Clear HEART:  Regular rate rhythm, no murmurs, no rubs, no clicks Abd:  soft, positive bowel sounds, no organomegally, no rebound, no guarding Ext:  2 plus pulses, no edema, no cyanosis, no clubbing Skin:  No rashes no nodules Neuro:  CN II through XII  intact, motor grossly intact  EKG - nsr with LBBB  Assess/Plan: Non-ischemic CM with LBBB - I have discussed the treatment options. He is on GDMT and has dysynchrony on 2D echo and an EF that varies between 15 and 30%. I have reviewed the indications/risk/benefits/goals/expectations of Biv ICD insertion and he wishes to proceed.   DM - I stressed the importance of following his diet and taking his meds correctly prior to and after his ICD insertion.   Danelle Adelyna Brockman,MD

## 2024-03-29 LAB — BASIC METABOLIC PANEL WITH GFR
BUN/Creatinine Ratio: 12 (ref 9–20)
BUN: 17 mg/dL (ref 6–24)
CO2: 26 mmol/L (ref 20–29)
Calcium: 10.2 mg/dL (ref 8.7–10.2)
Chloride: 100 mmol/L (ref 96–106)
Creatinine, Ser: 1.46 mg/dL — AB (ref 0.76–1.27)
Glucose: 139 mg/dL — AB (ref 70–99)
Potassium: 5.3 mmol/L — AB (ref 3.5–5.2)
Sodium: 141 mmol/L (ref 134–144)
eGFR: 55 mL/min/1.73 — AB (ref >59)

## 2024-03-29 LAB — CBC
Hematocrit: 52.2 % — ABNORMAL HIGH (ref 37.5–51.0)
Hemoglobin: 17.2 g/dL (ref 13.0–17.7)
MCH: 31 pg (ref 26.6–33.0)
MCHC: 33 g/dL (ref 31.5–35.7)
MCV: 94 fL (ref 79–97)
Platelets: 201 x10E3/uL (ref 150–450)
RBC: 5.55 x10E6/uL (ref 4.14–5.80)
RDW: 13.1 % (ref 11.6–15.4)
WBC: 5.7 x10E3/uL (ref 3.4–10.8)

## 2024-04-02 ENCOUNTER — Telehealth (HOSPITAL_COMMUNITY): Payer: Self-pay

## 2024-04-02 NOTE — Telephone Encounter (Signed)
 Patient diagnosed with CVA on Aug 1 and is scheduled for CRT-D insertion on Nov 5. Please verify if it is OK for him to hold Eliquis  3 days prior as listed in procedure instructions. Thanks

## 2024-04-03 ENCOUNTER — Telehealth: Payer: Self-pay

## 2024-04-03 NOTE — Telephone Encounter (Signed)
 Left detailed message per DPR that there had been changes to his procedure time and instructions. Requested a call back if he would like to go over instructions or had questions.

## 2024-04-04 ENCOUNTER — Telehealth (HOSPITAL_COMMUNITY): Payer: Self-pay

## 2024-04-04 ENCOUNTER — Telehealth: Payer: Self-pay

## 2024-04-04 ENCOUNTER — Encounter (HOSPITAL_COMMUNITY): Payer: Self-pay

## 2024-04-04 NOTE — Telephone Encounter (Signed)
-----   Message from Nurse Charleston B sent at 04/01/2024  9:21 AM EDT ----- Regarding: CRT-D 11/5[place] Patient attached to previous message ----- Message ----- From: Radames Corean SAILOR, RN Sent: 04/01/2024   9:04 AM EDT To: Reia Viernes, CMA; Charleston MALVA Sever, RN;#  Important: list procedure date as first item in subject line, followed by procedure type (e.g., 02/22/24 PPM implant)  Precert:  MD: [place] Type of implant: CRT-D Device manufacturer: Medtronic Diagnosis: Cardiomyopathy CPT code: CRT-D implant - 66750+66774 C-code(s), including quantity (if indicated):  Procedure scheduled (date/time): 04/16/24 - 11  Procedure:  Scrub given? Yes  Medication instructions:  Message sent to CVRR?  Added to calendar? Yes Orders entered? Yes Letter complete? Yes Scheduled with cath lab? Yes Labs ordered (CBC, BMET, PT/INR if on warfarin)? Yes Dye allergy? No Pre-meds ordered and instructions given? Yes Letter method: Patient pick-up Special instructions:  H&P: 03/28/24  Follow-up:  Cassie/Angel, please schedule Routine.

## 2024-04-04 NOTE — Telephone Encounter (Signed)
Ok to hold the eliquis for 3 days

## 2024-04-04 NOTE — Telephone Encounter (Signed)
 Spoke with patient to discuss upcoming procedure.   Confirmed patient is scheduled for a Biventricular implantable cardioverter defibrillator on Wednesday, November 5 with Dr. Dr. Waddell. Instructed patient to arrive at the Main Entrance A at [facility]: [address] [place], [place] [address] and check in at Admitting at 9:00 AM.   Labs completed  Any recent signs of acute illness or been started on antibiotics? No  Any new medications started? No Any medications to hold? Yes   HOLD Eliquis  for 3 days prior to your procedure. Last dose on November 1.  Glipizide : If you take at Dinner/Bedtime- DO NOT take the evening before your procedure. DO NOT take the morning of your procedure.   Lantus  (Insulin ) - If you take at Dinner/Bedtime - ONLY take 1/2 of your usual dose the night before your procedure. If you take in the Morning - ONLY take 1/2 of your usual dose the morning of your procedure.  Humalog  (Insulin ): No bedtime dose of Humalog  the night before your procedure.  Day of procedure: Only take 1/2 of usual dose of Humalog  if CBG greater than 220 mg/dL. HOLD Spironolactone  the morning of your procedure. Medication instructions:  On the morning of your procedure you may take all other morning medications not discussed with a sip of water.  No eating or drinking after midnight prior to procedure.   The night before your procedure and the morning of your procedure, wash thoroughly with the CHG surgical soap from the neck down, paying special attention to the area where your procedure will be performed.  Advised of plan to go home the same day and will only stay overnight if medically necessary. You MUST have a responsible adult to drive you home and MUST be with you the first 24 hours after you arrive home.  Informed patient a nurse will call a day before the procedure to confirm arrival time and ensure instructions are followed.  Patient verbalized understanding to all instructions  provided and agreed to proceed with procedure.

## 2024-04-10 ENCOUNTER — Other Ambulatory Visit: Payer: Self-pay | Admitting: Nurse Practitioner

## 2024-04-10 DIAGNOSIS — I10 Essential (primary) hypertension: Secondary | ICD-10-CM

## 2024-04-10 DIAGNOSIS — I5022 Chronic systolic (congestive) heart failure: Secondary | ICD-10-CM

## 2024-04-15 NOTE — Pre-Procedure Instructions (Signed)
 Instructed patient on the following items: Arrival time 0900 Nothing to eat or drink after midnight No meds AM of procedure Responsible person to drive you home and stay with you for 24 hrs Wash with special soap night before and morning of procedure If on anti-coagulant drug instructions Eliquis - last dose 11/2

## 2024-04-16 ENCOUNTER — Ambulatory Visit (HOSPITAL_COMMUNITY)

## 2024-04-16 ENCOUNTER — Ambulatory Visit (HOSPITAL_COMMUNITY)
Admission: RE | Disposition: A | Discharge status: Home / Self Care | Payer: Self-pay | Source: Home / Self Care | Attending: Internal Medicine

## 2024-04-16 ENCOUNTER — Ambulatory Visit (HOSPITAL_COMMUNITY)
Admission: RE | Admit: 2024-04-16 | Discharge: 2024-04-16 | Disposition: A | Discharge status: Home / Self Care | Attending: Internal Medicine | Admitting: Internal Medicine

## 2024-04-16 DIAGNOSIS — Z79899 Other long term (current) drug therapy: Secondary | ICD-10-CM | POA: Diagnosis not present

## 2024-04-16 DIAGNOSIS — I4892 Unspecified atrial flutter: Secondary | ICD-10-CM | POA: Diagnosis not present

## 2024-04-16 DIAGNOSIS — I429 Cardiomyopathy, unspecified: Secondary | ICD-10-CM

## 2024-04-16 DIAGNOSIS — Z7984 Long term (current) use of oral hypoglycemic drugs: Secondary | ICD-10-CM | POA: Diagnosis not present

## 2024-04-16 DIAGNOSIS — I428 Other cardiomyopathies: Secondary | ICD-10-CM | POA: Insufficient documentation

## 2024-04-16 DIAGNOSIS — E119 Type 2 diabetes mellitus without complications: Secondary | ICD-10-CM | POA: Diagnosis not present

## 2024-04-16 DIAGNOSIS — I110 Hypertensive heart disease with heart failure: Secondary | ICD-10-CM | POA: Diagnosis not present

## 2024-04-16 DIAGNOSIS — I480 Paroxysmal atrial fibrillation: Secondary | ICD-10-CM | POA: Diagnosis not present

## 2024-04-16 DIAGNOSIS — Z794 Long term (current) use of insulin: Secondary | ICD-10-CM | POA: Diagnosis not present

## 2024-04-16 DIAGNOSIS — I447 Left bundle-branch block, unspecified: Secondary | ICD-10-CM | POA: Insufficient documentation

## 2024-04-16 DIAGNOSIS — Z87891 Personal history of nicotine dependence: Secondary | ICD-10-CM | POA: Insufficient documentation

## 2024-04-16 DIAGNOSIS — Z8673 Personal history of transient ischemic attack (TIA), and cerebral infarction without residual deficits: Secondary | ICD-10-CM | POA: Insufficient documentation

## 2024-04-16 DIAGNOSIS — I5022 Chronic systolic (congestive) heart failure: Secondary | ICD-10-CM | POA: Insufficient documentation

## 2024-04-16 HISTORY — PX: BIV ICD INSERTION CRT-D: EP1195

## 2024-04-16 LAB — GLUCOSE, CAPILLARY: Glucose-Capillary: 172 mg/dL — ABNORMAL HIGH (ref 70–99)

## 2024-04-16 SURGERY — BIV ICD INSERTION CRT-D
Anesthesia: LOCAL

## 2024-04-16 MED ORDER — ACETAMINOPHEN 325 MG PO TABS: 325.0000 mg | ORAL_TABLET | ORAL | Status: DC | PRN

## 2024-04-16 MED ORDER — SODIUM CHLORIDE 0.9 % IV SOLN
INTRAVENOUS | Status: AC
  Filled 2024-04-16: qty 2

## 2024-04-16 MED ORDER — MIDAZOLAM HCL 2 MG/2ML IJ SOLN
INTRAMUSCULAR | Status: AC
  Filled 2024-04-16: qty 2

## 2024-04-16 MED ORDER — CEFAZOLIN SODIUM-DEXTROSE 2-4 GM/100ML-% IV SOLN
INTRAVENOUS | Status: AC
  Filled 2024-04-16: qty 100

## 2024-04-16 MED ORDER — HEPARIN (PORCINE) IN NACL 1000-0.9 UT/500ML-% IV SOLN
INTRAVENOUS | Status: DC | PRN
  Administered 2024-04-16: 500 mL

## 2024-04-16 MED ORDER — CHLORHEXIDINE GLUCONATE 4 % EX SOLN: 4.0000 | Freq: Once | CUTANEOUS | Status: DC

## 2024-04-16 MED ORDER — CEFAZOLIN SODIUM-DEXTROSE 2-4 GM/100ML-% IV SOLN
2.0000 g | Freq: Once | INTRAVENOUS | Status: AC
  Administered 2024-04-16: 2 g via INTRAVENOUS

## 2024-04-16 MED ORDER — MIDAZOLAM HCL 5 MG/5ML IJ SOLN
INTRAMUSCULAR | Status: DC | PRN
  Administered 2024-04-16 (×7): 1 mg via INTRAVENOUS
  Administered 2024-04-16: 2 mg via INTRAVENOUS

## 2024-04-16 MED ORDER — FENTANYL CITRATE (PF) 100 MCG/2ML IJ SOLN
INTRAMUSCULAR | Status: AC
  Filled 2024-04-16: qty 2

## 2024-04-16 MED ORDER — SODIUM CHLORIDE 0.9 % IV SOLN
80.0000 mg | INTRAVENOUS | Status: AC
  Administered 2024-04-16: 80 mg

## 2024-04-16 MED ORDER — ONDANSETRON HCL 4 MG/2ML IJ SOLN: 4.0000 mg | Freq: Four times a day (QID) | INTRAMUSCULAR | Status: DC | PRN

## 2024-04-16 MED ORDER — LIDOCAINE HCL (PF) 1 % IJ SOLN
INTRAMUSCULAR | Status: DC | PRN
  Administered 2024-04-16: 45 mL

## 2024-04-16 MED ORDER — SODIUM CHLORIDE 0.9 % IV SOLN: INTRAVENOUS | Status: DC

## 2024-04-16 MED ORDER — IODIXANOL 320 MG/ML IV SOLN
INTRAVENOUS | Status: DC | PRN
  Administered 2024-04-16: 30 mL

## 2024-04-16 MED ORDER — FENTANYL CITRATE (PF) 100 MCG/2ML IJ SOLN
INTRAMUSCULAR | Status: DC | PRN
  Administered 2024-04-16 (×6): 12.5 ug via INTRAVENOUS
  Administered 2024-04-16: 25 ug via INTRAVENOUS

## 2024-04-16 MED ORDER — CEFAZOLIN SODIUM-DEXTROSE 2-4 GM/100ML-% IV SOLN
2.0000 g | INTRAVENOUS | Status: AC
  Administered 2024-04-16: 2 g via INTRAVENOUS

## 2024-04-16 MED ORDER — LIDOCAINE HCL (PF) 1 % IJ SOLN
INTRAMUSCULAR | Status: AC
  Filled 2024-04-16: qty 90

## 2024-04-16 MED ORDER — POVIDONE-IODINE 10 % EX SWAB
2.0000 | Freq: Once | CUTANEOUS | Status: AC
  Administered 2024-04-16: 2 via TOPICAL

## 2024-04-16 SURGICAL SUPPLY — 15 items
CABLE SURGICAL S-101-97-12 (CABLE) ×1 IMPLANT
CATH ATTAIN COM SURV 6250V-EH (CATHETERS) IMPLANT
CATH ATTAIN COM SURV 6250V-MB2 (CATHETERS) IMPLANT
CATH ATTAIN SEL SURV 6248V-130 (CATHETERS) IMPLANT
CATH JOSEPH QUAD ALLRED 6F REP (CATHETERS) IMPLANT
ICD COBALT XT QUAD CRT DTPA2Q1 (ICD Generator) IMPLANT
KIT ESSENTIALS PG (KITS) IMPLANT
LEAD ATTAIN PERFORM ST 4398-88 (Lead) IMPLANT
LEAD CAPSURE NOVUS 5076-52CM (Lead) IMPLANT
LEAD SPRINT QUAT SEC 6935-65CM (Lead) IMPLANT
PAD DEFIB RADIO PHYSIO CONN (PAD) ×1 IMPLANT
SHEATH 7FR PRELUDE SNAP 13 (SHEATH) IMPLANT
SHEATH 9FR PRELUDE SNAP 13 (SHEATH) IMPLANT
TRAY PACEMAKER INSERTION (PACKS) ×1 IMPLANT
WIRE ACUITY WHISPER EDS 4648 (WIRE) IMPLANT

## 2024-04-16 NOTE — Progress Notes (Signed)
 Patient and patient son and sister in law given discharge instructions, education provided no further questions at this time. Patient able to ambulate before discharge. Able to tolerate PO intake. Patient site is clean, dry, intact with no hematoma noted upon discharge. Seen by MD, CXR preformed and read, Device rep came to bedside, and EKG preformed.

## 2024-04-16 NOTE — Interval H&P Note (Signed)
 History and Physical Interval Note:  04/16/2024 11:00 AM  Kevin Hampton  has presented today for surgery, with the diagnosis of cardiomyopathy.  The various methods of treatment have been discussed with the patient and family. After consideration of risks, benefits and other options for treatment, the patient has consented to  Procedure(s): BIV ICD INSERTION CRT-D (N/A) as a surgical intervention.  The patient's history has been reviewed, patient examined, no change in status, stable for surgery.  I have reviewed the patient's chart and labs.  Questions were answered to the patient's satisfaction.     Danelle Birmingham

## 2024-04-17 ENCOUNTER — Encounter (HOSPITAL_COMMUNITY): Payer: Self-pay | Admitting: Internal Medicine

## 2024-04-17 MED FILL — Midazolam HCl Inj 2 MG/2ML (Base Equivalent): INTRAMUSCULAR | Qty: 9 | Status: AC

## 2024-04-23 ENCOUNTER — Telehealth: Payer: Self-pay

## 2024-04-23 NOTE — Telephone Encounter (Signed)
 Follow-up after same day discharge: Implant date: 04/16/2024 MD: Danelle Birmingham, MD Device: ICD  Location: L chest    Wound check visit: 04/29/2024 90 day MD follow-up: 07/17/2024  Remote Transmission received:yes   Dressing/sling removed:   Confirm OAC restart on: LVM for pt to call back   Please continue to monitor your cardiac device site for redness, swelling, and drainage. Call the device clinic at [phone] if you experience these symptoms, fever/chills, or have questions about your device.   Remote monitoring is used to monitor your cardiac device from home. This monitoring is scheduled every 91 days by our office. It allows us  to keep an eye on the functioning of your device to ensure it is working properly.

## 2024-04-29 ENCOUNTER — Ambulatory Visit: Attending: Internal Medicine | Admitting: *Deleted

## 2024-04-29 DIAGNOSIS — I5022 Chronic systolic (congestive) heart failure: Secondary | ICD-10-CM | POA: Diagnosis not present

## 2024-04-29 DIAGNOSIS — I493 Ventricular premature depolarization: Secondary | ICD-10-CM | POA: Diagnosis not present

## 2024-04-29 DIAGNOSIS — I447 Left bundle-branch block, unspecified: Secondary | ICD-10-CM | POA: Diagnosis not present

## 2024-04-29 NOTE — Patient Instructions (Signed)

## 2024-05-01 LAB — CUP PACEART INCLINIC DEVICE CHECK
Date Time Interrogation Session: 20251118120000
Implantable Lead Connection Status: 753985
Implantable Lead Connection Status: 753985
Implantable Lead Connection Status: 753985
Implantable Lead Implant Date: 20251105
Implantable Lead Implant Date: 20251105
Implantable Lead Implant Date: 20251105
Implantable Lead Location: 753858
Implantable Lead Location: 753859
Implantable Lead Location: 753860
Implantable Lead Model: 4398
Implantable Lead Model: 5076
Implantable Pulse Generator Implant Date: 20251105

## 2024-05-01 NOTE — Progress Notes (Signed)
 Normal multi chamber ICD wound check. Wound well healed. Presenting rhythm: AS/VP. Routine testing performed. Thresholds, sensing, and impedance consistent with implant measurements with 3.5V safety margin/auto capture until 3 month visit. No treated arrhythmias. Reviewed arm restrictions to continue for 6 weeks total post op. Reviewed shock plan.  Pt enrolled in remote follow-up.  LV threshold tested 2.5 V @ 0.4 mS today. This RN pulled out the pulse width and got it down to 1.5 V @ 1.0 mS

## 2024-05-05 ENCOUNTER — Other Ambulatory Visit: Payer: Self-pay

## 2024-05-05 ENCOUNTER — Telehealth: Payer: Self-pay | Admitting: Nurse Practitioner

## 2024-05-05 ENCOUNTER — Telehealth: Payer: Self-pay

## 2024-05-05 ENCOUNTER — Other Ambulatory Visit: Payer: Self-pay | Admitting: Nurse Practitioner

## 2024-05-05 DIAGNOSIS — E119 Type 2 diabetes mellitus without complications: Secondary | ICD-10-CM

## 2024-05-05 NOTE — Telephone Encounter (Signed)
 Copied from CRM [id number]. Topic: Clinical - Medication Refill >> May 05, 2024  2:04 PM Emylou G wrote: Medication: apixaban  (ELIQUIS ) 5 MG TABS tablet  Has the patient contacted their pharmacy? No (Agent: If no, request that the patient contact the pharmacy for the refill. If patient does not wish to contact the pharmacy document the reason why and proceed with request.) (Agent: If yes, when and what did the pharmacy advise?) said to call us   This is the patient's preferred pharmacy:  [facility] [address] [place], [place] - [address] [place] [address] Phone: [phone] Fax: [phone]  Is this the correct pharmacy for this prescription? Yes If no, delete pharmacy and type the correct one.   Has the prescription been filled recently? No  Is the patient out of the medication? Yes  Has the patient been seen for an appointment in the last year OR does the patient have an upcoming appointment? Yes  Can we respond through MyChart? Yes  Agent: Please be advised that Rx refills may take up to 3 business days. We ask that you follow-up with your pharmacy.

## 2024-05-05 NOTE — Telephone Encounter (Signed)
 Copied from CRM #[id number]. Topic: Clinical - Medication Question >> May 05, 2024  2:09 PM Emylou G wrote: Reason for CRM: Patient interested in getting this glargine 21 units filled.. but it was originally started in [place] by Dr Ina.. can we fill?  Pls call patient if you need to review.

## 2024-05-05 NOTE — Telephone Encounter (Signed)
 [facility] rep confirmed Rx had refills and is currently ready to pick up. Reports will reach out to pt/computer will automate that Rx is ready for pick up.

## 2024-05-07 ENCOUNTER — Other Ambulatory Visit: Payer: Self-pay | Admitting: Nurse Practitioner

## 2024-05-19 ENCOUNTER — Other Ambulatory Visit: Payer: Self-pay | Admitting: Nurse Practitioner

## 2024-05-19 DIAGNOSIS — I10 Essential (primary) hypertension: Secondary | ICD-10-CM

## 2024-05-19 DIAGNOSIS — I5022 Chronic systolic (congestive) heart failure: Secondary | ICD-10-CM

## 2024-05-20 ENCOUNTER — Ambulatory Visit: Payer: Self-pay | Admitting: Student in an Organized Health Care Education/Training Program

## 2024-05-28 ENCOUNTER — Ambulatory Visit

## 2024-05-28 ENCOUNTER — Other Ambulatory Visit (HOSPITAL_COMMUNITY): Payer: Self-pay

## 2024-05-28 DIAGNOSIS — I5022 Chronic systolic (congestive) heart failure: Secondary | ICD-10-CM

## 2024-05-28 DIAGNOSIS — I10 Essential (primary) hypertension: Secondary | ICD-10-CM

## 2024-05-28 MED ORDER — SACUBITRIL-VALSARTAN 24-26 MG PO TABS: 1.0000 | ORAL_TABLET | Freq: Two times a day (BID) | ORAL | 1 refills | Status: AC

## 2024-05-29 LAB — CUP PACEART REMOTE DEVICE CHECK
Battery Remaining Longevity: 132 mo
Battery Voltage: 3.1 V
Brady Statistic AP VP Percent: 0.37 %
Brady Statistic AP VS Percent: 0.05 %
Brady Statistic AS VP Percent: 94.29 %
Brady Statistic AS VS Percent: 5.29 %
Brady Statistic RA Percent Paced: 0.6 %
Brady Statistic RV Percent Paced: 0.68 %
Date Time Interrogation Session: 20251216225739
HighPow Impedance: 62 Ohm
Implantable Lead Connection Status: 753985
Implantable Lead Connection Status: 753985
Implantable Lead Connection Status: 753985
Implantable Lead Implant Date: 20251105
Implantable Lead Implant Date: 20251105
Implantable Lead Implant Date: 20251105
Implantable Lead Location: 753858
Implantable Lead Location: 753859
Implantable Lead Location: 753860
Implantable Lead Model: 4398
Implantable Lead Model: 5076
Implantable Pulse Generator Implant Date: 20251105
Lead Channel Impedance Value: 323 Ohm
Lead Channel Impedance Value: 323 Ohm
Lead Channel Impedance Value: 323 Ohm
Lead Channel Impedance Value: 342 Ohm
Lead Channel Impedance Value: 380 Ohm
Lead Channel Impedance Value: 456 Ohm
Lead Channel Impedance Value: 570 Ohm
Lead Channel Impedance Value: 570 Ohm
Lead Channel Impedance Value: 570 Ohm
Lead Channel Impedance Value: 570 Ohm
Lead Channel Impedance Value: 589 Ohm
Lead Channel Impedance Value: 589 Ohm
Lead Channel Impedance Value: 608 Ohm
Lead Channel Pacing Threshold Amplitude: 0.5 V
Lead Channel Pacing Threshold Amplitude: 0.5 V
Lead Channel Pacing Threshold Amplitude: 1.375 V
Lead Channel Pacing Threshold Pulse Width: 0.4 ms
Lead Channel Pacing Threshold Pulse Width: 0.4 ms
Lead Channel Pacing Threshold Pulse Width: 0.4 ms
Lead Channel Sensing Intrinsic Amplitude: 19.8 mV
Lead Channel Sensing Intrinsic Amplitude: 4.9 mV
Lead Channel Setting Pacing Amplitude: 2 V
Lead Channel Setting Pacing Amplitude: 3.5 V
Lead Channel Setting Pacing Amplitude: 3.5 V
Lead Channel Setting Pacing Pulse Width: 0.4 ms
Lead Channel Setting Pacing Pulse Width: 0.4 ms
Lead Channel Setting Sensing Sensitivity: 0.3 mV
Zone Setting Status: 755011
Zone Setting Status: 755011
Zone Setting Status: 755011

## 2024-05-29 NOTE — Progress Notes (Signed)
 Remote ICD Transmission

## 2024-06-06 ENCOUNTER — Ambulatory Visit: Payer: Self-pay | Admitting: Student in an Organized Health Care Education/Training Program

## 2024-06-18 ENCOUNTER — Other Ambulatory Visit: Payer: Self-pay | Admitting: Nurse Practitioner

## 2024-06-18 DIAGNOSIS — E119 Type 2 diabetes mellitus without complications: Secondary | ICD-10-CM

## 2024-06-19 NOTE — Telephone Encounter (Signed)
 Requested Prescriptions  Pending Prescriptions Disp Refills   glipiZIDE  (GLUCOTROL ) 10 MG tablet [Pharmacy Med Name: glipiZIDE  10 MG Oral Tablet] 180 tablet 0    Sig: TAKE 1 TABLET BY MOUTH TWICE DAILY BEFORE A MEAL     Endocrinology:  Diabetes - Sulfonylureas Failed - 06/19/2024  2:37 PM      Failed - HBA1C is between 0 and 7.9 and within 180 days    Hemoglobin A1C  Date Value Ref Range Status  02/26/2024 9.3 (A) 4.0 - 5.6 % Corrected   HbA1c, POC (controlled diabetic range)  Date Value Ref Range Status  11/19/2020 7.1 (A) 0.0 - 7.0 % Final   HbA1c POC (<> result, manual entry)  Date Value Ref Range Status  02/08/2022 >15 4.0 - 5.6 % Final    Comment:    Greater than 15   Hgb A1c MFr Bld  Date Value Ref Range Status  11/15/2023 14.2 (H) 4.8 - 5.6 % Final    Comment:             Prediabetes: 5.7 - 6.4          Diabetes: >6.4          Glycemic control for adults with diabetes: <7.0          Failed - Cr in normal range and within 360 days    Creatinine, Ser  Date Value Ref Range Status  03/28/2024 1.46 (H) 0.76 - 1.27 mg/dL Final         Passed - Valid encounter within last 6 months    Recent Outpatient Visits           3 months ago Type 2 diabetes mellitus with hyperglycemia, without long-term current use of insulin  (HCC)   [facility] [place]. [facility] Theotis Haze ORN, NP   6 months ago Primary hypertension   [facility] Of [place]. [facility] Theotis Haze ORN, NP   10 months ago Diabetes mellitus with insulin  therapy [facility])   [facility] Dept Of [place]. [facility] [place], [place] W, NP   1 year ago Type 2 diabetes mellitus with hyperglycemia, without long-term current use of insulin  [facility])   [facility] Of [place]. [facility] [place], [place] W, NP   2 years ago Type 2 diabetes mellitus with hyperglycemia,  without long-term current use of insulin  [facility])   [facility] - A Dept Of [place]. [facility] [place], [place], PA-C

## 2024-06-25 ENCOUNTER — Telehealth: Payer: Self-pay | Admitting: Nurse Practitioner

## 2024-06-25 NOTE — Telephone Encounter (Signed)
 Contacted pt left vm to confirmed appt (per vr)

## 2024-06-27 ENCOUNTER — Encounter: Payer: Self-pay | Admitting: Nurse Practitioner

## 2024-06-27 ENCOUNTER — Ambulatory Visit: Attending: Nurse Practitioner | Admitting: Nurse Practitioner

## 2024-06-27 VITALS — BP 137/97 | HR 86 | Ht 76.0 in | Wt 276.4 lb

## 2024-06-27 DIAGNOSIS — I5022 Chronic systolic (congestive) heart failure: Secondary | ICD-10-CM

## 2024-06-27 DIAGNOSIS — I10 Essential (primary) hypertension: Secondary | ICD-10-CM

## 2024-06-27 DIAGNOSIS — E119 Type 2 diabetes mellitus without complications: Secondary | ICD-10-CM

## 2024-06-27 LAB — POCT GLYCOSYLATED HEMOGLOBIN (HGB A1C): Hemoglobin A1C: 9.1 % — AB (ref 4.0–5.6)

## 2024-06-27 MED ORDER — LANTUS SOLOSTAR 100 UNIT/ML ~~LOC~~ SOPN: 30.0000 [IU] | PEN_INJECTOR | Freq: Every day | SUBCUTANEOUS | 99 refills | Status: AC

## 2024-06-27 MED ORDER — GLIPIZIDE 10 MG PO TABS: 10.0000 mg | ORAL_TABLET | Freq: Two times a day (BID) | ORAL | 1 refills | Status: AC

## 2024-06-27 MED ORDER — APIXABAN 5 MG PO TABS: 5.0000 mg | ORAL_TABLET | Freq: Two times a day (BID) | ORAL | 3 refills | Status: AC

## 2024-06-27 MED ORDER — SPIRONOLACTONE 25 MG PO TABS: 25.0000 mg | ORAL_TABLET | Freq: Every day | ORAL | 1 refills | Status: AC

## 2024-06-27 MED ORDER — INSULIN LISPRO (1 UNIT DIAL) 100 UNIT/ML (KWIKPEN): 2.0000 [IU] | PEN_INJECTOR | Freq: Three times a day (TID) | SUBCUTANEOUS | 1 refills | Status: AC

## 2024-06-27 NOTE — Progress Notes (Unsigned)
 "  Assessment & Plan:  Kevin Hampton was seen today for diabetes.  Diagnoses and all orders for this visit:  Diabetes mellitus with insulin  therapy (HCC) -     POCT glycosylated hemoglobin (Hb A1C) -     insulin  glargine (LANTUS  SOLOSTAR) 100 UNIT/ML Solostar Pen; Inject 30 Units into the skin daily. -     insulin  lispro (HUMALOG  KWIKPEN) 100 UNIT/ML KwikPen; Inject 2 Units into the skin with breakfast, with lunch, and with evening meal. For blood sugars 0-150 give 0 units of insulin , 151-200 give 2 units of insulin , 201-250 give 4 units, 251-300 give 6 units, 301-350 give 8 units, 351-400 give 10 units,> 400 give 12 units and call M.D. Discussed hypoglycemia protocol. -     glipiZIDE  (GLUCOTROL ) 10 MG tablet; Take 1 tablet (10 mg total) by mouth 2 (two) times daily before a meal. -     CMP14+EGFR  Primary hypertension -     spironolactone  (ALDACTONE ) 25 MG tablet; Take 1 tablet (25 mg total) by mouth daily.  Chronic systolic heart failure (HCC) -     spironolactone  (ALDACTONE ) 25 MG tablet; Take 1 tablet (25 mg total) by mouth daily.  Other orders -     apixaban  (ELIQUIS ) 5 MG TABS tablet; Take 1 tablet (5 mg total) by mouth 2 (two) times daily.    Patient has been counseled on age-appropriate routine health concerns for screening and prevention. These are reviewed and up-to-date. Referrals have been placed accordingly. Immunizations are up-to-date or declined.    Subjective:   Chief Complaint  Patient presents with   Diabetes    Kevin Hampton 59 y.o. male presents to office today    Doing 30 units of insuluy  Fast acting 7 units but not with all meals.  Give one more quarter befreo adjsuting medication Wants to work on a1c Lab Results  Component Value Date   HGBA1C 9.1 (A) 06/27/2024     BP Readings from Last 3 Encounters:  06/27/24 (!) 145/88  04/16/24 123/84  03/28/24 124/87    HPI  ROS  Past Medical History:  Diagnosis Date   Acromegaly (HCC)    CHF  (congestive heart failure) (HCC)    Essential hypertension    Hypertensive emergency 12/15/2018   Nonischemic cardiomyopathy (HCC)    Obesity    Pituitary macroadenoma (HCC)    Stroke (HCC)    Systolic heart failure (HCC)    Type 2 diabetes mellitus with complication [facility])     Past Surgical History:  Procedure Laterality Date   BIV ICD INSERTION CRT-D N/A 04/16/2024   Procedure: BIV ICD INSERTION CRT-D;  Surgeon: Waddell Danelle ORN, MD;  Location: [facility] INVASIVE CV LAB;  Service: Cardiovascular;  Laterality: N/A;   CRANIOTOMY N/A 05/26/2020   Procedure: ENDOSCOPIC TRANSPHENOIDAL RESECTION OF TUMOR;  Surgeon: Debby Dorn MATSU, MD;  Location: [facility] OR;  Service: Neurosurgery;  Laterality: N/A;   RIGHT/LEFT HEART CATH AND CORONARY ANGIOGRAPHY N/A 12/16/2018   Procedure: RIGHT/LEFT HEART CATH AND CORONARY ANGIOGRAPHY;  Surgeon: Court Dorn PARAS, MD;  Location: [facility] INVASIVE CV LAB;  Service: Cardiovascular;  Laterality: N/A;   TRANSPHENOIDAL APPROACH EXPOSURE N/A 05/26/2020   Procedure: TRANSPHENOIDAL APPROACH EXPOSURE;  Surgeon: Mable Lenis, MD;  Location: [facility] OR;  Service: ENT;  Laterality: N/A;    Family History  Problem Relation Age of Onset   Hypertension Mother    Heart attack Mother    Hypertension Father    Hypertension Sister    Hypertension Brother  Social History Reviewed with no changes to be made today.   Outpatient Medications Prior to Visit  Medication Sig Dispense Refill   acetaminophen  (TYLENOL ) 500 MG tablet Take 500-1,000 mg by mouth every 6 (six) hours as needed (pain.).     amiodarone  (PACERONE ) 200 MG tablet Take 0.5 tablets (100 mg total) by mouth daily. 45 tablet 1   Continuous Glucose Receiver (FREESTYLE LIBRE 3 READER) DEVI Please check blood glucose z79.84 1 each 0   Continuous Glucose Sensor (FREESTYLE LIBRE 3 PLUS SENSOR) MISC Change sensor every 15 days. Z79.84 2 each 6   diphenhydrAMINE (BENADRYL) 12.5 MG/5ML liquid Take 12.5-25 mg by mouth at bedtime as  needed (sinus issues/allergies.).     Insulin  Pen Needle (PEN NEEDLES) 31G X 5 MM MISC 1 each by Does not apply route daily. 100 each 11   sacubitril -valsartan  (ENTRESTO ) 24-26 MG Take 1 tablet by mouth 2 (two) times daily. 180 tablet 1   apixaban  (ELIQUIS ) 5 MG TABS tablet Take 1 tablet (5 mg total) by mouth 2 (two) times daily. 60 tablet 11   glipiZIDE  (GLUCOTROL ) 10 MG tablet TAKE 1 TABLET BY MOUTH TWICE DAILY BEFORE A MEAL 180 tablet 0   insulin  glargine (LANTUS  SOLOSTAR) 100 UNIT/ML Solostar Pen Inject 30 Units into the skin daily. (Patient taking differently: Inject 21 Units into the skin at bedtime.) 15 mL PRN   insulin  lispro (HUMALOG  KWIKPEN) 100 UNIT/ML KwikPen Inject 2 Units into the skin with breakfast, with lunch, and with evening meal. For blood sugars 0-150 give 0 units of insulin , 151-200 give 2 units of insulin , 201-250 give 4 units, 251-300 give 6 units, 301-350 give 8 units, 351-400 give 10 units,> 400 give 12 units and call M.D. Discussed hypoglycemia protocol. 9 mL 1   spironolactone  (ALDACTONE ) 25 MG tablet Take 1 tablet by mouth once daily 90 tablet 0   No facility-administered medications prior to visit.    Allergies[1]     Objective:    BP (!) 145/88 (BP Location: Left Arm, Patient Position: Sitting, Cuff Size: Normal)   Pulse 86   Ht 6' 4 (1.93 m)   Wt 276 lb 6.4 oz (125.4 kg)   SpO2 98%   BMI 33.64 kg/m  Wt Readings from Last 3 Encounters:  06/27/24 276 lb 6.4 oz (125.4 kg)  04/16/24 258 lb (117 kg)  03/28/24 269 lb (122 kg)    Physical Exam       Patient has been counseled extensively about nutrition and exercise as well as the importance of adherence with medications and regular follow-up. The patient was given clear instructions to go to ER or return to medical center if symptoms don't improve, worsen or new problems develop. The patient verbalized understanding.   Follow-up: Return in about 3 months (around 10/09/2024).   Haze LELON Servant,  FNP-BC [facility] and [facility] [place], [place] [phone]   06/27/2024, 11:16 AM    [1]  Allergies Allergen Reactions   Coreg  [Carvedilol ] Other (See Comments)    Brings heart rate and blood pressure down severly   Farxiga  [Dapagliflozin ] Nausea Only   "

## 2024-06-28 ENCOUNTER — Ambulatory Visit: Payer: Self-pay | Admitting: Nurse Practitioner

## 2024-06-28 DIAGNOSIS — E875 Hyperkalemia: Secondary | ICD-10-CM

## 2024-06-28 LAB — CMP14+EGFR
ALT: 17 IU/L (ref 0–44)
AST: 18 IU/L (ref 0–40)
Albumin: 4.6 g/dL (ref 3.8–4.9)
Alkaline Phosphatase: 81 IU/L (ref 47–123)
BUN/Creatinine Ratio: 12 (ref 10–24)
BUN: 18 mg/dL (ref 8–27)
Bilirubin Total: 0.7 mg/dL (ref 0.0–1.2)
CO2: 24 mmol/L (ref 20–29)
Calcium: 10.8 mg/dL — ABNORMAL HIGH (ref 8.6–10.2)
Chloride: 100 mmol/L (ref 96–106)
Creatinine, Ser: 1.53 mg/dL — ABNORMAL HIGH (ref 0.76–1.27)
Globulin, Total: 2.8 g/dL (ref 1.5–4.5)
Glucose: 171 mg/dL — ABNORMAL HIGH (ref 70–99)
Potassium: 5.9 mmol/L — ABNORMAL HIGH (ref 3.5–5.2)
Sodium: 140 mmol/L (ref 134–144)
Total Protein: 7.4 g/dL (ref 6.0–8.5)
eGFR: 52 mL/min/1.73 — ABNORMAL LOW

## 2024-06-28 MED ORDER — LOKELMA 10 G PO PACK: 10.0000 g | PACK | Freq: Three times a day (TID) | ORAL | 0 refills | Status: AC

## 2024-06-28 NOTE — Progress Notes (Signed)
 Potassium is too high. We will need to send a medication in for you to take to help lower. Take this for one day only. Make sure you are drinking plenty of water. 80oz.  Repeat potassium here in lab when you leave your cardiology office visit.   Labs show chronic kidney disease. Stage 3a. Mild and stable at this time. We will continue to keep our eye on your levels. If GFR drops below 50 will need to refer to kidney specialist.  Ways to manage include good blood pressure control. Healthy diet and exercise.

## 2024-07-01 ENCOUNTER — Other Ambulatory Visit: Payer: Self-pay

## 2024-07-15 ENCOUNTER — Other Ambulatory Visit (HOSPITAL_COMMUNITY): Payer: Self-pay

## 2024-07-17 ENCOUNTER — Ambulatory Visit: Admitting: Physician Assistant

## 2024-08-05 ENCOUNTER — Ambulatory Visit: Admitting: Student

## 2024-08-27 ENCOUNTER — Ambulatory Visit

## 2024-10-10 ENCOUNTER — Ambulatory Visit: Payer: Self-pay | Admitting: Nurse Practitioner

## 2024-11-26 ENCOUNTER — Ambulatory Visit

## 2025-02-25 ENCOUNTER — Ambulatory Visit
